# Patient Record
Sex: Female | Born: 1960 | Race: White | Hispanic: No | Marital: Married | Smoking: Never smoker
Health system: Southern US, Community
[De-identification: ages and names within clinical notes are randomized; demographics above are authoritative.]

## PROBLEM LIST (undated history)

## (undated) DIAGNOSIS — A048 Other specified bacterial intestinal infections: Secondary | ICD-10-CM

## (undated) DIAGNOSIS — Z860100 Personal history of colon polyps, unspecified: Secondary | ICD-10-CM

## (undated) DIAGNOSIS — K219 Gastro-esophageal reflux disease without esophagitis: Secondary | ICD-10-CM

## (undated) DIAGNOSIS — I10 Essential (primary) hypertension: Secondary | ICD-10-CM

## (undated) DIAGNOSIS — K2270 Barrett's esophagus without dysplasia: Secondary | ICD-10-CM

## (undated) DIAGNOSIS — T7840XA Allergy, unspecified, initial encounter: Secondary | ICD-10-CM

## (undated) HISTORY — DX: Gastro-esophageal reflux disease without esophagitis: K21.9

## (undated) HISTORY — PX: TUBOPLASTY / TUBOTUBAL ANASTOMOSIS: SUR1392

## (undated) HISTORY — PX: TOTAL ABDOMINAL HYSTERECTOMY W/ BILATERAL SALPINGOOPHORECTOMY: SHX83

## (undated) HISTORY — DX: Personal history of colonic polyps: Z86.010

## (undated) HISTORY — DX: Essential (primary) hypertension: I10

## (undated) HISTORY — PX: DIAGNOSTIC LAPAROSCOPY: SUR761

## (undated) HISTORY — DX: Allergy, unspecified, initial encounter: T78.40XA

## (undated) HISTORY — PX: DILATION AND CURETTAGE OF UTERUS: SHX78

## (undated) HISTORY — PX: OTHER SURGICAL HISTORY: SHX169

## (undated) HISTORY — DX: Personal history of colon polyps, unspecified: Z86.0100

## (undated) HISTORY — DX: Other specified bacterial intestinal infections: A04.8

## (undated) HISTORY — DX: Barrett's esophagus without dysplasia: K22.70

## (undated) HISTORY — PX: ABDOMINAL HYSTERECTOMY: SHX81

---

## 1998-02-13 ENCOUNTER — Inpatient Hospital Stay (HOSPITAL_COMMUNITY): Admission: AD | Admit: 1998-02-13 | Discharge: 1998-02-13 | Payer: Self-pay | Admitting: Obstetrics and Gynecology

## 1998-03-04 ENCOUNTER — Ambulatory Visit (HOSPITAL_COMMUNITY): Admission: RE | Admit: 1998-03-04 | Discharge: 1998-03-04 | Payer: Self-pay | Admitting: Obstetrics and Gynecology

## 1998-03-09 ENCOUNTER — Ambulatory Visit (HOSPITAL_COMMUNITY): Admission: RE | Admit: 1998-03-09 | Discharge: 1998-03-09 | Payer: Self-pay | Admitting: Obstetrics and Gynecology

## 1998-03-12 ENCOUNTER — Inpatient Hospital Stay (HOSPITAL_COMMUNITY): Admission: AD | Admit: 1998-03-12 | Discharge: 1998-03-12 | Payer: Self-pay | Admitting: Obstetrics and Gynecology

## 1998-03-13 ENCOUNTER — Ambulatory Visit (HOSPITAL_COMMUNITY): Admission: RE | Admit: 1998-03-13 | Discharge: 1998-03-13 | Payer: Self-pay | Admitting: Obstetrics and Gynecology

## 1998-03-29 ENCOUNTER — Other Ambulatory Visit: Admission: RE | Admit: 1998-03-29 | Discharge: 1998-03-29 | Payer: Self-pay | Admitting: Obstetrics and Gynecology

## 1998-04-30 ENCOUNTER — Ambulatory Visit (HOSPITAL_COMMUNITY): Admission: AD | Admit: 1998-04-30 | Discharge: 1998-04-30 | Payer: Self-pay | Admitting: Obstetrics and Gynecology

## 1998-05-03 ENCOUNTER — Inpatient Hospital Stay (HOSPITAL_COMMUNITY): Admission: AD | Admit: 1998-05-03 | Discharge: 1998-05-03 | Payer: Self-pay | Admitting: Obstetrics and Gynecology

## 1998-05-03 ENCOUNTER — Ambulatory Visit (HOSPITAL_COMMUNITY): Admission: RE | Admit: 1998-05-03 | Discharge: 1998-05-03 | Payer: Self-pay | Admitting: Obstetrics and Gynecology

## 1998-05-10 ENCOUNTER — Inpatient Hospital Stay (HOSPITAL_COMMUNITY): Admission: AD | Admit: 1998-05-10 | Discharge: 1998-05-10 | Payer: Self-pay | Admitting: Obstetrics and Gynecology

## 1998-06-28 ENCOUNTER — Ambulatory Visit (HOSPITAL_COMMUNITY): Admission: RE | Admit: 1998-06-28 | Discharge: 1998-06-28 | Payer: Self-pay | Admitting: Obstetrics and Gynecology

## 1998-06-28 ENCOUNTER — Inpatient Hospital Stay (HOSPITAL_COMMUNITY): Admission: AD | Admit: 1998-06-28 | Discharge: 1998-06-28 | Payer: Self-pay | Admitting: Obstetrics and Gynecology

## 1998-07-05 ENCOUNTER — Inpatient Hospital Stay (HOSPITAL_COMMUNITY): Admission: AD | Admit: 1998-07-05 | Discharge: 1998-07-05 | Payer: Self-pay | Admitting: Obstetrics and Gynecology

## 1998-08-16 ENCOUNTER — Encounter: Admission: RE | Admit: 1998-08-16 | Discharge: 1998-11-14 | Payer: Self-pay | Admitting: Gynecology

## 1998-08-22 ENCOUNTER — Ambulatory Visit (HOSPITAL_COMMUNITY): Admission: RE | Admit: 1998-08-22 | Discharge: 1998-08-22 | Payer: Self-pay | Admitting: Gynecology

## 1998-10-30 ENCOUNTER — Ambulatory Visit (HOSPITAL_COMMUNITY): Admission: RE | Admit: 1998-10-30 | Discharge: 1998-10-30 | Payer: Self-pay | Admitting: Obstetrics and Gynecology

## 1998-11-02 ENCOUNTER — Ambulatory Visit (HOSPITAL_COMMUNITY): Admission: RE | Admit: 1998-11-02 | Discharge: 1998-11-02 | Payer: Self-pay | Admitting: Obstetrics and Gynecology

## 1998-11-03 ENCOUNTER — Inpatient Hospital Stay (HOSPITAL_COMMUNITY): Admission: RE | Admit: 1998-11-03 | Discharge: 1998-11-03 | Payer: Self-pay | Admitting: Obstetrics and Gynecology

## 1998-11-10 ENCOUNTER — Inpatient Hospital Stay (HOSPITAL_COMMUNITY): Admission: AD | Admit: 1998-11-10 | Discharge: 1998-11-10 | Payer: Self-pay | Admitting: Obstetrics and Gynecology

## 1998-11-23 ENCOUNTER — Other Ambulatory Visit: Admission: RE | Admit: 1998-11-23 | Discharge: 1998-11-23 | Payer: Self-pay | Admitting: Obstetrics and Gynecology

## 1998-11-29 ENCOUNTER — Ambulatory Visit (HOSPITAL_COMMUNITY): Admission: RE | Admit: 1998-11-29 | Discharge: 1998-11-29 | Payer: Self-pay | Admitting: Obstetrics and Gynecology

## 1998-12-04 ENCOUNTER — Ambulatory Visit (HOSPITAL_COMMUNITY): Admission: RE | Admit: 1998-12-04 | Discharge: 1998-12-04 | Payer: Self-pay | Admitting: Obstetrics and Gynecology

## 1998-12-04 ENCOUNTER — Inpatient Hospital Stay (HOSPITAL_COMMUNITY): Admission: AD | Admit: 1998-12-04 | Discharge: 1998-12-04 | Payer: Self-pay | Admitting: Obstetrics and Gynecology

## 1998-12-11 ENCOUNTER — Inpatient Hospital Stay (HOSPITAL_COMMUNITY): Admission: AD | Admit: 1998-12-11 | Discharge: 1998-12-11 | Payer: Self-pay | Admitting: Obstetrics and Gynecology

## 1998-12-27 ENCOUNTER — Ambulatory Visit (HOSPITAL_COMMUNITY): Admission: RE | Admit: 1998-12-27 | Discharge: 1998-12-27 | Payer: Self-pay | Admitting: Obstetrics and Gynecology

## 1998-12-30 ENCOUNTER — Ambulatory Visit (HOSPITAL_COMMUNITY): Admission: RE | Admit: 1998-12-30 | Discharge: 1998-12-30 | Payer: Self-pay | Admitting: Obstetrics and Gynecology

## 1998-12-31 ENCOUNTER — Inpatient Hospital Stay (HOSPITAL_COMMUNITY): Admission: AD | Admit: 1998-12-31 | Discharge: 1998-12-31 | Payer: Self-pay | Admitting: Obstetrics and Gynecology

## 1999-02-04 ENCOUNTER — Inpatient Hospital Stay (HOSPITAL_COMMUNITY): Admission: AD | Admit: 1999-02-04 | Discharge: 1999-02-04 | Payer: Self-pay | Admitting: Obstetrics and Gynecology

## 1999-02-07 ENCOUNTER — Other Ambulatory Visit: Admission: RE | Admit: 1999-02-07 | Discharge: 1999-02-07 | Payer: Self-pay | Admitting: Gynecology

## 1999-02-14 ENCOUNTER — Encounter: Admission: RE | Admit: 1999-02-14 | Discharge: 1999-05-15 | Payer: Self-pay | Admitting: Gynecology

## 1999-04-24 ENCOUNTER — Encounter: Payer: Self-pay | Admitting: Obstetrics and Gynecology

## 1999-04-24 ENCOUNTER — Inpatient Hospital Stay (HOSPITAL_COMMUNITY): Admission: AD | Admit: 1999-04-24 | Discharge: 1999-04-27 | Payer: Self-pay | Admitting: Gynecology

## 1999-06-10 ENCOUNTER — Inpatient Hospital Stay (HOSPITAL_COMMUNITY): Admission: AD | Admit: 1999-06-10 | Discharge: 1999-08-22 | Payer: Self-pay | Admitting: Gynecology

## 1999-06-10 ENCOUNTER — Encounter: Payer: Self-pay | Admitting: Gynecology

## 1999-06-18 ENCOUNTER — Encounter: Payer: Self-pay | Admitting: Obstetrics and Gynecology

## 1999-06-25 ENCOUNTER — Encounter: Payer: Self-pay | Admitting: Gynecology

## 1999-07-02 ENCOUNTER — Encounter: Payer: Self-pay | Admitting: Gynecology

## 1999-07-09 ENCOUNTER — Encounter: Payer: Self-pay | Admitting: Obstetrics and Gynecology

## 1999-07-16 ENCOUNTER — Encounter: Payer: Self-pay | Admitting: Gynecology

## 1999-07-24 ENCOUNTER — Encounter: Payer: Self-pay | Admitting: Gynecology

## 1999-07-30 ENCOUNTER — Encounter: Payer: Self-pay | Admitting: Gynecology

## 1999-08-06 ENCOUNTER — Encounter: Payer: Self-pay | Admitting: Gynecology

## 1999-08-13 ENCOUNTER — Encounter: Payer: Self-pay | Admitting: Gynecology

## 1999-08-23 ENCOUNTER — Encounter (HOSPITAL_COMMUNITY): Admission: RE | Admit: 1999-08-23 | Discharge: 1999-11-21 | Payer: Self-pay | Admitting: Gynecology

## 1999-10-04 ENCOUNTER — Other Ambulatory Visit: Admission: RE | Admit: 1999-10-04 | Discharge: 1999-10-04 | Payer: Self-pay | Admitting: Gynecology

## 1999-11-15 ENCOUNTER — Observation Stay (HOSPITAL_COMMUNITY): Admission: RE | Admit: 1999-11-15 | Discharge: 1999-11-16 | Payer: Self-pay | Admitting: Gynecology

## 1999-11-15 ENCOUNTER — Encounter (INDEPENDENT_AMBULATORY_CARE_PROVIDER_SITE_OTHER): Payer: Self-pay

## 2001-02-03 ENCOUNTER — Other Ambulatory Visit: Admission: RE | Admit: 2001-02-03 | Discharge: 2001-02-03 | Payer: Self-pay | Admitting: Gynecology

## 2002-03-26 ENCOUNTER — Other Ambulatory Visit: Admission: RE | Admit: 2002-03-26 | Discharge: 2002-03-26 | Payer: Self-pay | Admitting: Gynecology

## 2003-03-23 ENCOUNTER — Other Ambulatory Visit: Admission: RE | Admit: 2003-03-23 | Discharge: 2003-03-23 | Payer: Self-pay | Admitting: Gynecology

## 2003-11-22 ENCOUNTER — Inpatient Hospital Stay (HOSPITAL_COMMUNITY): Admission: RE | Admit: 2003-11-22 | Discharge: 2003-11-25 | Payer: Self-pay | Admitting: Gynecology

## 2003-11-22 ENCOUNTER — Encounter (INDEPENDENT_AMBULATORY_CARE_PROVIDER_SITE_OTHER): Payer: Self-pay | Admitting: Specialist

## 2004-05-07 ENCOUNTER — Other Ambulatory Visit: Admission: RE | Admit: 2004-05-07 | Discharge: 2004-05-07 | Payer: Self-pay | Admitting: Gynecology

## 2004-05-07 ENCOUNTER — Ambulatory Visit (HOSPITAL_COMMUNITY): Admission: RE | Admit: 2004-05-07 | Discharge: 2004-05-07 | Payer: Self-pay | Admitting: Gynecology

## 2005-05-16 ENCOUNTER — Other Ambulatory Visit: Admission: RE | Admit: 2005-05-16 | Discharge: 2005-05-16 | Payer: Self-pay | Admitting: Gynecology

## 2005-05-28 ENCOUNTER — Ambulatory Visit (HOSPITAL_COMMUNITY): Admission: RE | Admit: 2005-05-28 | Discharge: 2005-05-28 | Payer: Self-pay | Admitting: Gynecology

## 2006-06-03 ENCOUNTER — Ambulatory Visit (HOSPITAL_COMMUNITY): Admission: RE | Admit: 2006-06-03 | Discharge: 2006-06-03 | Payer: Self-pay | Admitting: Gynecology

## 2006-06-03 ENCOUNTER — Other Ambulatory Visit: Admission: RE | Admit: 2006-06-03 | Discharge: 2006-06-03 | Payer: Self-pay | Admitting: Gynecology

## 2007-06-09 ENCOUNTER — Ambulatory Visit (HOSPITAL_COMMUNITY): Admission: RE | Admit: 2007-06-09 | Discharge: 2007-06-09 | Payer: Self-pay | Admitting: Gynecology

## 2007-06-09 ENCOUNTER — Other Ambulatory Visit: Admission: RE | Admit: 2007-06-09 | Discharge: 2007-06-09 | Payer: Self-pay | Admitting: Gynecology

## 2008-06-09 ENCOUNTER — Ambulatory Visit (HOSPITAL_COMMUNITY): Admission: RE | Admit: 2008-06-09 | Discharge: 2008-06-09 | Payer: Self-pay | Admitting: Gynecology

## 2008-06-09 ENCOUNTER — Other Ambulatory Visit: Admission: RE | Admit: 2008-06-09 | Discharge: 2008-06-09 | Payer: Self-pay | Admitting: Gynecology

## 2009-06-13 ENCOUNTER — Other Ambulatory Visit: Admission: RE | Admit: 2009-06-13 | Discharge: 2009-06-13 | Payer: Self-pay | Admitting: Gynecology

## 2009-06-13 ENCOUNTER — Ambulatory Visit: Payer: Self-pay | Admitting: Gynecology

## 2009-06-13 ENCOUNTER — Ambulatory Visit (HOSPITAL_COMMUNITY): Admission: RE | Admit: 2009-06-13 | Discharge: 2009-06-13 | Payer: Self-pay | Admitting: Gynecology

## 2009-06-13 ENCOUNTER — Encounter: Payer: Self-pay | Admitting: Gynecology

## 2009-10-25 ENCOUNTER — Ambulatory Visit: Payer: Self-pay | Admitting: Gynecology

## 2010-07-04 ENCOUNTER — Ambulatory Visit: Payer: Self-pay | Admitting: Gynecology

## 2010-07-04 ENCOUNTER — Other Ambulatory Visit: Admission: RE | Admit: 2010-07-04 | Discharge: 2010-07-04 | Payer: Self-pay | Admitting: Gynecology

## 2010-07-04 ENCOUNTER — Ambulatory Visit (HOSPITAL_COMMUNITY): Admission: RE | Admit: 2010-07-04 | Discharge: 2010-07-04 | Payer: Self-pay | Admitting: Gynecology

## 2010-11-22 ENCOUNTER — Ambulatory Visit
Admission: RE | Admit: 2010-11-22 | Discharge: 2010-11-22 | Payer: Self-pay | Source: Home / Self Care | Attending: Gynecology | Admitting: Gynecology

## 2010-12-09 ENCOUNTER — Encounter: Payer: Self-pay | Admitting: Gynecology

## 2011-04-05 NOTE — Op Note (Signed)
NAME:  Desiree Holmes, Desiree Holmes                      ACCOUNT NO.:  [id number]   MEDICAL RECORD NO.:  [id number]                   PATIENT TYPE:  INP   LOCATION:  9199                                 FACILITY:  [facility]   PHYSICIAN:  Juan H. Lily Peer, M.D.             DATE OF BIRTH:  [birth year]   DATE OF PROCEDURE:  11/22/2003  DATE OF DISCHARGE:                                 OPERATIVE REPORT   PREOPERATIVE DIAGNOSES:  1. Dysmenorrhea.  2. Menorrhagia.  3. Leiomyomata uteri.  4. Adenomyosis.  5. Right ovarian cysts.   POSTOPERATIVE DIAGNOSES:  1. Dysmenorrhea.  2. Menorrhagia.  3. Leiomyomata uteri.  4. Adenomyosis.  5. Right ovarian cysts.   ANESTHESIA:  General endotracheal anesthesia.   SURGEON:  Juan H. Lily Peer, M.D.   FIRST ASSISTANT:  Timothy P. Fontaine, M.D.   PROCEDURE PERFORMED:  Total abdominal hysterectomy with bilateral salpingo-  oophorectomy.   INDICATION FOR OPERATION:  The patient is a 50 year old gravida 3, para 1,  AB 2, with chronic pelvic pain, dysmenorrhea, menorrhagia, and left ovarian  cyst.   FINDINGS:  Pelvic adhesions.  Small right hemorrhagic ovarian cyst.  Irregular-shaped uterus.  Normal left ovary.   DESCRIPTION OF OPERATION:  After the patient was adequately counseled she  was taken to the operating room, where she underwent successful general  endotracheal anesthesia.  The patient received 1 g of Cefotan prophylaxis  prior to commencement of the operation.  She also had PAS stockings for DVT  prophylaxis.  The abdomen was prepped and draped in the usual sterile  fashion.  After successful general endotracheal anesthesia, 0.25% Marcaine  was infiltrated in the subcutaneous tissue along the Pfannenstiel incision  site for 10 mL.  The incision was carried down adjacent to a previous  Pfannenstiel scar, and this incision was carried down through the skin and  subcutaneous tissue down to the rectus fascia, whereby a midline nick was  made.  The  fascia was incised in a transverse fashion.  The midline raphe  was entered cautiously, the peritoneal cavity was entered.  The patient was  placed in the Trendelenburg position. O'Connor-O'Sullivan retractors were  placed.  Bilateral pelvic adhesiolysis from the pelvic sidewalls.  Heaney  clamps were utilized to put traction on both triple pedicles.  Identification of both ureters was successful.  The right round ligament was  suture ligated with 0 Vicryl suture and then transected and the anterior  broad ligament was incised to the level of the internal cervical os.  With  the surgeon's finger, the posterior broad ligament was penetrated and the  infundibulopelvic ligament was clamped, cut, and suture ligated, first with  a free tie, followed by a transfixion stitch.  A similar procedure was  carried down on the contralateral side after skeletonization of the uterus.  The remainder of the broad ligament and cardinal ligament were clamped, cut,  and suture ligated with 0 Vicryl suture to the level  of both vaginal  fornices, whereby a curved Heaney clamp was utilized to incise the vaginal-  cervical region.  With the use of Jorgenson scissors, the cervix was removed  off the vagina and the cervix, uterus, tubes, and ovaries were passed off  the operative field.  Both angles were secured with a transfixion stitch of  0 Vicryl suture and the remaining vaginal cuff was closed with interrupted  stitch of 0 Vicryl suture.  The pelvic cavity was then copiously irrigated  with normal saline solution.  Inspection of the vaginal cuff demonstrated  hemostasis and both infundibulopelvic ligaments appeared to be intact with  good hemostasis.  Sponges were removed.  Sponge count and needle count were  correct.  The patient was reversed from Trendelenburg position.  The  visceral peritoneum was not reapproximated.  The rectus fascia was closed  with a running stitch of 0 Vicryl suture.  The subcutaneous  bleeders were  Bovie cauterized and the skin was reapproximated with skin clips followed by  placement of Xeroform gauze and 4 x 8 dressing.  The patient was then  extubated and transferred to the recovery room with stable vital signs.  Blood loss for the procedure was 300 mL, IV fluids was 2300 mL of lactated  Ringer's, urine output was 225 mL.                                               Juan H. Lily Peer, M.D.    JHF/MEDQ  D:  11/22/2003  T:  11/22/2003  Job:  [id number]

## 2011-04-05 NOTE — Discharge Summary (Signed)
NAME:  Desiree Holmes, Desiree Holmes                      ACCOUNT NO.:  [id number]   MEDICAL RECORD NO.:  [id number]                   PATIENT TYPE:  INP   LOCATION:  9319                                 [facility]ILITY:  [facility]   PHYSICIAN:  Juan H. Lily Peer, M.D.             DATE OF BIRTH:  Feb 16, 1961   DATE OF ADMISSION:  11/22/2003  DATE OF DISCHARGE:  11/25/2003                                 DISCHARGE SUMMARY   PREOPERATIVE DIAGNOSIS:  Dysmenorrhea, menorrhagia, leiomyomata uteri,  endometriosis, right ovarian cyst.   POSTOPERATIVE DIAGNOSES:  1. Dysmenorrhea, menorrhagia, leiomyomata uteri, endometriosis, right     ovarian cyst status post total abdominal hysterectomy with bilateral     salpingo-oophorectomy with Dr. Reynaldo Minium on November 22, 2003.  2. Bronchitis.   HISTORY:  This is a 42-years-of-age female gravida 3 para 1 aborta 2 with  longstanding history of chronic pelvic pain, dysmenorrhea, and menorrhagia.  She was found to have uterine fibroids as well as ovarian cyst and the  patient desired definitive surgery.   HOSPITAL COURSE:  On November 22, 2003 the patient underwent a total abdominal  hysterectomy with bilateral salpingo-oophorectomy without complications by  Dr. Reynaldo Minium and was found to have pelvic adhesions, small right  hemorrhagic ovarian cyst, a regular-shaped uterus, and normal left ovary.  Postoperatively the patient remained afebrile, voiding, in stable condition  until November 25, 2003 on a.m. rounds the physician had found the patient had  inspiratory rhonchi and wheezes on both lung bases.  Upon questioning the  patient she stated she had a URI prior to surgery.  Chest x-ray PA and  lateral had been performed and showed mid lower lung field atelectasis and  the patient was begun on nebulizer breathing treatments as well as albuterol  and Z-Pak.  The patient otherwise was felt to be in satisfactory condition  for discharge home and was discharged to home.   ACCESSORY CLINICAL FINDINGS/LABORATORY DATA:  On November 22, 2003 hemoglobin  was 12.1.  On November 23, 2003 hemoglobin was 10.4; white count 12,500.   DISPOSITION:  1. The patient was discharged to home.  2. She was given Climara 0.1 mg transdermal patch to apply weekly.  3. She was given Z-Pak 250 mg p.o. daily for 4 days.  4. Humibid LA one p.o. q.12h.  5. Lortab 7.5/500 one p.o. q.4-6h.  6. She was to use incentive spirometer.  7. She was to follow up in the office on the following Wednesday; if she had     any problems prior to that time to be seen in the office.     Susa Loffler, P.A.                    Juan H. Lily Peer, M.D.    TSG/MEDQ  D:  12/26/2003  T:  12/26/2003  Job:  [id number]

## 2011-04-05 NOTE — H&P (Signed)
NAME:  Desiree Holmes, Desiree Holmes                      ACCOUNT NO.:  [id number]   MEDICAL RECORD NO.:  [id number]                   PATIENT TYPE:  AMB   LOCATION:  DFTL                                 [facility]   PHYSICIAN:  Juan H. Lily Peer, M.D.             DATE OF BIRTH:  January 12, 1961   DATE OF ADMISSION:  DATE OF DISCHARGE:                                HISTORY & PHYSICAL   ANTICIPATED DATE OF SURGERY:  The patient is scheduled for surgery Tuesday,  January 04th, at 7:30 A.M. at [facility].   CHIEF COMPLAINT:  Dysmenorrhea, menorrhagia, pelvic pain, uterine fibroids,  and ovarian cysts.   HISTORY:  The patient is a 50 year old gravida 3, para 1 AB 2 with a  longstanding history of chronic pelvic pain, dysmenorrhea and menorrhagia.  The patient has been placed on various nonsteroidals as well as  attempted  on oral contraceptive pill, but due to the fact it was causing her blood  pressure to be elevated she opted to discontinue it.  She has had a tubal  sterilization in the past.  The oral contraceptive pills had been given to  her in an effort to regulate her cycles.  She had gone through an  endometrial biopsy and sonohysterogram on November 02nd.  The endometrial  biopsy demonstrated benign early secretory endometrium, no hyperplasia or  malignancy,  the ul had demonstrated that was measuring 8 x 4.8 x 5.1 mm  with an endometrial stripe of 11.8 mm, and a fundal intramural myoma was  noted measuring 13 x 12 mm, and multiple small cystic areas in the  endometrial cavity suggesting adenomyosis.  She did have a thick-walled cyst  n the right ovary measuring 15 x 14 x 15 mm with increased vascular flow in  the periphery of the cyst.  The left ovary was normal.  Th sonohysterogram  demonstrated posterior wall prominent.  The endometrial cavity was noted  with a cystic focus within the cavity, with a thickened endometrial cavity.  Her recent Pap smear in May was reported  to be normal as well.   The patient has been offered treatment option to include endometrial  ablation and she opted to proceed with definitive surgery such as a  hysterectomy, and since she will be 50 years of age this year, she had opted  also for bilateral salpingo-oophorectomy being fully warned that she will  need to be on hormone replacement therapy to offset potential vasomotor  symptoms; and, she is willing to proceed with that.  She did have a recent  mammogram in May of this year, which was normal; and, as part of her workup  for dysfunctional uterine bleeding she did have a normal TSH and prolactin,  and she had been on iron supplementation with normal CBC.   PAST HISTORY:  1. The patient has had tubal ligation in the past.  2. The patient has had one normal spontaneous delivery,  premature.  3. The patient has had diagnostic laparoscopy.  4. The patient has had tuboplasty.  5. The patient has had D&C.  6. The patient has had a mini lap for her sterilization.  7. 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   FAMILY HISTORY:  Maternal grandmother with diabetes  paternal grandparents  with cardiovascular disease and sister with diagnosed lupus.   ALLERGIES:  The patient is allergic to SULFA.   PHYSICAL EXAMINATION:  VITAL SIGNS:  The patient weighs 190 pounds.  Blood  pressure is 114/72.  HEENT:  Unremarkable.  NECK:  Neck supple.  Trachea midline.  No carotid bruits.  No thyromegaly.  LUNGS:  Lungs clear to auscultation without rhonchi or wheezes.  HEART:  Regular rate and rhythm.  No murmurs or gallops.  BREASTS:  Breasts are symmetrical in appearance.  No skin discoloration or  nipple inversion.  No palpable masses or tenderness.  No supraclavicular or  axillary lymphadenopathy.  ABDOMEN:  The abdomen is soft and nontender without rebound or guarding.  A  small Pfannenstiel scar noted.  PELVIC:  Bartholin, urethra and Skene gland within normal  limits.  Vagina  and cervix; no gross lesions on inspection.  Uterus slightly anteverted,  normal size, shape and consistency.  Adnexa without any masses or  tenderness.   ASSESSMENT:  [age]-year-old gravida 3, para 1, AB 2 with a history of  dysmenorrhea, menorrhagia, pelvic pain, previous abdominal surgeries, and  pain probably consistent, based on findings from ultrasound, and making one  suspicious of the possibility of underlying adenomyosis.   Due to the patient's multiple operations it was decided to proceed with a  laparotomy technique.  Originally had been discussed possibly a  laparoscopically assisted vaginal hysterectomy, but for the patient's best  interest and safety it was decided to proceed with an abdominal total  abdominal hysterectomy and bilateral salpingo-oophorectomy.  She also wishes  to have her ovaries removed and we discussed the risks, benefits, pros and  cons of leaving the ovaries versus removing them at this point, and the need  for hormone replacement therapy with its potential risks as well as Women's  Health __________ Trial had been discussed.  The patient would like to have  both ovaries removed.   Also risks of the operation to include trauma to internal organs requiring  reparative surgery at that point, the risks of infection although she will  receive prophylactic antibiotic, the risks of hemorrhage and in the event of  the need for blood and blood products, the patient is fully aware of  potential risk to include anaphylactic reaction, hepatitis and AIDS, and  also in an effort to prevent deep venous thrombosis she will have pneumatic  compression stockings.  Anesthesia to discuss their risks from anesthesia at  the counseling session.  All the above were discussed with the patient an  all questions were answered; and, we will follow accordingly.  PLAN:  The patient is scheduled for total abdominal hysterectomy with  bilateral  salpingo-oophorectomy Tuesday, January 04th at 7:30 A.M. at  [facility].                                               Otsego. Lily Peer, M.D.    JHF/MEDQ  D:  11/21/2003  T:  11/22/2003  Job:  [id number]

## 2011-05-27 ENCOUNTER — Encounter: Payer: Self-pay | Admitting: *Deleted

## 2011-05-29 ENCOUNTER — Other Ambulatory Visit: Payer: Self-pay | Admitting: Gynecology

## 2011-05-29 DIAGNOSIS — Z1231 Encounter for screening mammogram for malignant neoplasm of breast: Secondary | ICD-10-CM

## 2011-07-09 ENCOUNTER — Other Ambulatory Visit (HOSPITAL_COMMUNITY)
Admission: RE | Admit: 2011-07-09 | Discharge: 2011-07-09 | Disposition: A | Discharge status: Home / Self Care | Payer: BC Managed Care – PPO | Source: Ambulatory Visit | Attending: Gynecology | Admitting: Gynecology

## 2011-07-09 ENCOUNTER — Encounter: Payer: Self-pay | Admitting: Gynecology

## 2011-07-09 ENCOUNTER — Ambulatory Visit (HOSPITAL_COMMUNITY)
Admission: RE | Admit: 2011-07-09 | Discharge: 2011-07-09 | Disposition: A | Discharge status: Home / Self Care | Payer: BC Managed Care – PPO | Source: Ambulatory Visit | Attending: Gynecology | Admitting: Gynecology

## 2011-07-09 ENCOUNTER — Ambulatory Visit (HOSPITAL_COMMUNITY): Payer: BC Managed Care – PPO

## 2011-07-09 ENCOUNTER — Ambulatory Visit (INDEPENDENT_AMBULATORY_CARE_PROVIDER_SITE_OTHER): Payer: BC Managed Care – PPO | Admitting: Gynecology

## 2011-07-09 DIAGNOSIS — Z1211 Encounter for screening for malignant neoplasm of colon: Secondary | ICD-10-CM

## 2011-07-09 DIAGNOSIS — Z01419 Encounter for gynecological examination (general) (routine) without abnormal findings: Secondary | ICD-10-CM | POA: Insufficient documentation

## 2011-07-09 DIAGNOSIS — N951 Menopausal and female climacteric states: Secondary | ICD-10-CM

## 2011-07-09 DIAGNOSIS — Z1231 Encounter for screening mammogram for malignant neoplasm of breast: Secondary | ICD-10-CM | POA: Insufficient documentation

## 2011-07-09 MED ORDER — EST ESTROGENS-METHYLTEST 0.625-1.25 MG PO TABS: 1.2500 | ORAL_TABLET | Freq: Every day | ORAL | Status: DC

## 2011-07-09 NOTE — Progress Notes (Signed)
Desiree Holmes 22-Jun-1961 [id number]   History:    50 y.o. gravida 2 para 1 AB 1 (TAH/BSO) presented to the office for her annual gynecological examination. Her main complaint today was she is suffering from vasomotor symptoms worse every 3 weeks. She is currently on Estratest 0.625 mg daily. She is being followed by her health care provider in [place], [place] who did all her labs in June and who have been monitoring her for hypertension for which she is currently on hypertensive agent she does not recall the name. She had her mammogram today and does her monthly self breast examinations. Her last colonoscopy was in 2010 benign colonic polyps were reported. Her last bone density study was this year which was normal.  Past medical history,surgical history, family history and social history were all reviewed and documented in the EPIC chart. ROS:  Was performed and pertinent positives and negatives are included in the history.  Exam: chaperone present Filed Vitals:   07/09/11 1538  BP: 140/92   @WEIGHT @ Body mass index is 40.18 kg/(m^2).  General appearance : Well developed well nourished female. Skin grossly normal HEENT: Neck supple, trachea midline Lungs: Clear to auscultation, no rhonchi or wheezes Heart: Regular rate and rhythm, no murmurs or gallops Breast:Examined in sitting and supine position were symmetrical in appearance, no palpable masses, to skin retraction, no nipple inversion, no nipple discharge and no axillary or supraclavicular lymphadenopathy Abdomen: no palpable masses or tenderness Pelvic  Ext/BUS/vagina  normal   Cervix  normal   Uterus  absent,   Adnexa  Without masses or tenderness  Anus and perineum  normal   Rectovaginal  normal sphincter tone without palpated masses or tenderness             Hemoccult done results pending at time of this dictation     Assessment/Plan:  50 y.o. female for annual exam with a worsening vasomotor symptoms despite been  on estrogen replacement therapy consisting of Estratest 0.625 mg daily. We will increase her dose to Estratest 1.25 mg by mouth daily. The risks benefits and pros and cons of hormone replacement therapy were discussed as well as the women's health initiative studies. Patient will continue her monthly self breast examination she scheduled for mammogram later today. She is encouraged to take her calcium and vitamin D for osteoporosis prevention and take her antihypertensive medication that she had not taken today and for this reason her blood pressure was found to be elevated at 138/92 on repeat today. She was otherwise asymptomatic.    Ok Edwards MD, 4:52 PM 07/09/2011

## 2011-08-05 ENCOUNTER — Telehealth: Payer: Self-pay | Admitting: *Deleted

## 2011-08-05 NOTE — Telephone Encounter (Signed)
Message copied by Aura Camps on Mon Aug 05, 2011  4:11 PM ------      Message from: Ok Edwards      Created: Mon Aug 05, 2011  3:55 PM       Desiree Holmes, I reviewed my last note after patient's last visit and she should be on the Estratest 1.25 mg daily. Please: A prescription for 30 tablets with 11 refills. Thank you

## 2011-08-05 NOTE — Telephone Encounter (Signed)
Pt informed with the below. 

## 2011-08-05 NOTE — Telephone Encounter (Signed)
Pt called stating her dose for estratest hs 0.625-1.25mg  should be a higher dose. Pt says this is the same dose she was on previously before. She said that you spoke about a dose increase. She spoke with the pharmacy and they told her she could take two, but pt states this will be more money. Please advise.

## 2011-08-06 ENCOUNTER — Telehealth: Payer: Self-pay | Admitting: *Deleted

## 2011-08-06 MED ORDER — ESTRATEST 1.25-2.5 MG PO TABS: 1.2500 mg | ORAL_TABLET | Freq: Every day | ORAL | Status: DC

## 2011-08-06 NOTE — Telephone Encounter (Signed)
Patient had left message that new prescription was not sent in for Estratest 1.25 generic.  Escribed and patient informed done.

## 2012-06-09 ENCOUNTER — Other Ambulatory Visit: Payer: Self-pay | Admitting: Gynecology

## 2012-06-09 DIAGNOSIS — Z1231 Encounter for screening mammogram for malignant neoplasm of breast: Secondary | ICD-10-CM

## 2012-07-14 ENCOUNTER — Ambulatory Visit (INDEPENDENT_AMBULATORY_CARE_PROVIDER_SITE_OTHER): Payer: BC Managed Care – PPO | Admitting: Gynecology

## 2012-07-14 ENCOUNTER — Ambulatory Visit (HOSPITAL_COMMUNITY)
Admission: RE | Admit: 2012-07-14 | Discharge: 2012-07-14 | Disposition: A | Discharge status: Home / Self Care | Payer: BC Managed Care – PPO | Source: Ambulatory Visit | Attending: Gynecology | Admitting: Gynecology

## 2012-07-14 ENCOUNTER — Encounter: Payer: Self-pay | Admitting: Gynecology

## 2012-07-14 VITALS — BP 130/86 | Ht 62.5 in | Wt 230.0 lb

## 2012-07-14 DIAGNOSIS — N9089 Other specified noninflammatory disorders of vulva and perineum: Secondary | ICD-10-CM | POA: Insufficient documentation

## 2012-07-14 DIAGNOSIS — Z1231 Encounter for screening mammogram for malignant neoplasm of breast: Secondary | ICD-10-CM | POA: Insufficient documentation

## 2012-07-14 DIAGNOSIS — R635 Abnormal weight gain: Secondary | ICD-10-CM

## 2012-07-14 DIAGNOSIS — L819 Disorder of pigmentation, unspecified: Secondary | ICD-10-CM

## 2012-07-14 DIAGNOSIS — Z01419 Encounter for gynecological examination (general) (routine) without abnormal findings: Secondary | ICD-10-CM

## 2012-07-14 MED ORDER — EST ESTROGENS-METHYLTEST 1.25-2.5 MG PO TABS: 1.0000 | ORAL_TABLET | Freq: Every day | ORAL | Status: DC

## 2012-07-14 NOTE — Progress Notes (Signed)
Desiree Holmes October 13, 1961 [id number]   History:    51 y.o.  for annual gyn exam with a complaint of some lesions that she noticed on her periclitoral region as well as on her inguinal crease area. Review of her record indicated that she had a total abdominal hysterectomy bilateral salpingo-oophorectomy back in 2005 and has been on Estratest 1.25 mg daily for her vasomotor symptoms. She had attempted several years ago did decrease the dose but could not tolerate the symptoms. Past medical history,surgical history, family history and social history were all reviewed and documented in the EPIC chart. Her last mammogram was in August of 2012. She does her monthly self breast examination. Her primary physician in [place], [place]. has been treating her for her H. pylori as well as for her vitamin D deficiency.  Gynecologic History No LMP recorded. Patient has had a hysterectomy. Contraception: Hysterectomy Last Pap: 2012. Results were: normal Last mammogram: 2012. Results were: normal  Obstetric History OB History    Grav Para Term Preterm Abortions TAB SAB Ect Mult Living   3 1  1 2  2   1      # Outc Date GA Lbr Len/2nd Wgt Sex Del Anes PTL Lv   1 PRE     F SVD  Yes Yes   2 SAB            3 SAB                ROS: A ROS was performed and pertinent positives and negatives are included in the history.  GENERAL: No fevers or chills. HEENT: No change in vision, no earache, sore throat or sinus congestion. NECK: No pain or stiffness. CARDIOVASCULAR: No chest pain or pressure. No palpitations. PULMONARY: No shortness of breath, cough or wheeze. GASTROINTESTINAL: No abdominal pain, nausea, vomiting or diarrhea, melena or bright red blood per rectum. GENITOURINARY: No urinary frequency, urgency, hesitancy or dysuria. MUSCULOSKELETAL: No joint or muscle pain, no back pain, no recent trauma. DERMATOLOGIC: No rash, no itching, no lesions. ENDOCRINE: No polyuria, polydipsia, no heat or cold intolerance. No  recent change in weight. HEMATOLOGICAL: No anemia or easy bruising or bleeding. NEUROLOGIC: No headache, seizures, numbness, tingling or weakness. PSYCHIATRIC: No depression, no loss of interest in normal activity or change in sleep pattern.     Exam: chaperone present  BP 130/86  Ht 5' 2.5" (1.588 m)  Wt 230 lb (104.327 kg)  BMI 41.40 kg/m2  Body mass index is 41.40 kg/(m^2).  General appearance : Well developed well nourished female. No acute distress HEENT: Neck supple, trachea midline, no carotid bruits, no thyroidmegaly Lungs: Clear to auscultation, no rhonchi or wheezes, or rib retractions  Heart: Regular rate and rhythm, no murmurs or gallops Breast:Examined in sitting and supine position were symmetrical in appearance, no palpable masses or tenderness,  no skin retraction, no nipple inversion, no nipple discharge, no skin discoloration, no axillary or supraclavicular lymphadenopathy Abdomen: no palpable masses or tenderness, no rebound or guarding, right inguinal crease she had 2 dark hyperpigmented lesions Extremities: no edema or skin discoloration or tenderness  Pelvic:  Bartholin, Urethra, Skene Glands: Within normal limits, 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 Patient will return back to the office later in the week for excision biopsy. She was reminded schedule her mammogram. Her labs will be drawn by her physician back home. No labs will be drawn today. She will continue with her calcium and  vitamin D as well as encouraged to engage in weightbearing exercises as well as to help her lose weight. Literature formation on exercise and weight reduction diet provided. She will schedule her bone density study in the next couple weeks as well. We did discuss the new Pap smear screening guidelines. No Pap smear done today. Her colonoscopy is due in 2 years and she was reminded to submit to the office Hemoccult cards for testing.    Ok Edwards MD, 4:23 PM 07/14/2012

## 2012-07-14 NOTE — Patient Instructions (Addendum)
Health Maintenance, Females A healthy lifestyle and preventative care can promote health and wellness.  Maintain regular health, dental, and eye exams.   Eat a healthy diet. Foods like vegetables, fruits, whole grains, low-fat dairy products, and lean protein foods contain the nutrients you need without too many calories. Decrease your intake of foods high in solid fats, added sugars, and salt. Get information about a proper diet from your caregiver, if necessary.   Regular physical exercise is one of the most important things you can do for your health. Most adults should get at least 150 minutes of moderate-intensity exercise (any activity that increases your heart rate and causes you to sweat) each week. In addition, most adults need muscle-strengthening exercises on 2 or more days a week.    Maintain a healthy weight. The body mass index (BMI) is a screening tool to identify possible weight problems. It provides an estimate of body fat based on height and weight. Your caregiver can help determine your BMI, and can help you achieve or maintain a healthy weight. For adults 20 years and older:   A BMI below 18.5 is considered underweight.   A BMI of 18.5 to 24.9 is normal.   A BMI of 25 to 29.9 is considered overweight.   A BMI of 30 and above is considered obese.   Maintain normal blood lipids and cholesterol by exercising and minimizing your intake of saturated fat. Eat a balanced diet with plenty of fruits and vegetables. Blood tests for lipids and cholesterol should begin at age 20 and be repeated every 5 years. If your lipid or cholesterol levels are high, you are over 50, or you are a high risk for heart disease, you may need your cholesterol levels checked more frequently.Ongoing high lipid and cholesterol levels should be treated with medicines if diet and exercise are not effective.   If you smoke, find out from your caregiver how to quit. If you do not use tobacco, do not start.    If you are pregnant, do not drink alcohol. If you are breastfeeding, be very cautious about drinking alcohol. If you are not pregnant and choose to drink alcohol, do not exceed 1 drink per day. One drink is considered to be 12 ounces (355 mL) of beer, 5 ounces (148 mL) of wine, or 1.5 ounces (44 mL) of liquor.   Avoid use of street drugs. Do not share needles with anyone. Ask for help if you need support or instructions about stopping the use of drugs.   High blood pressure causes heart disease and increases the risk of stroke. Blood pressure should be checked at least every 1 to 2 years. Ongoing high blood pressure should be treated with medicines, if weight loss and exercise are not effective.   If you are 55 to 53 years old, ask your caregiver if you should take aspirin to prevent strokes.   Diabetes screening involves taking a blood sample to check your fasting blood sugar level. This should be done once every 3 years, after age 45, if you are within normal weight and without risk factors for diabetes. Testing should be considered at a younger age or be carried out more frequently if you are overweight and have at least 1 risk factor for diabetes.   Breast cancer screening is essential preventative care for women. You should practice "breast self-awareness." This means understanding the normal appearance and feel of your breasts and may include breast self-examination. Any changes detected, no matter how   small, should be reported to a caregiver. Women in their 20s and 30s should have a clinical breast exam (CBE) by a caregiver as part of a regular health exam every 1 to 3 years. After age 40, women should have a CBE every year. Starting at age 40, women should consider having a mammogram (breast X-ray) every year. Women who have a family history of breast cancer should talk to their caregiver about genetic screening. Women at a high risk of breast cancer should talk to their caregiver about having  an MRI and a mammogram every year.   The Pap test is a screening test for cervical cancer. Women should have a Pap test starting at age 21. Between ages 21 and 29, Pap tests should be repeated every 2 years. Beginning at age 30, you should have a Pap test every 3 years as long as the past 3 Pap tests have been normal. If you had a hysterectomy for a problem that was not cancer or a condition that could lead to cancer, then you no longer need Pap tests. If you are between ages 65 and 70, and you have had normal Pap tests going back 10 years, you no longer need Pap tests. If you have had past treatment for cervical cancer or a condition that could lead to cancer, you need Pap tests and screening for cancer for at least 20 years after your treatment. If Pap tests have been discontinued, risk factors (such as a new sexual partner) need to be reassessed to determine if screening should be resumed. Some women have medical problems that increase the chance of getting cervical cancer. In these cases, your caregiver may recommend more frequent screening and Pap tests.   The human papillomavirus (HPV) test is an additional test that may be used for cervical cancer screening. The HPV test looks for the virus that can cause the cell changes on the cervix. The cells collected during the Pap test can be tested for HPV. The HPV test could be used to screen women aged 30 years and older, and should be used in women of any age who have unclear Pap test results. After the age of 30, women should have HPV testing at the same frequency as a Pap test.   Colorectal cancer can be detected and often prevented. Most routine colorectal cancer screening begins at the age of 50 and continues through age 75. However, your caregiver may recommend screening at an earlier age if you have risk factors for colon cancer. On a yearly basis, your caregiver may provide home test kits to check for hidden blood in the stool. Use of a small camera at  the end of a tube, to directly examine the colon (sigmoidoscopy or colonoscopy), can detect the earliest forms of colorectal cancer. Talk to your caregiver about this at age 50, when routine screening begins. Direct examination of the colon should be repeated every 5 to 10 years through age 75, unless early forms of pre-cancerous polyps or small growths are found.   Hepatitis C blood testing is recommended for all people born from 1945 through 1965 and any individual with known risks for hepatitis C.   Practice safe sex. Use condoms and avoid high-risk sexual practices to reduce the spread of sexually transmitted infections (STIs). Sexually active women aged 25 and younger should be checked for Chlamydia, which is a common sexually transmitted infection. Older women with new or multiple partners should also be tested for Chlamydia. Testing for other   STIs is recommended if you are sexually active and at increased risk.   Osteoporosis is a disease in which the bones lose minerals and strength with aging. This can result in serious bone fractures. The risk of osteoporosis can be identified using a bone density scan. Women ages 65 and over and women at risk for fractures or osteoporosis should discuss screening with their caregivers. Ask your caregiver whether you should be taking a calcium supplement or vitamin D to reduce the rate of osteoporosis.   Menopause can be associated with physical symptoms and risks. Hormone replacement therapy is available to decrease symptoms and risks. You should talk to your caregiver about whether hormone replacement therapy is right for you.   Use sunscreen with a sun protection factor (SPF) of 30 or greater. Apply sunscreen liberally and repeatedly throughout the day. You should seek shade when your shadow is shorter than you. Protect yourself by wearing long sleeves, pants, a wide-brimmed hat, and sunglasses year round, whenever you are outdoors.   Notify your caregiver  of new moles or changes in moles, especially if there is a change in shape or color. Also notify your caregiver if a mole is larger than the size of a pencil eraser.   Stay current with your immunizations.  Document Released: 05/20/2011 Document Revised: 10/24/2011 Document Reviewed: 05/20/2011 [employer] Patient Information 2012 [employer].                                                   Cholesterol Control Diet  Cholesterol levels in your body are determined significantly by your diet. Cholesterol levels may also be related to heart disease. The following material helps to explain this relationship and discusses what you can do to help keep your heart healthy. Not all cholesterol is bad. Low-density lipoprotein (LDL) cholesterol is the "bad" cholesterol. It may cause fatty deposits to build up inside your arteries. High-density lipoprotein (HDL) cholesterol is "good." It helps to remove the "bad" LDL cholesterol from your blood. Cholesterol is a very important risk factor for heart disease. Other risk factors are high blood pressure, smoking, stress, heredity, and weight. The heart muscle gets its supply of blood through the coronary arteries. If your LDL cholesterol is high and your HDL cholesterol is low, you are at risk for having fatty deposits build up in your coronary arteries. This leaves less room through which blood can flow. Without sufficient blood and oxygen, the heart muscle cannot function properly and you may feel chest pains (angina pectoris). When a coronary artery closes up entirely, a part of the heart muscle may die, causing a heart attack (myocardial infarction). CHECKING CHOLESTEROL When your caregiver sends your blood to a lab to be analyzed for cholesterol, a complete lipid (fat) profile may be done. With this test, the total amount of cholesterol and levels of LDL and HDL are determined. Triglycerides are a type of fat that circulates in the blood and can also be used to  determine heart disease risk. The list below describes what the numbers should be: Test: Total Cholesterol.  Less than 200 mg/dl.  Test: LDL "bad cholesterol."  Less than 100 mg/dl.   Less than 70 mg/dl if you are at very high risk of a heart attack or sudden cardiac death.  Test: HDL "good cholesterol."  Greater than 50 mg/dl for   women.   Greater than 40 mg/dl for men.  Test: Triglycerides.  Less than 150 mg/dl.  CONTROLLING CHOLESTEROL WITH DIET Although exercise and lifestyle factors are important, your diet is key. That is because certain foods are known to raise cholesterol and others to lower it. The goal is to balance foods for their effect on cholesterol and more importantly, to replace saturated and trans fat with other types of fat, such as monounsaturated fat, polyunsaturated fat, and omega-3 fatty acids. On average, a person should consume no more than 15 to 17 g of saturated fat daily. Saturated and trans fats are considered "bad" fats, and they will raise LDL cholesterol. Saturated fats are primarily found in animal products such as meats, butter, and cream. However, that does not mean you need to sacrifice all your favorite foods. Today, there are good tasting, low-fat, low-cholesterol substitutes for most of the things you like to eat. Choose low-fat or nonfat alternatives. Choose round or loin cuts of red meat, since these types of cuts are lowest in fat and cholesterol. Chicken (without the skin), fish, veal, and ground turkey breast are excellent choices. Eliminate fatty meats, such as hot dogs and salami. Even shellfish have little or no saturated fat. Have a 3 oz (85 g) portion when you eat lean meat, poultry, or fish. Trans fats are also called "partially hydrogenated oils." They are oils that have been scientifically manipulated so that they are solid at room temperature resulting in a longer shelf life and improved taste and texture of foods in which they are added. Trans  fats are found in stick margarine, some tub margarines, cookies, crackers, and baked goods.  When baking and cooking, oils are an excellent substitute for butter. The monounsaturated oils are especially beneficial since it is believed they lower LDL and raise HDL. The oils you should avoid entirely are saturated tropical oils, such as coconut and palm.  Remember to eat liberally from food groups that are naturally free of saturated and trans fat, including fish, fruit, vegetables, beans, grains (barley, rice, couscous, bulgur wheat), and pasta (without cream sauces).  IDENTIFYING FOODS THAT LOWER CHOLESTEROL  Soluble fiber may lower your cholesterol. This type of fiber is found in fruits such as apples, vegetables such as broccoli, potatoes, and carrots, legumes such as beans, peas, and lentils, and grains such as barley. Foods fortified with plant sterols (phytosterol) may also lower cholesterol. You should eat at least 2 g per day of these foods for a cholesterol lowering effect.  Read package labels to identify low-saturated fats, trans fats free, and low-fat foods at the supermarket. Select cheeses that have only 2 to 3 g saturated fat per ounce. Use a heart-healthy tub margarine that is free of trans fats or partially hydrogenated oil. When buying baked goods (cookies, crackers), avoid partially hydrogenated oils. Breads and muffins should be made from whole grains (whole-wheat or whole oat flour, instead of "flour" or "enriched flour"). Buy non-creamy canned soups with reduced salt and no added fats.  FOOD PREPARATION TECHNIQUES  Never deep-fry. If you must fry, either stir-fry, which uses very little fat, or use non-stick cooking sprays. When possible, broil, bake, or roast meats, and steam vegetables. Instead of dressing vegetables with butter or margarine, use lemon and herbs, applesauce and cinnamon (for squash and sweet potatoes), nonfat yogurt, salsa, and low-fat dressings for salads.    LOW-SATURATED FAT / LOW-FAT FOOD SUBSTITUTES Meats / Saturated Fat (g)  Avoid: Steak, marbled (3 oz/85 g) /   11 g   Choose: Steak, lean (3 oz/85 g) / 4 g   Avoid: Hamburger (3 oz/85 g) / 7 g   Choose: Hamburger, lean (3 oz/85 g) / 5 g   Avoid: Ham (3 oz/85 g) / 6 g   Choose: Ham, lean cut (3 oz/85 g) / 2.4 g   Avoid: Chicken, with skin, dark meat (3 oz/85 g) / 4 g   Choose: Chicken, skin removed, dark meat (3 oz/85 g) / 2 g   Avoid: Chicken, with skin, light meat (3 oz/85 g) / 2.5 g   Choose: Chicken, skin removed, light meat (3 oz/85 g) / 1 g  Dairy / Saturated Fat (g)  Avoid: Whole milk (1 cup) / 5 g   Choose: Low-fat milk, 2% (1 cup) / 3 g   Choose: Low-fat milk, 1% (1 cup) / 1.5 g   Choose: Skim milk (1 cup) / 0.3 g   Avoid: Hard cheese (1 oz/28 g) / 6 g   Choose: Skim milk cheese (1 oz/28 g) / 2 to 3 g   Avoid: Cottage cheese, 4% fat (1 cup) / 6.5 g   Choose: Low-fat cottage cheese, 1% fat (1 cup) / 1.5 g   Avoid: Ice cream (1 cup) / 9 g   Choose: Sherbet (1 cup) / 2.5 g   Choose: Nonfat frozen yogurt (1 cup) / 0.3 g   Choose: Frozen fruit bar / trace   Avoid: Whipped cream (1 tbs) / 3.5 g   Choose: Nondairy whipped topping (1 tbs) / 1 g  Condiments / Saturated Fat (g)  Avoid: Mayonnaise (1 tbs) / 2 g   Choose: Low-fat mayonnaise (1 tbs) / 1 g   Avoid: Butter (1 tbs) / 7 g   Choose: Extra light margarine (1 tbs) / 1 g   Avoid: Coconut oil (1 tbs) / 11.8 g   Choose: Olive oil (1 tbs) / 1.8 g   Choose: Corn oil (1 tbs) / 1.7 g   Choose: Safflower oil (1 tbs) / 1.2 g   Choose: Sunflower oil (1 tbs) / 1.4 g   Choose: Soybean oil (1 tbs) / 2.4 g   Choose: Canola oil (1 tbs) / 1 g  Document Released: 11/04/2005 Document Revised: 07/17/2011 Document Reviewed: 04/25/2011 [employer] Patient Information 2012 [employer].  Exercise to Lose Weight Exercise and a healthy diet may help you lose weight. Your doctor may suggest specific  exercises. EXERCISE IDEAS AND TIPS  Choose low-cost things you enjoy doing, such as walking, bicycling, or exercising to workout videos.   Take stairs instead of the elevator.   Walk during your lunch break.   Park your car further away from work or school.   Go to a gym or an exercise class.   Start with 5 to 10 minutes of exercise each day. Build up to 30 minutes of exercise 4 to 6 days a week.   Wear shoes with good support and comfortable clothes.   Stretch before and after working out.   Work out until you breathe harder and your heart beats faster.   Drink extra water when you exercise.   Do not do so much that you hurt yourself, feel dizzy, or get very short of breath.  Exercises that burn about 150 calories:  Running 1  miles in 15 minutes.   Playing volleyball for 45 to 60 minutes.   Washing and waxing a car for 45 to 60 minutes.   Playing touch football   for 45 minutes.   Walking 1  miles in 35 minutes.   Pushing a stroller 1  miles in 30 minutes.   Playing basketball for 30 minutes.   Raking leaves for 30 minutes.   Bicycling 5 miles in 30 minutes.   Walking 2 miles in 30 minutes.   Dancing for 30 minutes.   Shoveling snow for 15 minutes.   Swimming laps for 20 minutes.   Walking up stairs for 15 minutes.   Bicycling 4 miles in 15 minutes.   Gardening for 30 to 45 minutes.   Jumping rope for 15 minutes.   Washing windows or floors for 45 to 60 minutes.  Document Released: 12/07/2010 Document Revised: 07/17/2011 Document Reviewed: 12/07/2010 [employer] Patient Information 2012 [employer].  

## 2012-07-16 ENCOUNTER — Encounter: Payer: Self-pay | Admitting: Gynecology

## 2012-07-16 ENCOUNTER — Ambulatory Visit (INDEPENDENT_AMBULATORY_CARE_PROVIDER_SITE_OTHER): Payer: BC Managed Care – PPO | Admitting: Gynecology

## 2012-07-16 VITALS — BP 132/88

## 2012-07-16 DIAGNOSIS — N9089 Other specified noninflammatory disorders of vulva and perineum: Secondary | ICD-10-CM

## 2012-07-16 DIAGNOSIS — L989 Disorder of the skin and subcutaneous tissue, unspecified: Secondary | ICD-10-CM | POA: Insufficient documentation

## 2012-07-16 DIAGNOSIS — L819 Disorder of pigmentation, unspecified: Secondary | ICD-10-CM

## 2012-07-16 MED ORDER — OXYCODONE-ACETAMINOPHEN 5-500 MG PO CAPS: 1.0000 | ORAL_CAPSULE | ORAL | Status: AC | PRN

## 2012-07-16 NOTE — Patient Instructions (Signed)
Remove dressing tomorrow afternoon. Shower baths ok. Will see you end of next week to remove sutures.

## 2012-07-16 NOTE — Progress Notes (Signed)
Patient is a 51 year old who was seen in the office a few days ago for her annual gynecological examination. She brought to my attention several lesions that she had noted. One was in her vulvar region and the other one was in her medial right thigh and the other was in the right inguinal crease area. Examination and findings as follows:  Physical Exam  Genitourinary:      Patient was counseled for biopsies and excision of these lesions. The clitoral hood area was infiltrated with 1% lidocaine and with the scalpel the verrucus lesion was excised. Silver nitrate was used for hemostasis. The lesion on the right medial thigh  was also cleansed with Betadine solution and 1% lidocaine was infiltrated subdermally and a keypunch biopsy was obtained from the center of the 2 cm lesion. The right inguinal crease area that had the 3 hyperpigmented raised lesion the base was also  infiltrated  with 1% lidocaine and a wide local excision superficially was undertaken to remove all 3 lesions. The the edges were reapproximated with interrupted sutures of 3-0 Vicryl suture. Neosporin was placed in the area along with a pressure dressing. Patient will return back in one week to remove the suture. This is the pathology report is available will contact the patient inform her of the findings. She was given a prescription for Tylox to take one by mouth every 4-6 hours when necessary.

## 2012-07-23 ENCOUNTER — Other Ambulatory Visit: Payer: Self-pay | Admitting: Anesthesiology

## 2012-07-23 DIAGNOSIS — Z1211 Encounter for screening for malignant neoplasm of colon: Secondary | ICD-10-CM

## 2012-07-24 ENCOUNTER — Encounter: Payer: Self-pay | Admitting: Gynecology

## 2012-07-24 ENCOUNTER — Ambulatory Visit (INDEPENDENT_AMBULATORY_CARE_PROVIDER_SITE_OTHER): Payer: BC Managed Care – PPO | Admitting: Gynecology

## 2012-07-24 VITALS — BP 128/82

## 2012-07-24 DIAGNOSIS — Z9889 Other specified postprocedural states: Secondary | ICD-10-CM

## 2012-07-24 DIAGNOSIS — L439 Lichen planus, unspecified: Secondary | ICD-10-CM

## 2012-07-24 DIAGNOSIS — A630 Anogenital (venereal) warts: Secondary | ICD-10-CM

## 2012-07-24 MED ORDER — CLOBETASOL PROPIONATE 0.05 % EX OINT: TOPICAL_OINTMENT | CUTANEOUS | Status: DC

## 2012-07-24 NOTE — Patient Instructions (Signed)
Lichen Planus Lichen planus is a skin problem that causes redness, itching, swelling, and sores. Some common areas affected are the:  Vulva and vagina.   Gums and inside of the mouth.   Esophagus.   Scalp.   Skin of the arms (especially wrists), legs, chest, back, and abdomen.   Fingernails or toenails.  CAUSES The cause is not known. It could be an autoimmune illness or an allergy. An autoimmune illness is one where your body attacks itself. Lichen planus is not passed from one person to another (not contagious). It can last for a long time. Affected children usually have a history of other family members with lichen planus. SYMPTOMS  Itching, which can be severe.   The vaginal area may be red, sore, raw, or have a burning feeling.   There may be pain or bleeding during sex.   Scarring may cause the vagina to become short, narrow, or even closed up.   The skin may have small reddish or purplish, flat-topped, round or irregularly shaped bumps.   There may be redness or white patches on the gums or tongue.   The nails may become thin, rough, or have ridges in them.   The eyes can rarely also be involved with pain, redness, or scarring.  DIAGNOSIS Your caregiver will look for skin changes, changes inside your mouth, or vaginal discharge. Sometimes, a tissue sample (biopsy) may be sent for testing. TREATMENT  Your caregiver may order a cream (topical steroid) to be put on the sores.   You may be given medicine to take by mouth.   You may be treated by exposure to ultraviolet light.   Sores in the mouth may be treated with lozenges that you suck on like a cough drop.   If the vagina becomes too tight, you may be taught how to use a dilator to keep it open.  HOME CARE INSTRUCTIONS  Only take over-the-counter or prescription medicines as directed by your caregiver.   Keep the vaginal area as clean and dry as possible.  SEEK MEDICAL CARE IF: You develop increasing pain,  swelling, or redness. Document Released: 03/28/2011 Document Revised: 10/24/2011 Document Reviewed: 03/28/2011 [facility] Patient Information 2012 [place], [place].  Genital Warts Genital warts are a sexually transmitted infection. They may appear as small bumps on the tissues of the genital area. CAUSES  Genital warts are caused by a virus called human papillomavirus (HPV). HPV is the most common sexually transmitted disease (STD) and infection of the sex organs. This infection is spread by having unprotected sex with an infected person. It can be spread by vaginal, anal, and oral sex. Many people do not know they are infected. They may be infected for years without problems. However, even if they do not have problems, they can unknowingly pass the infection to their sexual partners. SYMPTOMS   Itching and irritation in the genital area.   Warts that bleed.   Painful sexual intercourse.  DIAGNOSIS  Warts are usually recognized with the naked eye on the vagina, vulva, perineum, anus, and rectum. Certain tests can also diagnose genital warts, such as:  A Pap test.   A tissue sample (biopsy) exam.   Colposcopy. A magnifying tool is used to examine the vagina and cervix. The HPV cells will change color when certain solutions are used.  TREATMENT  Warts can be removed by:  Applying certain chemicals, such as cantharidin or podophyllin.   Liquid nitrogen freezing (cryotherapy).   Immunotherapy with candida or trichophyton  injections.   Laser treatment.   Burning with an electrified probe (electrocautery).   Interferon injections.   Surgery.  PREVENTION  HPV vaccination can help prevent HPV infections that cause genital warts and that cause cancer of the cervix. It is recommended that the vaccination be given to people between the ages 46 to 52 years old. The vaccine might not work as well or might not work at all if you already have HPV. It should not be given to pregnant women. HOME  CARE INSTRUCTIONS   It is important to follow your caregiver's instructions. The warts will not go away without treatment. Repeat treatments are often needed to get rid of warts. Even after it appears that the warts are gone, the normal tissue underneath often remains infected.   Do not try to treat genital warts with medicine used to treat hand warts. This type of medicine is strong and can burn the skin in the genital area, causing more damage.   Tell your past and current sexual partner(s) that you have genital warts. They may be infected also and need treatment.   Avoid sexual contact while being treated.   Do not touch or scratch the warts. The infection may spread to other parts of your body.   Women with genital warts should have a cervical cancer check (Pap test) at least once a year. This type of cancer is slow-growing and can be cured if found early. Chances of developing cervical cancer are increased with HPV.   Inform your obstetrician about your warts in the event of pregnancy. This virus can be passed to the baby's respiratory tract. Discuss this with your caregiver.   Use a condom during sexual intercourse. Following treatment, the use of condoms will help prevent reinfection.   Ask your caregiver about using over-the-counter anti-itch creams.  SEEK MEDICAL CARE IF:   Your treated skin becomes red, swollen, or painful.   You have a fever.   You feel generally ill.   You feel little lumps in and around your genital area.   You are bleeding or have painful sexual intercourse.  MAKE SURE YOU:   Understand these instructions.   Will watch your condition.   Will get help right away if you are not doing well or get worse.  Document Released: 11/01/2000 Document Revised: 10/24/2011 Document Reviewed: 05/13/2011 [facility] Patient Information 2012 [place], [place].

## 2012-07-24 NOTE — Progress Notes (Signed)
Patient is a 51 year old who was seen the office on August 29 as a result of several lesions that patient had noted on her periclitoral region and her medial thigh and her right groin crease. See previous note dated August 29 with picture description of findings. Pathology report as follows:   Diagnosis 1. 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 Skin , medial right thigh, bx - BENIGN EPIDERMAL HYPERPLASIA, SEE DESCRIPTION Microscopic Comment 1. ,2. There is an exophytic proliferation of slightly basaloid keratinocytes. They form anastomosing rete and focally show hints of horn pseudocysts. The surface contains hypergranulosis and compact orthokeratosis, with a suggestion of koilocytic nuclear changes. Although the overall architecture is that of a seborrheic keratosis, the secondary features suggest a condyloma acuminatum. Seborrheic keratoses on or near genitalia are frequently associated with human papillomavirus (HPV) and, in that instance, are essentially condylomata acuminata. There is no evidence of VIN/high grade dysplasia or malignancy. 3. There is benign papillary epidermal hyperplasia with hyperkeratosis. There is a lymphocytic infiltrate. No diagnostic viral changes are observed. Considerations in this setting are a verruca, seborrheic keratosis, or reactive epidermal hyperplasia such as lichen simplex chronicus or evolving prurigo nodule.  The sutures were removed from the right groin region. The periclitoral and right medial thigh keypunch biopsy area has still not completely healed. I contacted Dr. Danella Deis dermatologist and she recommended a high potency corticosteroid cream to be applied to the medial thigh for the lichen simplex chronicus I explained to the patient that this lesion was benign.. She will be placed on clobetasol  0.05% to apply twice a day for the first week then daily for the next 4-6 weeks. 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 She will return back in 4 weeks for followup and once the keypunch biopsy sites have healed we may want to give her a one month treatment with Aldara cream. Literature information was provided on the subject outlined above all questions rancher will follow accordingly.

## 2012-07-29 ENCOUNTER — Other Ambulatory Visit: Payer: Self-pay | Admitting: Gynecology

## 2012-07-29 DIAGNOSIS — Z1211 Encounter for screening for malignant neoplasm of colon: Secondary | ICD-10-CM

## 2012-08-21 ENCOUNTER — Ambulatory Visit (INDEPENDENT_AMBULATORY_CARE_PROVIDER_SITE_OTHER): Payer: BC Managed Care – PPO | Admitting: Gynecology

## 2012-08-21 ENCOUNTER — Encounter: Payer: Self-pay | Admitting: Gynecology

## 2012-08-21 VITALS — BP 132/86

## 2012-08-21 DIAGNOSIS — Z23 Encounter for immunization: Secondary | ICD-10-CM

## 2012-08-21 DIAGNOSIS — N9089 Other specified noninflammatory disorders of vulva and perineum: Secondary | ICD-10-CM

## 2012-08-21 NOTE — Progress Notes (Addendum)
Patient is a 51 year old who was seen the office on August 29 as a result of several lesions that patient had noted on her periclitoral region and her medial thigh and her right groin crease. The areas were biopsied with the final pathology report as follows:  Diagnosis  1. 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 Skin , medial right thigh, bx  - BENIGN EPIDERMAL HYPERPLASIA, SEE DESCRIPTION  Microscopic Comment  1. ,2. There is an exophytic proliferation of slightly basaloid keratinocytes. They form anastomosing rete and  focally show hints of horn pseudocysts. The surface contains hypergranulosis and compact orthokeratosis,  with a suggestion of koilocytic nuclear changes. Although the overall architecture is that of a seborrheic  keratosis, the secondary features suggest a condyloma acuminatum. Seborrheic keratoses on or near  genitalia are frequently associated with human papillomavirus (HPV) and, in that instance, are essentially  condylomata acuminata. There is no evidence of VIN/high grade dysplasia or malignancy.  3. There is benign papillary epidermal hyperplasia with hyperkeratosis. There is a lymphocytic infiltrate. No  diagnostic viral changes are observed. Considerations in this setting are a verruca, seborrheic keratosis, or  reactive epidermal hyperplasia such as lichen simplex chronicus or evolving prurigo nodule.    I contacted Dr. Danella Deis dermatologist and she recommended a high potency corticosteroid cream to be applied to the medial thigh for the lichen simplex chronicus I explained to the patient that this lesion was benign.. She was started on  clobetasol 0.05% to apply twice a day for the first week then daily for the next 4-6 weeks. She was instructed to do sitz baths and for the periclitoral and groin condyloma  acuminatum excision sites she can apply Neosporin for now since it appears that it was completely excised. She returned to the office today for followup. In the areas of completely healed some slight scarring from the keypunch biopsy site on her right medial thigh and right inguinal crease. She will continue to apply the clobetasol daily for 2 more weeks. I do not see any evidence of the condyloma now that would require for her to apply Aldara cream so we'll watch over the next few months. She did receive the dTap Vaccine since his been over 10 years and she ever had it.

## 2012-08-21 NOTE — Patient Instructions (Addendum)
Diphtheria, Tetanus, and Pertussis (DTaP) Vaccine What You Need to Know WHY GET VACCINATED? Diphtheria, tetanus, and pertussis are serious diseases caused by bacteria. Diphtheria and pertussis are spread from person to person. Tetanus enters the body through cuts or wounds. Diphtheria causes a thick covering in the back of the throat.  It can lead to breathing problems, paralysis, heart failure, and even death. Tetanus (Lockjaw) causes painful tightening of the muscles, usually all over the body.  It can lead to "locking" of the jaw so the victim cannot open his or her mouth or swallow. Tetanus leads to death in about 2 out of 10 cases. Pertussis (Whooping Cough) causes coughing spells so bad that it is hard for infants to eat, drink, or breathe. These spells can last for weeks.  It can lead to pneumonia, seizures (jerking and staring spells), brain damage, and death. Diphtheria, tetanus, and pertussis vaccine (DTaP) can help prevent these diseases. Most children who are vaccinated with DTaP will be protected throughout childhood. Many more children would get these diseases if we stopped vaccinating. DTaP is a safer version of an older vaccine called DTP. DTP is no longer used in the [place]. WHO SHOULD GET DTAP VACCINE AND WHEN? Children should get 5 doses of DTaP vaccine, 1 dose at each of the following ages:  2 months.  4 months.  6 months.  15 to 18 months.  4 to 6 years. DTaP may be given at the same time as other vaccines. SOME CHILDREN SHOULD NOT GET DTAP VACCINE OR SHOULD WAIT  Children with minor illnesses, such as a cold, may be vaccinated. But children who are moderately or severely ill should usually wait until they recover before getting DTaP vaccine.  Any child who had a life-threatening allergic reaction after a dose of DTaP should not get another dose.  Any child who suffered a brain or nervous system disease within 7 days after a dose of DTaP should not get  another dose.  Talk with your caregiver if your child:  Had a seizure or collapsed after a dose of DTaP.  Cried non-stop for 3 hours or more after a dose of DTaP.  Had a fever over 105 F (40.6 C) after a dose of DTaP.  Ask your caregiver for more information. Some of these children should not get another dose of pertussis vaccine, but may get a vaccine without pertussis, called DT. OLDER CHILDREN AND ADULTS  DTaP is not licensed for adolescents, adults, or children 7 years of age and older.  But older people still need protection. A vaccine called Tdap is similar to DTaP. A single dose of Tdap is recommended for people 11 through 53 years of age. Another vaccine, called Td, protects against tetanus and diphtheria, but not pertussis. It is recommended every 10 years. WHAT ARE THE RISKS FROM DTAP VACCINE?  Getting diphtheria, tetanus, or pertussis disease is much riskier than getting DTaP vaccine.  However, a vaccine, like any medicine, is capable of causing serious problems, such as severe allergic reactions. The risk of DTaP vaccine causing serious harm, or death, is extremely small. Mild Problems (Common)  Fever (up to about 1 child in 4).  Redness or swelling where the shot was given (up to about 1 child in 4).  Soreness or tenderness where the shot was given (up to about 1 child in 4). These problems occur more often after the 4th and 5th doses of the DTaP series than after earlier doses. Sometimes the 4th   or 5th dose of DTaP vaccine is followed by swelling of the entire arm or leg in which the shot was given, lasting 1 to 7 days (up to about 1 child in 30). Other mild problems include:  Fussiness (up to about 1 child in 3).  Tiredness or poor appetite (up to about 1 child in 10).  Vomiting (up to about 1 child in 50). These problems generally occur 1 to 3 days after the shot. Moderate Problems (Uncommon)  Seizure (jerking or staring) (about 1 child out of  14,000).  Non-stop crying, for 3 hours or more (up to about 1 child out of 1,000).  High fever, over 105 F (40.6 C) (about 1 child out of 16,000). Severe Problems (Very Rare)  Serious allergic reaction (less than 1 out of a million doses).  Several other severe problems have been reported after DTaP vaccine. These include:  Long-term seizures, coma, or lowered consciousness.  Permanent brain damage. These are so rare it is hard to tell if they are caused by the vaccine. Controlling fever is especially important for children who have had seizures, for any reason. It is also important if another family member has had seizures. You can reduce fever and pain by giving your child an aspirin-free pain reliever when the shot is given, and for the next 24 hours, following the package instructions. WHAT IF THERE IS A MODERATE OR SEVERE REACTION? What should I look for? Any unusual conditions, such as a serious allergic reaction, high fever, or unusual behavior. Serious allergic reactions are extremely rare with any vaccine. If one were to occur, it would most likely be within a few minutes to a few hours after the shot. Signs can include difficulty breathing, hoarseness or wheezing, hives, paleness, weakness, a fast heartbeat, or dizziness. If a high fever or seizure were to occur, it would usually be within a week after the shot. What should I do?  Call your caregiver or get the person to a caregiver right away.  Tell the caregiver what happened, the date and time it happened, and when the vaccination was given.  Ask the caregiver, nurse, or health department to file a Vaccine Adverse Event Reporting System (VAERS) form. Or, you can file this report through the VAERS website at www.vaers.hhs.gov or by calling 1-800-822-7967. VAERS does not provide medical advice. THE NATIONAL VACCINE INJURY COMPENSATION PROGRAM  In the rare event that you or your child has a serious reaction to a vaccine, a  federal program has been created to help you pay for the care of those who have been harmed.  For details about the National Vaccine Injury Compensation Program, call 1-800-338-2382 or visit the program's website at www.hrsa.gov/vaccinecompensation HOW CAN I LEARN MORE?  Ask your caregiver. They can give you the vaccine package insert or suggest other sources of information.  Call your local or state health department's immunization program.  Contact the Centers for Disease Control and Prevention (CDC):  Call 1-800-232-4636 (1-800-CDC-INFO).  Visit the National Immunization Program's website at www.cdc.gov/vaccines CDC Diphtheria, Tetanus, and Pertussis (DTaP) Vaccine VIS (04/03/06) Document Released: 09/01/2006 Document Revised: 01/27/2012 Document Reviewed: 09/01/2006 [employer] Patient Information 2013 [employer].  

## 2012-12-23 ENCOUNTER — Telehealth: Payer: Self-pay | Admitting: *Deleted

## 2012-12-23 NOTE — Telephone Encounter (Signed)
Please call the pharmacy and ask them   If  the Estratest is a scored tablet. If it is a scored tablet I would like her to begin taking half a tablet daily for the next year. If not we would need to call in half a dose such as 0.625 for her to begin taking this entire year and next year we will wean her off completely.

## 2012-12-23 NOTE — Telephone Encounter (Signed)
Called pt job and unable to get pt to phone.

## 2012-12-23 NOTE — Telephone Encounter (Signed)
Pt is taking Estratest 1.25 mg daily prescribed in August 2013, pt said that you told her to wean off Rx. Pt asked how would you like her to do this? Please advise

## 2012-12-24 MED ORDER — EST ESTROGENS-METHYLTEST 0.625-1.25 MG PO TABS: 1.0000 | ORAL_TABLET | Freq: Every day | ORAL | Status: DC

## 2012-12-24 NOTE — Telephone Encounter (Signed)
Pt informed with the below note, rx called in 

## 2012-12-24 NOTE — Telephone Encounter (Signed)
Left message for pt to call.

## 2013-03-04 ENCOUNTER — Other Ambulatory Visit: Payer: Self-pay

## 2013-03-04 MED ORDER — EST ESTROGENS-METHYLTEST 0.625-1.25 MG PO TABS: 1.0000 | ORAL_TABLET | Freq: Every day | ORAL | Status: DC

## 2013-05-31 ENCOUNTER — Other Ambulatory Visit: Payer: Self-pay | Admitting: Gynecology

## 2013-05-31 DIAGNOSIS — Z1231 Encounter for screening mammogram for malignant neoplasm of breast: Secondary | ICD-10-CM

## 2013-07-15 ENCOUNTER — Encounter: Payer: BC Managed Care – PPO | Admitting: Gynecology

## 2013-07-16 ENCOUNTER — Ambulatory Visit (HOSPITAL_COMMUNITY)
Admission: RE | Admit: 2013-07-16 | Discharge: 2013-07-16 | Disposition: A | Discharge status: Home / Self Care | Payer: BC Managed Care – PPO | Source: Ambulatory Visit | Attending: Gynecology | Admitting: Gynecology

## 2013-07-16 ENCOUNTER — Encounter: Payer: Self-pay | Admitting: Gynecology

## 2013-07-16 ENCOUNTER — Ambulatory Visit (INDEPENDENT_AMBULATORY_CARE_PROVIDER_SITE_OTHER): Payer: BC Managed Care – PPO | Admitting: Gynecology

## 2013-07-16 VITALS — BP 130/88 | Ht 61.5 in | Wt 200.0 lb

## 2013-07-16 DIAGNOSIS — N951 Menopausal and female climacteric states: Secondary | ICD-10-CM

## 2013-07-16 DIAGNOSIS — L304 Erythema intertrigo: Secondary | ICD-10-CM

## 2013-07-16 DIAGNOSIS — R229 Localized swelling, mass and lump, unspecified: Secondary | ICD-10-CM

## 2013-07-16 DIAGNOSIS — Z79890 Hormone replacement therapy (postmenopausal): Secondary | ICD-10-CM

## 2013-07-16 DIAGNOSIS — Z780 Asymptomatic menopausal state: Secondary | ICD-10-CM

## 2013-07-16 DIAGNOSIS — Z1231 Encounter for screening mammogram for malignant neoplasm of breast: Secondary | ICD-10-CM | POA: Insufficient documentation

## 2013-07-16 DIAGNOSIS — Z8639 Personal history of other endocrine, nutritional and metabolic disease: Secondary | ICD-10-CM

## 2013-07-16 DIAGNOSIS — R1909 Other intra-abdominal and pelvic swelling, mass and lump: Secondary | ICD-10-CM

## 2013-07-16 DIAGNOSIS — Z01419 Encounter for gynecological examination (general) (routine) without abnormal findings: Secondary | ICD-10-CM

## 2013-07-16 DIAGNOSIS — L538 Other specified erythematous conditions: Secondary | ICD-10-CM

## 2013-07-16 DIAGNOSIS — Z1159 Encounter for screening for other viral diseases: Secondary | ICD-10-CM

## 2013-07-16 LAB — HEPATITIS C ANTIBODY: HCV Ab: NEGATIVE

## 2013-07-16 MED ORDER — EST ESTROGENS-METHYLTEST 0.625-1.25 MG PO TABS: 1.0000 | ORAL_TABLET | Freq: Every day | ORAL | Status: DC

## 2013-07-16 MED ORDER — NYSTATIN-TRIAMCINOLONE 100000-0.1 UNIT/GM-% EX CREA: TOPICAL_CREAM | CUTANEOUS | Status: DC

## 2013-07-16 NOTE — Progress Notes (Signed)
KHAMORA KARAN 08-08-1961 [id number]   History:    52 y.o.  for annual gyn exam with concerns of irritation and raised area on her right groin. Review of her record indicated the following:  August 29 as a result of several lesions that patient had noted on her periclitoral region and her medial thigh and her right groin crease. The areas were biopsied with the final pathology report as follows:  Diagnosis  1. 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 Skin , medial right thigh, bx  - BENIGN EPIDERMAL HYPERPLASIA, SEE DESCRIPTION  Microscopic Comment  1. ,2. There is an exophytic proliferation of slightly basaloid keratinocytes. They form anastomosing rete and  focally show hints of horn pseudocysts. The surface contains hypergranulosis and compact orthokeratosis,  with a suggestion of koilocytic nuclear changes. Although the overall architecture is that of a seborrheic  keratosis, the secondary features suggest a condyloma acuminatum. Seborrheic keratoses on or near  genitalia are frequently associated with human papillomavirus (HPV) and, in that instance, are essentially  condylomata acuminata. There is no evidence of VIN/high grade dysplasia or malignancy.  3. There is benign papillary epidermal hyperplasia with hyperkeratosis. There is a lymphocytic infiltrate. No  diagnostic viral changes are observed. Considerations in this setting are a verruca, seborrheic keratosis, or  reactive epidermal hyperplasia such as lichen simplex chronicus or evolving prurigo nodule.   I contacted Dr. Danella Deis dermatologist and she recommended a high potency corticosteroid cream to be applied to the medial thigh for the lichen simplex chronicus I explained to the patient that this lesion was benign.. She was started on clobetasol 0.05% to apply twice a  day for the first week then daily for the next 4-6 weeks.  Review of her record indicated that she had a total abdominal hysterectomy bilateral salpingo-oophorectomy back in 2005  And we have been tapering her estrogen from 1.25 mg of Estratest to 0.625 which she is currently on doing well. She is exercising and eating well she was weighing 230 pounds down to 200 pounds. She had a normal colonoscopy last year. She's had several internal hemorrhoids remove this year. Her mammogram was normal in 2013 and she had a 3 day period patient with no prior history of abnormal Pap smear. Last bone density study was normal in 2012. Her Tdap is up-to-date.   Past medical history,surgical history, family history and social history were all reviewed and documented in the EPIC chart.  Gynecologic History No LMP recorded. Patient has had a hysterectomy. Contraception: post menopausal status Last Pap: 2012. Results were: normal Last mammogram: 2013. Results were: normal but had mammogram this morning results pending  Obstetric History OB History  Gravida Para Term Preterm AB SAB TAB Ectopic Multiple Living  3 1  1 2 2    1     # Outcome Date GA Lbr Len/2nd Weight Sex Delivery Anes PTL Lv  3 SAB           2 SAB           1 PRE     F SVD  Y Y       ROS: A ROS was performed and pertinent positives and negatives are included in the history.  GENERAL: No fevers or chills. HEENT: No change in vision, no earache, sore throat or sinus congestion. NECK: No pain or stiffness.  CARDIOVASCULAR: No chest pain or pressure. No palpitations. PULMONARY: No shortness of breath, cough or wheeze. GASTROINTESTINAL: No abdominal pain, nausea, vomiting or diarrhea, melena or bright red blood per rectum. GENITOURINARY: No urinary frequency, urgency, hesitancy or dysuria. MUSCULOSKELETAL: No joint or muscle pain, no back pain, no recent trauma. DERMATOLOGIC:right groin rash and isolated pigmented raised area ENDOCRINE: No polyuria,  polydipsia, no heat or cold intolerance. No recent change in weight. HEMATOLOGICAL: No anemia or easy bruising or bleeding. NEUROLOGIC: No headache, seizures, numbness, tingling or weakness. PSYCHIATRIC: No depression, no loss of interest in normal activity or change in sleep pattern.     Exam: chaperone present  BP 130/88  Ht 5' 1.5" (1.562 m)  Wt 200 lb (90.719 kg)  BMI 37.18 kg/m2  Body mass index is 37.18 kg/(m^2).  General appearance : Well developed well nourished female. No acute distress HEENT: Neck supple, trachea midline, no carotid bruits, no thyroidmegaly Lungs: Clear to auscultation, no rhonchi or wheezes, or rib retractions  Heart: Regular rate and rhythm, no murmurs or gallops Breast:Examined in sitting and supine position were symmetrical in appearance, no palpable masses or tenderness,  no skin retraction, no nipple inversion, no nipple discharge, no skin discoloration, no axillary or supraclavicular lymphadenopathy Abdomen: no palpable masses or tenderness, no rebound or guarding Extremities: no edema or skin discoloration or tenderness  Pelvic:  Bartholin, Urethra, Skene Glands: Within normal limits             Vagina: No gross lesions or discharge  Cervix:absence              Uterus: Absent   Adnexa  Without masses or tenderness  Anus and perineum  normal   Rectovaginal  normal sphincter tone without palpated masses or tenderness             Hemoccult recent colonoscopy recent internal hemorrhoidectomy   Physical Exam  Genitourinary:          Assessment/Plan:  52 y.o. female for annual exam patient will be treated for intertrigo with Mytrex cream to apply twice a day for 7-10 days. She will return back to the office for a biopsy of this dark pigmented area. Patient's primary physician is doing her lab work. Pap smear not done today screening guidelines discussed. She will schedule her bone density study here in our office.  New CDC guidelines is  recommending patients be tested once in her lifetime for hepatitis C antibody who were born between [birth year] through 1967. This was discussed with the patient today and has agreed to be tested today.    Ok Edwards MD, 11:20 AM 07/16/2013

## 2013-08-03 ENCOUNTER — Ambulatory Visit (INDEPENDENT_AMBULATORY_CARE_PROVIDER_SITE_OTHER): Payer: BC Managed Care – PPO | Admitting: Gynecology

## 2013-08-03 ENCOUNTER — Encounter: Payer: Self-pay | Admitting: Gynecology

## 2013-08-03 VITALS — BP 124/80

## 2013-08-03 DIAGNOSIS — A630 Anogenital (venereal) warts: Secondary | ICD-10-CM

## 2013-08-03 MED ORDER — IMIQUIMOD 5 % EX CREA: TOPICAL_CREAM | CUTANEOUS | Status: DC

## 2013-08-03 NOTE — Progress Notes (Signed)
Desiree Holmes presented to the office today for followup due to the last office visit August 29 she is complaining of irritated and raised areas on her right groin.  July 16, 2012 as a result of several lesions that patient had noted on her periclitoral region and her medial thigh and her right groin crease. The areas were biopsied with the final pathology report as follows:   Diagnosis  1. 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 Skin , medial right thigh, bx  - BENIGN EPIDERMAL HYPERPLASIA, SEE DESCRIPTION  Microscopic Comment  1. ,2. There is an exophytic proliferation of slightly basaloid keratinocytes. They form anastomosing rete and  focally show hints of horn pseudocysts. The surface contains hypergranulosis and compact orthokeratosis,  with a suggestion of koilocytic nuclear changes. Although the overall architecture is that of a seborrheic  keratosis, the secondary features suggest a condyloma acuminatum. Seborrheic keratoses on or near  genitalia are frequently associated with human papillomavirus (HPV) and, in that instance, are essentially  condylomata acuminata. There is no evidence of VIN/high grade dysplasia or malignancy.  3. There is benign papillary epidermal hyperplasia with hyperkeratosis. There is a lymphocytic infiltrate. No  diagnostic viral changes are observed. Considerations in this setting are a verruca, seborrheic keratosis, or  reactive epidermal hyperplasia such as lichen simplex chronicus or evolving prurigo nodule.   I contacted Dr. Danella Deis dermatologist and she had recommended a high potency corticosteroid cream to be applied to the medial thigh for the lichen simplex chronicus. The patient was started on clobetasol 0.05% to apply twice a day for the first week then daily for the next 4-6 weeks.  After that  for groin intertrigo she had been placed on Mytrex cream which she finished a few days ago and intertrigo has cleared up but the raised areas are more concerning to her. She is here for followup biopsy. Physical Exam  Genitourinary:     biopsy was obtained from the raised right groin and hyperpigmented lesion after the area was cleansed with Betadine solution and 1% lidocaine was infiltrated subdermally. A small elliptical excision was done to remove the lesion. Silver nitrate was used for hemostasis.  Assessment/plan: With current findings and previous biopsies consistent with changes of condyloma we'll begin treating her with Aldara cream to apply 3 times a week before bedtime and to return to the office in one month. We discussed the possibility of needing to extend the treatment for up to 2 months. She will need to wash off after 8 or 10 hours after applying the Aldara cream. Will wait the results of the biopsy which should be available by then a week.

## 2013-08-03 NOTE — Patient Instructions (Signed)
Imiquimod skin cream  What is this medicine?  IMIQUIMOD (i mi KWI mod) cream is used to treat external genital or anal warts. It is also used to treat other skin conditions such as actinic keratosis and certain types of skin cancer.  This medicine may be used for other purposes; ask your health care provider or pharmacist if you have questions.  What should I tell my health care provider before I take this medicine?  They need to know if you have any of these conditions:  -decreased immune function  -an unusual or allergic reaction to imiquimod, other medicines, foods, dyes, or preservatives  -pregnant or trying to get pregnant  -breast-feeding  How should I use this medicine?  This medicine is for external use only. Do not take by mouth. Follow the directions on the prescription label. Apply just before bedtime. Wash your hands before and after use. Apply a thin layer of cream and massage gently into the affected areas until no longer visible. Do not use in the mouth, eyes or the vagina. Use this medicine only on the affected area as directed by your health care provider. Do not use for longer than prescribed. It is important not to use more medicine than prescribed. To do so may increase the chance of side effects.  Talk to your pediatrician regarding the use of this medicine in children. While this drug may be prescribed for children as young as 12 years of age for selected conditions, precautions do apply.  Overdosage: If you think you have taken too much of this medicine contact a poison control center or emergency room at once.  NOTE: This medicine is only for you. Do not share this medicine with others.  What if I miss a dose?  If you miss a dose, use it as soon as you can. If it is almost time for your next dose, use only that dose. Do not use double or extra doses.  What may interact with this medicine?  Interactions are not expected. Do not use any other medicines on the treated area without asking your  doctor or health care professional.  This list may not describe all possible interactions. Give your health care provider a list of all the medicines, herbs, non-prescription drugs, or dietary supplements you use. Also tell them if you smoke, drink alcohol, or use illegal drugs. Some items may interact with your medicine.  What should I watch for while using this medicine?  Visit your health care professional for regular checks on your progress.  Do not use this medicine until the skin has healed from any other drug (example: podofilox or podophyllin resin) or surgical skin treatment.  Females should receive regular pelvic exams while being treated for genital warts. Most patients see improvement within 4 weeks. It may take up to 16 weeks to see a full clearing of the warts. This medicine is not a cure. New warts may develop during or after treatment. Avoid sexual (genital, anal, oral) contact while the cream is on the skin. If warts are visible in the genital area, sexual contact should be avoided until the warts are treated. The use of latex condoms during sexual contact may reduce, but not entirely prevent, infecting others. This medicine may weaken condoms, diaphragms, cervical caps or other barrier devices and make them less effective as birth control. Do not cover the treated area with an airtight bandage. Cotton gauze dressings can be used. Cotton underwear can be worn after using this medicine   treatment area and surrounding area may lighten or darken after treatment with this medicine. These skin color changes may be permanent in some patients. If you experience a skin reaction at the treatment site that interferes or prevents you from doing any daily activity, contact your health care provider. You may need a rest period from treatment. Treatment may be restarted once the  reaction has gotten better as recommended by your doctor or health care professional. This medicine can make you more sensitive to the sun. Keep out of the sun. If you cannot avoid being in the sun, wear protective clothing and use sunscreen. Do not use sun lamps or tanning beds/booths. What side effects may I notice from receiving this medicine? Side effects that you should report to your doctor or health care professional as soon as possible: -open sores with or without drainage -skin infection -skin rash -unusual or severe skin reaction Side effects that usually do not require medical attention (report to your doctor or health care professional if they continue or are bothersome): -burning or itching -redness of the skin (very common but is usually not painful or harmful) -scabbing, crusting, or peeling skin -skin that becomes hard or thickened -swelling of the skin This list may not describe all possible side effects. Call your doctor for medical advice about side effects. You may report side effects to FDA at 1-800-FDA-1088. Where should I keep my medicine? Keep out of the reach of children. Store between 4 and 25 degrees C (39 and 77 degrees F). Do not freeze. Throw away any unused medicine after the expiration date. Discard packet after applying to affected area. Partial packets should not be saved or reused. NOTE: This sheet is a summary. It may not cover all possible information. If you have questions about this medicine, talk to your doctor, pharmacist, or health care provider.  2013, [employer]. (10/18/2008 10:33:25 AM) Genital Warts Genital warts are a sexually transmitted infection. They may appear as small bumps on the tissues of the genital area. CAUSES  Genital warts are caused by a virus called human papillomavirus (HPV). HPV is the most common sexually transmitted disease (STD) and infection of the sex organs. This infection is spread by having unprotected sex  with an infected person. It can be spread by vaginal, anal, and oral sex. Many people do not know they are infected. They may be infected for years without problems. However, even if they do not have problems, they can unknowingly pass the infection to their sexual partners. SYMPTOMS   Itching and irritation in the genital area.  Warts that bleed.  Painful sexual intercourse. DIAGNOSIS  Warts are usually recognized with the naked eye on the vagina, vulva, perineum, anus, and rectum. Certain tests can also diagnose genital warts, such as:  A Pap test.  A tissue sample (biopsy) exam.  Colposcopy. A magnifying tool is used to examine the vagina and cervix. The HPV cells will change color when certain solutions are used. TREATMENT  Warts can be removed by:  Applying certain chemicals, such as cantharidin or podophyllin.  Liquid nitrogen freezing (cryotherapy).  Immunotherapy with candida or trichophyton injections.  Laser treatment.  Burning with an electrified probe (electrocautery).  Interferon injections.  Surgery. PREVENTION  HPV vaccination can help prevent HPV infections that cause genital warts and that cause cancer of the cervix. It is recommended that the vaccination be given to people between the ages 64 to 64 years old. The vaccine might not work as well or  might not work at all if you already have HPV. It should not be given to pregnant women. HOME CARE INSTRUCTIONS   It is important to follow your caregiver's instructions. The warts will not go away without treatment. Repeat treatments are often needed to get rid of warts. Even after it appears that the warts are gone, the normal tissue underneath often remains infected.  Do not try to treat genital warts with medicine used to treat hand warts. This type of medicine is strong and can burn the skin in the genital area, causing more damage.  Tell your past and current sexual partner(s) that you have genital warts. They  may be infected also and need treatment.  Avoid sexual contact while being treated.  Do not touch or scratch the warts. The infection may spread to other parts of your body.  Women with genital warts should have a cervical cancer check (Pap test) at least once a year. This type of cancer is slow-growing and can be cured if found early. Chances of developing cervical cancer are increased with HPV.  Inform your obstetrician about your warts in the event of pregnancy. This virus can be passed to the baby's respiratory tract. Discuss this with your caregiver.  Use a condom during sexual intercourse. Following treatment, the use of condoms will help prevent reinfection.  Ask your caregiver about using over-the-counter anti-itch creams. SEEK MEDICAL CARE IF:   Your treated skin becomes red, swollen, or painful.  You have a fever.  You feel generally ill.  You feel little lumps in and around your genital area.  You are bleeding or have painful sexual intercourse. MAKE SURE YOU:   Understand these instructions.  Will watch your condition.  Will get help right away if you are not doing well or get worse. Document Released: 11/01/2000 Document Revised: 01/27/2012 Document Reviewed: 05/13/2011 [facility] Patient Information 2014 [place], [place].

## 2013-08-05 ENCOUNTER — Telehealth: Payer: Self-pay | Admitting: *Deleted

## 2013-08-05 NOTE — Telephone Encounter (Signed)
Pt was seen on 08/03/13 for Vulvar biopsy Rx given for Aldara cream to apply 3 times a week before bedtime and to return to the office in one month. Pt said that she has noticed more bumps in the area, she did read that while receiving treatment this could occur, but asked if this using 3 times a week is aggressive enough? She asked if a pap should be done since this has occurred also? Last pap in 2012. Please advise

## 2013-08-05 NOTE — Telephone Encounter (Signed)
Pt informed with the below note. 

## 2013-08-05 NOTE — Telephone Encounter (Signed)
Apply to area 3 times a week as instructed and follow up in 1 month. Will discuss the issue of PaP and the new guidelines then

## 2013-08-06 ENCOUNTER — Telehealth: Payer: Self-pay

## 2013-08-06 NOTE — Telephone Encounter (Signed)
Message copied by Keenan Bachelor on Fri Aug 06, 2013 12:08 PM ------      Message from: Ok Edwards      Created: Thu Aug 05, 2013  4:47 PM       Please inform patient that her biopsy was a benign polyp ------

## 2013-08-06 NOTE — Telephone Encounter (Signed)
Yes because of her past history and the other small lesions seen.

## 2013-08-06 NOTE — Telephone Encounter (Signed)
Patient asked in light of biopsy result does she need to continue using the Aldara cream?

## 2013-08-09 NOTE — Telephone Encounter (Signed)
Pt informed with the below note. 

## 2013-09-07 ENCOUNTER — Ambulatory Visit: Payer: BC Managed Care – PPO | Admitting: Gynecology

## 2013-09-07 ENCOUNTER — Ambulatory Visit (INDEPENDENT_AMBULATORY_CARE_PROVIDER_SITE_OTHER): Payer: BC Managed Care – PPO | Admitting: Gynecology

## 2013-09-07 ENCOUNTER — Encounter: Payer: Self-pay | Admitting: Gynecology

## 2013-09-07 ENCOUNTER — Ambulatory Visit (INDEPENDENT_AMBULATORY_CARE_PROVIDER_SITE_OTHER): Payer: BC Managed Care – PPO

## 2013-09-07 VITALS — BP 128/84

## 2013-09-07 DIAGNOSIS — A630 Anogenital (venereal) warts: Secondary | ICD-10-CM

## 2013-09-07 DIAGNOSIS — Z79890 Hormone replacement therapy (postmenopausal): Secondary | ICD-10-CM

## 2013-09-07 DIAGNOSIS — Z13820 Encounter for screening for osteoporosis: Secondary | ICD-10-CM

## 2013-09-07 DIAGNOSIS — Z780 Asymptomatic menopausal state: Secondary | ICD-10-CM

## 2013-09-07 NOTE — Progress Notes (Addendum)
Patient presented to the office today for one month followup after treatment with Aldara for external vulvar condyloma. Her history is as follows:  July 16, 2012 as a result of several lesions that patient had noted on her periclitoral region and her medial thigh and her right groin crease. The areas were biopsied with the final pathology report as follows:  Diagnosis  1. 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 Skin , medial right thigh, bx  - BENIGN EPIDERMAL HYPERPLASIA, SEE DESCRIPTION  Microscopic Comment  1. ,2. There is an exophytic proliferation of slightly basaloid keratinocytes. They form anastomosing rete and  focally show hints of horn pseudocysts. The surface contains hypergranulosis and compact orthokeratosis,  with a suggestion of koilocytic nuclear changes. Although the overall architecture is that of a seborrheic  keratosis, the secondary features suggest a condyloma acuminatum. Seborrheic keratoses on or near  genitalia are frequently associated with human papillomavirus (HPV) and, in that instance, are essentially  condylomata acuminata. There is no evidence of VIN/high grade dysplasia or malignancy.  3. There is benign papillary epidermal hyperplasia with hyperkeratosis. There is a lymphocytic infiltrate. No  diagnostic viral changes are observed. Considerations in this setting are a verruca, seborrheic keratosis, or  reactive epidermal hyperplasia such as lichen simplex chronicus or evolving prurigo nodule.   I contacted Dr. Danella Deis dermatologist and she had recommended a high potency corticosteroid cream to be applied to the medial thigh for the lichen simplex chronicus. The patient was started on clobetasol 0.05% to apply twice a day for the first week then daily for the next 4-6 weeks.  After that for groin  intertrigo she had been placed on Mytrex cream which she finished a few days ago and intertrigo has cleared up but the raised areas are more concerning to her.  She states that the area has completely healed so she presented for followup exam:  Pelvic exam: Bartholin urethra Skene was within normal limits Vagina and vulva perineum and perirectal region no lesions were noted. Picture was reviewed from prior to treatment to identify the areas and there are no longer evident. Her groin area still some lesion still present I asked the patient to apply the Aldara cream 3 times a week for one more month and up to 6 weeks.if it is not clear completely she will try the clobetasol steroid cream for 6-8 weeks if it does not resolve in the groin area she will return back to the office.  The patient did have her bone density study today as well which findings were discussed. She had a normal bone density study with statistical improvement on BMD of the AP spine and statistical decrease in BMD of the right total hip. She is currently taking calcium and vitamin D and exercise. We will repeat her bone density study in 2 years.

## 2013-10-06 ENCOUNTER — Other Ambulatory Visit: Payer: Self-pay | Admitting: Gynecology

## 2013-10-06 NOTE — Telephone Encounter (Signed)
Annual was on 07/16/13.

## 2013-10-06 NOTE — Telephone Encounter (Signed)
rx called

## 2014-01-18 ENCOUNTER — Other Ambulatory Visit: Payer: Self-pay | Admitting: Gynecology

## 2014-05-26 ENCOUNTER — Other Ambulatory Visit: Payer: Self-pay | Admitting: Gynecology

## 2014-05-26 DIAGNOSIS — Z1231 Encounter for screening mammogram for malignant neoplasm of breast: Secondary | ICD-10-CM

## 2014-07-19 ENCOUNTER — Ambulatory Visit (HOSPITAL_COMMUNITY): Payer: BC Managed Care – PPO

## 2014-07-20 ENCOUNTER — Ambulatory Visit (HOSPITAL_COMMUNITY)
Admission: RE | Admit: 2014-07-20 | Discharge: 2014-07-20 | Disposition: A | Discharge status: Home / Self Care | Payer: BC Managed Care – PPO | Source: Ambulatory Visit | Attending: Gynecology | Admitting: Gynecology

## 2014-07-20 ENCOUNTER — Other Ambulatory Visit (HOSPITAL_COMMUNITY)
Admission: RE | Admit: 2014-07-20 | Discharge: 2014-07-20 | Disposition: A | Discharge status: Home / Self Care | Payer: BC Managed Care – PPO | Source: Ambulatory Visit | Attending: Gynecology | Admitting: Gynecology

## 2014-07-20 ENCOUNTER — Ambulatory Visit (INDEPENDENT_AMBULATORY_CARE_PROVIDER_SITE_OTHER): Payer: BC Managed Care – PPO | Admitting: Gynecology

## 2014-07-20 ENCOUNTER — Encounter: Payer: Self-pay | Admitting: Gynecology

## 2014-07-20 ENCOUNTER — Other Ambulatory Visit: Payer: Self-pay | Admitting: Gynecology

## 2014-07-20 VITALS — BP 130/80 | Ht 63.0 in | Wt 217.0 lb

## 2014-07-20 DIAGNOSIS — Z1231 Encounter for screening mammogram for malignant neoplasm of breast: Secondary | ICD-10-CM

## 2014-07-20 DIAGNOSIS — N9089 Other specified noninflammatory disorders of vulva and perineum: Secondary | ICD-10-CM

## 2014-07-20 DIAGNOSIS — Z1151 Encounter for screening for human papillomavirus (HPV): Secondary | ICD-10-CM | POA: Diagnosis present

## 2014-07-20 DIAGNOSIS — A630 Anogenital (venereal) warts: Secondary | ICD-10-CM

## 2014-07-20 DIAGNOSIS — Z79890 Hormone replacement therapy (postmenopausal): Secondary | ICD-10-CM

## 2014-07-20 DIAGNOSIS — Z01419 Encounter for gynecological examination (general) (routine) without abnormal findings: Secondary | ICD-10-CM | POA: Diagnosis present

## 2014-07-20 MED ORDER — EST ESTROGENS-METHYLTEST 0.625-1.25 MG PO TABS: 1.0000 | ORAL_TABLET | Freq: Every day | ORAL | Status: DC

## 2014-07-20 NOTE — Addendum Note (Signed)
Addended by: Berna Spare A on: 07/20/2014 04:34 PM   Modules accepted: Orders

## 2014-07-20 NOTE — Progress Notes (Signed)
Desiree Holmes 02/17/1961 [id number]   History:    53 y.o.  for annual gyn exam who is concerned about a new condylomatous growth in her pole area. Patient's past history as follows:  July 16, 2012 as a result of several lesions that patient had noted on her periclitoral region and her medial thigh and her right groin crease. The areas were biopsied with the final pathology report as follows:  Diagnosis  1. 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 Skin , medial right thigh, bx  - BENIGN EPIDERMAL HYPERPLASIA, SEE DESCRIPTION   Dermatologist Dr. 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  Patient is a 53 year old gravida 2 para 1 AB 1 (TAH/BSO) who has been on Estratest 1.25 mg daily and last year was decreased to 0.625 as we began to taper her off.She is being followed by her health care provider in [place], [place] who did all her labs in June and who have been monitoring her for hypertension for which she is currently on hypertensive agent she does not recall the name.  Normal bone density 2014  Past medical history,surgical history, family history and social history were all reviewed and documented in the EPIC chart.  Gynecologic History No LMP recorded. Patient has had a hysterectomy. Contraception: status post hysterectomy Last Pap: 2000. Results were: normal Last mammogram: 2015. Results were: Results pending  Obstetric History OB History  Gravida Para Term Preterm AB SAB TAB Ectopic Multiple Living  # Outcome Date GA Lbr Len/2nd Weight Sex Delivery Anes PTL Lv  3 SAB           2 SAB           1 PRE     F SVD  Y Y       ROS: A ROS was performed and pertinent positives  and negatives are included in the history.  GENERAL: No fevers or chills. HEENT: No change in vision, no earache, sore throat or sinus congestion. NECK: No pain or stiffness. CARDIOVASCULAR: No chest pain or pressure. No palpitations. PULMONARY: No shortness of breath, cough or wheeze. GASTROINTESTINAL: No abdominal pain, nausea, vomiting or diarrhea, melena or bright red blood per rectum. GENITOURINARY: No urinary frequency, urgency, hesitancy or dysuria. MUSCULOSKELETAL: No joint or muscle pain, no back pain, no recent trauma. DERMATOLOGIC: No rash, no itching, no lesions. ENDOCRINE: No polyuria, polydipsia, no heat or cold intolerance. No recent change in weight. HEMATOLOGICAL: No anemia or easy bruising or bleeding. NEUROLOGIC: No headache, seizures, numbness, tingling or weakness. PSYCHIATRIC: No depression, no loss of interest in normal activity or change in sleep pattern.     Exam: chaperone present  BP 130/80  Ht  (1.6 m)  Wt 217 lb (98.431 kg)  BMI 38.45 kg/m2  Body mass index is 38.45 kg/(m^2).  General appearance : Well developed well nourished female. No acute distress HEENT: Neck supple, trachea midline, no carotid bruits, no thyroidmegaly Lungs: Clear to auscultation, no rhonchi or wheezes, or rib retractions  Heart: Regular rate and rhythm, no murmurs or gallops Breast:Examined in sitting and supine position were symmetrical in appearance, no palpable masses or tenderness,  no skin retraction,  no nipple inversion, no nipple discharge, no skin discoloration, no axillary or supraclavicular lymphadenopathy Abdomen: no palpable masses or tenderness, no rebound or guarding Extremities: no edema or skin discoloration or tenderness  Pelvic:  Bartholin, Urethra, Skene Glands: Within normal limits             Vagina: No gross lesions or discharge  Cervix: Absent  Uterus  Absent  Adnexa  Without masses or tenderness  Anus and perineum  normal   Rectovaginal  normal sphincter  tone without palpated masses or tenderness             Hemoccult PCP provides   Physical Exam  Genitourinary:        Assessment/Plan:  53 y.o. female with past history of condyloma acuminatum will be asked to use Aldara cream to apply it 3 times a week for 3 months and followup in the office. Pap smear was done today. PCP will be doing her blood work. She is going to "cut in half her Estratest of 0.625 mg daily as we taper her off her HRT. She was reminded of the importance of monthly self breast examination. We discussed importance of calcium vitamin D for osteoporosis prevention.  Note: This dictation was prepared with  Dragon/digital dictation along withSmart phrase technology. Any transcriptional errors that result from this process are unintentional.   Ok Edwards MD, 4:15 PM 07/20/2014

## 2014-07-22 ENCOUNTER — Telehealth: Payer: Self-pay

## 2014-07-22 LAB — CYTOLOGY - PAP

## 2014-07-22 MED ORDER — IMIQUIMOD 5 % EX CREA: TOPICAL_CREAM | CUTANEOUS | Status: DC

## 2014-07-22 NOTE — Telephone Encounter (Signed)
Patient said Dr. Glenetta Hew recommended she use Aldara cream and she had told Dr. Glenetta Hew she had some but once home she could not find it. Needs Rx. Rx sent.

## 2014-09-09 ENCOUNTER — Other Ambulatory Visit: Payer: Self-pay | Admitting: Gynecology

## 2014-09-12 ENCOUNTER — Other Ambulatory Visit: Payer: Self-pay | Admitting: Gynecology

## 2014-09-12 NOTE — Telephone Encounter (Signed)
rx called in

## 2014-09-19 ENCOUNTER — Encounter: Payer: Self-pay | Admitting: Gynecology

## 2014-10-19 ENCOUNTER — Ambulatory Visit: Payer: BC Managed Care – PPO | Admitting: Gynecology

## 2014-11-08 ENCOUNTER — Encounter: Payer: Self-pay | Admitting: Gynecology

## 2014-11-08 ENCOUNTER — Ambulatory Visit (INDEPENDENT_AMBULATORY_CARE_PROVIDER_SITE_OTHER): Payer: BC Managed Care – PPO | Admitting: Gynecology

## 2014-11-08 VITALS — BP 128/86

## 2014-11-08 DIAGNOSIS — A630 Anogenital (venereal) warts: Secondary | ICD-10-CM

## 2014-11-08 NOTE — Progress Notes (Deleted)
   Patient presented to the office for 3 month follow-up after having been prescribed Aldara topical cream for patient's condyloma acuminatum. Patient was applied 3 times a week for these past 3 months and is here for follow-up. Please see previous note outlining picturing location. Review of her record indicated in August 2013 she had several lesions that patient had noted on her periclitoral region and her medial thigh and her right groin crease. The areas were biopsied with the final pathology report as follows:  Diagnosis  1. 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 Skin , medial right thigh, bx  - BENIGN EPIDERMAL HYPERPLASIA, SEE DESCRIPTION   Dermatologist Dr. 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  Exam today: Physical Exam  Genitourinary:

## 2014-11-08 NOTE — Progress Notes (Signed)
   Patient presented to the office for 3 month follow-up after having been prescribed Aldara topical cream for patient's condyloma acuminatum. Patient was applied 3 times a week for these past 3 months and is here for follow-up. Please see previous note outlining picturing location. Review of her record indicated in August 2013 she had several lesions that patient had noted on her periclitoral region and her medial thigh and her right groin crease. The areas were biopsied with the final pathology report as follows:  Diagnosis  1. 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 Skin , medial right thigh, bx  - BENIGN EPIDERMAL HYPERPLASIA, SEE DESCRIPTION   Dermatologist Dr. 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   Physical Exam  Genitourinary:      New areas on the external genitalia of condylomatous growth were noted as described above. 80% TCA was applied to these areas and patient will return back to the office in 2-3 weeks for possible retreatment and excision of the right inguinal crease exophytic lesion.

## 2014-11-24 ENCOUNTER — Ambulatory Visit (INDEPENDENT_AMBULATORY_CARE_PROVIDER_SITE_OTHER): Payer: BLUE CROSS/BLUE SHIELD | Admitting: Gynecology

## 2014-11-24 ENCOUNTER — Encounter: Payer: Self-pay | Admitting: Gynecology

## 2014-11-24 VITALS — BP 126/84

## 2014-11-24 DIAGNOSIS — A630 Anogenital (venereal) warts: Secondary | ICD-10-CM

## 2014-11-24 NOTE — Progress Notes (Signed)
   Patient presented for follow-up after vulvar condyloma treatment with 80% TCA. History as follows:  Patient 3 months prior to the December 22 visit had been prescribed Aldara topical cream for condyloma acuminatum. She had applied as directed 3 times a week for 3 months as had previously been prescribed. Please see previous note outlining picture and location of condyloma lesion sites.Review of her record indicated in August 2013 she had several lesions that patient had noted on her periclitoral region and her medial thigh and her right groin crease. The areas were biopsied with the final pathology report as follows:   Diagnosis  1. 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 Skin , medial right thigh, bx  - BENIGN EPIDERMAL HYPERPLASIA, SEE DESCRIPTION   Dermatologist Dr. 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  On the office visit of 11/08/2014 she was noted to have perirectal condylomatous lesion as well as one on the right labia majora midportion there were 2 small lesions seen as well as midportion of the left labia majora and a small exophytic growth near the right groin region. These areas were treated with 80% TCA patient presented to the office today and with utilization of magnification lens the entire external genitalia was inspected and the lesions were no longer present. Patient is otherwise scheduled to return back at the end of the year for her annual exam or when necessary.

## 2015-06-19 ENCOUNTER — Other Ambulatory Visit: Payer: Self-pay | Admitting: Gynecology

## 2015-06-19 DIAGNOSIS — Z1231 Encounter for screening mammogram for malignant neoplasm of breast: Secondary | ICD-10-CM

## 2015-07-25 ENCOUNTER — Ambulatory Visit (INDEPENDENT_AMBULATORY_CARE_PROVIDER_SITE_OTHER): Payer: BLUE CROSS/BLUE SHIELD | Admitting: Gynecology

## 2015-07-25 ENCOUNTER — Ambulatory Visit (HOSPITAL_COMMUNITY)
Admission: RE | Admit: 2015-07-25 | Discharge: 2015-07-25 | Disposition: A | Discharge status: Home / Self Care | Payer: BLUE CROSS/BLUE SHIELD | Source: Ambulatory Visit | Attending: Gynecology | Admitting: Gynecology

## 2015-07-25 ENCOUNTER — Encounter: Payer: Self-pay | Admitting: Gynecology

## 2015-07-25 VITALS — BP 132/88 | Ht 63.0 in | Wt 219.0 lb

## 2015-07-25 DIAGNOSIS — Z1231 Encounter for screening mammogram for malignant neoplasm of breast: Secondary | ICD-10-CM | POA: Diagnosis present

## 2015-07-25 DIAGNOSIS — Z01419 Encounter for gynecological examination (general) (routine) without abnormal findings: Secondary | ICD-10-CM

## 2015-07-25 MED ORDER — EST ESTROGENS-METHYLTEST 0.625-1.25 MG PO TABS: 1.0000 | ORAL_TABLET | Freq: Every day | ORAL | Status: DC

## 2015-07-25 NOTE — Progress Notes (Signed)
Desiree Holmes 10/23/1961 [id number]   History:    54 y.o.  for annual gyn exam with no complaints. Patient has done well on Estratest 0.625 mg daily which she takes half a tablet for her vasomotor symptoms. She is a gravida 2 para 1 AB 1 (prior history of total abdominal hysterectomy bilateral salpingo-oophorectomy) patient with past history of condyloma acuminatum of the external genitalia see previous note. No recent recurrence reported.  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 Her PCP is [facility] [place] who has been doing her blood work and monitoring her hypertension. Her last bone density study was in 2014 which was normal. In 2013 she had benign colon polyps removed she is on a 5 year cycle.  Past medical history,surgical history, family history and social history were all reviewed and documented in the EPIC chart.  Gynecologic History No LMP recorded. Patient has had a hysterectomy. Contraception: status post hysterectomy Last Pap: 2015. Results were: normal Last mammogram: Today. Results were: Results pending  Obstetric History OB History  Gravida Para Term Preterm AB SAB TAB Ectopic Multiple Living  # Outcome Date GA Lbr Len/2nd Weight Sex Delivery Anes PTL Lv  3 SAB           2 SAB           1 Preterm     F Vag-Spont  Y Y       ROS: A ROS was performed and pertinent positives and negatives are included in the history.  GENERAL: No fevers or chills. HEENT: No change in vision, no earache, sore throat or sinus congestion. NECK: No pain or stiffness. CARDIOVASCULAR: No chest pain or pressure. No palpitations. PULMONARY: No shortness of breath, cough or wheeze. GASTROINTESTINAL: No abdominal pain, nausea, vomiting or diarrhea, melena or bright red blood per rectum. GENITOURINARY: No urinary frequency, urgency, hesitancy  or dysuria. MUSCULOSKELETAL: No joint or muscle pain, no back pain, no recent trauma. DERMATOLOGIC: No rash, no itching, no lesions. ENDOCRINE: No polyuria, polydipsia, no heat or cold intolerance. No recent change in weight. HEMATOLOGICAL: No anemia or easy bruising or bleeding. NEUROLOGIC: No headache, seizures, numbness, tingling or weakness. PSYCHIATRIC: No depression, no loss of interest in normal activity or change in sleep pattern.     Exam: chaperone present  BP 132/88 mmHg  Ht  (1.6 m)  Wt 219 lb (99.338 kg)  BMI 38.80 kg/m2  Body mass index is 38.8 kg/(m^2).  General appearance : Well developed well nourished female. No acute distress HEENT: Eyes: no retinal hemorrhage or exudates,  Neck supple, trachea midline, no carotid bruits, no thyroidmegaly Lungs: Clear to auscultation, no rhonchi or wheezes, or rib retractions  Heart: Regular rate and rhythm, no murmurs or gallops Breast:Examined in sitting and supine position were symmetrical in appearance, no palpable masses or tenderness,  no skin retraction, no nipple inversion, no nipple discharge, no skin discoloration, no axillary or supraclavicular lymphadenopathy Abdomen: no palpable masses or tenderness, no rebound or guarding Extremities: no edema or skin discoloration or tenderness  Pelvic:  Bartholin, Urethra, Skene Glands: Within normal limits             Vagina: No gross lesions or discharge  Cervix: Absent  Uterus  absent  Adnexa  Without masses or tenderness  Anus  and perineum  normal   Rectovaginal  normal sphincter tone without palpated masses or tenderness             Hemoccult not indicated     Assessment/Plan:  54 y.o. female for annual exam with no recurrence of external genital condyloma. We will follow-up with a Pap smear according to the guidelines in 2018. She was reminded do her monthly breast exam. Prescription refill for Estratest 0.625 mg was provided. Her next bone density study will be due next  year. No Pap smear done today. PCP doing her blood work. I provided with her fecal Hemoccult cards this meant to the office for testing.   Ok Edwards MD, 5:16 PM 07/25/2015

## 2015-08-21 ENCOUNTER — Other Ambulatory Visit: Payer: Self-pay | Admitting: Gynecology

## 2016-03-04 ENCOUNTER — Other Ambulatory Visit: Payer: Self-pay | Admitting: Gynecology

## 2016-03-07 ENCOUNTER — Telehealth: Payer: Self-pay | Admitting: *Deleted

## 2016-03-07 MED ORDER — ESTRADIOL 1 MG PO TABS: 1.0000 mg | ORAL_TABLET | Freq: Every day | ORAL | Status: DC

## 2016-03-07 NOTE — Telephone Encounter (Signed)
Call in prescription for Estrace 1 mg daily #3011 refills

## 2016-03-07 NOTE — Telephone Encounter (Signed)
Pt informed with the below note, Rx sent. 

## 2016-03-07 NOTE — Telephone Encounter (Signed)
Pt called c/o her Estratest 0.625 mg daily price has increased with insurance, pt asked if she could have another Rx similar to estratest. Please advise

## 2016-04-01 DIAGNOSIS — I10 Essential (primary) hypertension: Secondary | ICD-10-CM | POA: Diagnosis not present

## 2016-04-01 DIAGNOSIS — E559 Vitamin D deficiency, unspecified: Secondary | ICD-10-CM | POA: Diagnosis not present

## 2016-04-01 DIAGNOSIS — E785 Hyperlipidemia, unspecified: Secondary | ICD-10-CM | POA: Diagnosis not present

## 2016-04-01 DIAGNOSIS — Z79899 Other long term (current) drug therapy: Secondary | ICD-10-CM | POA: Diagnosis not present

## 2016-04-01 DIAGNOSIS — R7303 Prediabetes: Secondary | ICD-10-CM | POA: Diagnosis not present

## 2016-06-19 ENCOUNTER — Other Ambulatory Visit: Payer: Self-pay | Admitting: Gynecology

## 2016-06-19 DIAGNOSIS — Z1231 Encounter for screening mammogram for malignant neoplasm of breast: Secondary | ICD-10-CM

## 2016-07-09 DIAGNOSIS — K2270 Barrett's esophagus without dysplasia: Secondary | ICD-10-CM | POA: Diagnosis not present

## 2016-07-09 DIAGNOSIS — I10 Essential (primary) hypertension: Secondary | ICD-10-CM | POA: Diagnosis not present

## 2016-07-09 DIAGNOSIS — E785 Hyperlipidemia, unspecified: Secondary | ICD-10-CM | POA: Diagnosis not present

## 2016-07-09 DIAGNOSIS — R7303 Prediabetes: Secondary | ICD-10-CM | POA: Diagnosis not present

## 2016-07-09 DIAGNOSIS — Z79899 Other long term (current) drug therapy: Secondary | ICD-10-CM | POA: Diagnosis not present

## 2016-07-09 DIAGNOSIS — E559 Vitamin D deficiency, unspecified: Secondary | ICD-10-CM | POA: Diagnosis not present

## 2016-07-09 DIAGNOSIS — N951 Menopausal and female climacteric states: Secondary | ICD-10-CM | POA: Diagnosis not present

## 2016-07-25 ENCOUNTER — Encounter: Payer: BLUE CROSS/BLUE SHIELD | Admitting: Gynecology

## 2016-07-31 ENCOUNTER — Encounter: Payer: Self-pay | Admitting: Gynecology

## 2016-07-31 ENCOUNTER — Ambulatory Visit (INDEPENDENT_AMBULATORY_CARE_PROVIDER_SITE_OTHER): Payer: BLUE CROSS/BLUE SHIELD | Admitting: Gynecology

## 2016-07-31 ENCOUNTER — Ambulatory Visit
Admission: RE | Admit: 2016-07-31 | Discharge: 2016-07-31 | Disposition: A | Discharge status: Home / Self Care | Payer: BLUE CROSS/BLUE SHIELD | Source: Ambulatory Visit | Attending: Gynecology | Admitting: Gynecology

## 2016-07-31 VITALS — BP 140/84 | Ht 63.0 in | Wt 205.0 lb

## 2016-07-31 DIAGNOSIS — R454 Irritability and anger: Secondary | ICD-10-CM | POA: Diagnosis not present

## 2016-07-31 DIAGNOSIS — Z780 Asymptomatic menopausal state: Secondary | ICD-10-CM | POA: Diagnosis not present

## 2016-07-31 DIAGNOSIS — Z1231 Encounter for screening mammogram for malignant neoplasm of breast: Secondary | ICD-10-CM | POA: Diagnosis not present

## 2016-07-31 DIAGNOSIS — F419 Anxiety disorder, unspecified: Secondary | ICD-10-CM

## 2016-07-31 DIAGNOSIS — F329 Major depressive disorder, single episode, unspecified: Secondary | ICD-10-CM

## 2016-07-31 DIAGNOSIS — Z79890 Hormone replacement therapy (postmenopausal): Secondary | ICD-10-CM

## 2016-07-31 DIAGNOSIS — Z01419 Encounter for gynecological examination (general) (routine) without abnormal findings: Secondary | ICD-10-CM

## 2016-07-31 DIAGNOSIS — F32A Depression, unspecified: Secondary | ICD-10-CM | POA: Insufficient documentation

## 2016-07-31 MED ORDER — FLUOXETINE HCL 20 MG PO CAPS: 20.0000 mg | ORAL_CAPSULE | Freq: Every day | ORAL | 11 refills | Status: DC

## 2016-07-31 NOTE — Patient Instructions (Signed)
Fluoxetine capsules or tablets (Depression/Mood Disorders) What is this medicine? FLUOXETINE (floo OX e teen) belongs to a class of drugs known as selective serotonin reuptake inhibitors (SSRIs). It helps to treat mood problems such as depression, obsessive compulsive disorder, and panic attacks. It can also treat certain eating disorders. This medicine may be used for other purposes; ask your health care provider or pharmacist if you have questions. What should I tell my health care provider before I take this medicine? They need to know if you have any of these conditions: -bipolar disorder or mania -diabetes -glaucoma -liver disease -psychosis -seizures -suicidal thoughts or history of attempted suicide -an unusual or allergic reaction to fluoxetine, other medicines, foods, dyes, or preservatives -pregnant or trying to get pregnant -breast-feeding How should I use this medicine? Take this medicine by mouth with a glass of water. Follow the directions on the prescription label. You can take this medicine with or without food. Take your medicine at regular intervals. Do not take it more often than directed. Do not stop taking this medicine suddenly except upon the advice of your doctor. Stopping this medicine too quickly may cause serious side effects or your condition may worsen. A special MedGuide will be given to you by the pharmacist with each prescription and refill. Be sure to read this information carefully each time. Talk to your pediatrician regarding the use of this medicine in children. While this drug may be prescribed for children as young as 7 years for selected conditions, precautions do apply. Overdosage: If you think you have taken too much of this medicine contact a poison control center or emergency room at once. NOTE: This medicine is only for you. Do not share this medicine with others. What if I miss a dose? If you miss a dose, skip the missed dose and go back to your  regular dosing schedule. Do not take double or extra doses. What may interact with this medicine? Do not take fluoxetine with any of the following medications: -other medicines containing fluoxetine, like Sarafem or Symbyax -cisapride -linezolid -MAOIs like Carbex, Eldepryl, Marplan, Nardil, and Parnate -methylene blue (injected into a vein) -pimozide -thioridazine This medicine may also interact with the following medications: -alcohol -aspirin and aspirin-like medicines -carbamazepine -certain medicines for depression, anxiety, or psychotic disturbances -certain medicines for migraine headaches like almotriptan, eletriptan, frovatriptan, naratriptan, rizatriptan, sumatriptan, zolmitriptan -digoxin -diuretics -fentanyl -flecainide -furazolidone -isoniazid -lithium -medicines for sleep -medicines that treat or prevent blood clots like warfarin, enoxaparin, and dalteparin -NSAIDs, medicines for pain and inflammation, like ibuprofen or naproxen -phenytoin -procarbazine -propafenone -rasagiline -ritonavir -supplements like St. John's wort, kava kava, valerian -tramadol -tryptophan -vinblastine This list may not describe all possible interactions. Give your health care provider a list of all the medicines, herbs, non-prescription drugs, or dietary supplements you use. Also tell them if you smoke, drink alcohol, or use illegal drugs. Some items may interact with your medicine. What should I watch for while using this medicine? Tell your doctor if your symptoms do not get better or if they get worse. Visit your doctor or health care professional for regular checks on your progress. Because it may take several weeks to see the full effects of this medicine, it is important to continue your treatment as prescribed by your doctor. Patients and their families should watch out for new or worsening thoughts of suicide or depression. Also watch out for sudden changes in feelings such as  feeling anxious, agitated, panicky, irritable, hostile, aggressive, impulsive, severely restless,   overly excited and hyperactive, or not being able to sleep. If this happens, especially at the beginning of treatment or after a change in dose, call your health care professional. Bonita QuinYou may get drowsy or dizzy. Do not drive, use machinery, or do anything that needs mental alertness until you know how this medicine affects you. Do not stand or sit up quickly, especially if you are an older patient. This reduces the risk of dizzy or fainting spells. Alcohol may interfere with the effect of this medicine. Avoid alcoholic drinks. Your mouth may get dry. Chewing sugarless gum or sucking hard candy, and drinking plenty of water may help. Contact your doctor if the problem does not go away or is severe. This medicine may affect blood sugar levels. If you have diabetes, check with your doctor or health care professional before you change your diet or the dose of your diabetic medicine. What side effects may I notice from receiving this medicine? Side effects that you should report to your doctor or health care professional as soon as possible: -allergic reactions like skin rash, itching or hives, swelling of the face, lips, or tongue -breathing problems -confusion -eye pain, changes in vision -fast or irregular heart rate, palpitations -flu-like fever, chills, cough, muscle or joint aches and pains -seizures -suicidal thoughts or other mood changes -swelling or redness in or around the eye -tremors -trouble sleeping -unusual bleeding or bruising -unusually tired or weak -vomiting Side effects that usually do not require medical attention (report to your doctor or health care professional if they continue or are bothersome): -change in sex drive or performance -diarrhea -dry mouth -flushing -headache -increased or decreased appetite -nausea -sweating This list may not describe all possible side  effects. Call your doctor for medical advice about side effects. You may report side effects to FDA at 1-800-FDA-1088. Where should I keep my medicine? Keep out of the reach of children. Store at room temperature between 15 and 30 degrees C (59 and 86 degrees F). Throw away any unused medicine after the expiration date. NOTE: This sheet is a summary. It may not cover all possible information. If you have questions about this medicine, talk to your doctor, pharmacist, or health care provider.    2016, Elsevier/Gold Standard. (2014-10-28 12:40:07) Influenza Virus Vaccine (Flucelvax) What is this medicine? INFLUENZA VIRUS VACCINE (in floo EN zuh VAHY ruhs vak SEEN) helps to reduce the risk of getting influenza also known as the flu. The vaccine only helps protect you against some strains of the flu. This medicine may be used for other purposes; ask your health care provider or pharmacist if you have questions. What should I tell my health care provider before I take this medicine? They need to know if you have any of these conditions: -bleeding disorder like hemophilia -fever or infection -Guillain-Barre syndrome or other neurological problems -immune system problems -infection with the human immunodeficiency virus (HIV) or AIDS -low blood platelet counts -multiple sclerosis -an unusual or allergic reaction to influenza virus vaccine, other medicines, foods, dyes or preservatives -pregnant or trying to get pregnant -breast-feeding How should I use this medicine? This vaccine is for injection into a muscle. It is given by a health care professional. A copy of Vaccine Information Statements will be given before each vaccination. Read this sheet carefully each time. The sheet may change frequently. Talk to your pediatrician regarding the use of this medicine in children. Special care may be needed. Overdosage: If you think you've taken too much of  this medicine contact a poison control center  or emergency room at once. Overdosage: If you think you have taken too much of this medicine contact a poison control center or emergency room at once. NOTE: This medicine is only for you. Do not share this medicine with others. What if I miss a dose? This does not apply. What may interact with this medicine? -chemotherapy or radiation therapy -medicines that lower your immune system like etanercept, anakinra, infliximab, and adalimumab -medicines that treat or prevent blood clots like warfarin -phenytoin -steroid medicines like prednisone or cortisone -theophylline -vaccines This list may not describe all possible interactions. Give your health care provider a list of all the medicines, herbs, non-prescription drugs, or dietary supplements you use. Also tell them if you smoke, drink alcohol, or use illegal drugs. Some items may interact with your medicine. What should I watch for while using this medicine? Report any side effects that do not go away within 3 days to your doctor or health care professional. Call your health care provider if any unusual symptoms occur within 6 weeks of receiving this vaccine. You may still catch the flu, but the illness is not usually as bad. You cannot get the flu from the vaccine. The vaccine will not protect against colds or other illnesses that may cause fever. The vaccine is needed every year. What side effects may I notice from receiving this medicine? Side effects that you should report to your doctor or health care professional as soon as possible: -allergic reactions like skin rash, itching or hives, swelling of the face, lips, or tongue Side effects that usually do not require medical attention (Report these to your doctor or health care professional if they continue or are bothersome.): -fever -headache -muscle aches and pains -pain, tenderness, redness, or swelling at the injection site -tiredness This list may not describe all possible side  effects. Call your doctor for medical advice about side effects. You may report side effects to FDA at 1-800-FDA-1088. Where should I keep my medicine? The vaccine will be given by a health care professional in a clinic, pharmacy, doctor's office, or other health care setting. You will not be given vaccine doses to store at home. NOTE: This sheet is a summary. It may not cover all possible information. If you have questions about this medicine, talk to your doctor, pharmacist, or health care provider.    2016, Elsevier/Gold Standard. (2011-10-16 14:06:47)

## 2016-07-31 NOTE — Progress Notes (Signed)
Desiree Holmes 04-Jul-1961 [id number]   History:    55 y.o.  for annual gyn exam who is complaining of anxiety depression and irritability and mood swings for one week out of the month. She is a single. And her child skin regular college next year and she is very anxious about this. She has a history of a total abdominal hysterectomy bilateral salpingo-oophorectomy and has been on Estrace 1 mg by mouth daily for several years that we should begin weaning later this year. Patient also with past history of condyloma acuminatum of the external genitalia see previous note. No recent recurrence reported.  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 Her PCP is in [facility] who has been doing her blood work and monitoring her hypertension. Her last bone density study was in 2014 which was normal. In 2013 she had benign colon polyps removed and her colonoscopy in 2016 was normal.  Past medical history,surgical history, family history and social history were all reviewed and documented in the EPIC chart.  Gynecologic History No LMP recorded. Patient has had a hysterectomy. Contraception: status post hysterectomy Last Pap: 2012 and 2015. Results were: normal Last mammogram: 2016. Results were: normal  Obstetric History OB History  Gravida Para Term Preterm AB Living  3 1   1 2 1   SAB TAB Ectopic Multiple Live Births  2       1    # Outcome Date GA Lbr Len/2nd Weight Sex Delivery Anes PTL Lv  3 SAB           2 SAB           1 Preterm     F Vag-Spont  Y LIV       ROS: A ROS was performed and pertinent positives and negatives are included in the history.  GENERAL: No fevers or chills. HEENT: No change in vision, no earache, sore throat or sinus congestion. NECK: No pain or stiffness. CARDIOVASCULAR: No chest pain or pressure. No palpitations. PULMONARY: No  shortness of breath, cough or wheeze. GASTROINTESTINAL: No abdominal pain, nausea, vomiting or diarrhea, melena or bright red blood per rectum. GENITOURINARY: No urinary frequency, urgency, hesitancy or dysuria. MUSCULOSKELETAL: No joint or muscle pain, no back pain, no recent trauma. DERMATOLOGIC: No rash, no itching, no lesions. ENDOCRINE: No polyuria, polydipsia, no heat or cold intolerance. No recent change in weight. HEMATOLOGICAL: No anemia or easy bruising or bleeding. NEUROLOGIC: No headache, seizures, numbness, tingling or weakness. PSYCHIATRIC: No depression, no loss of interest in normal activity or change in sleep pattern.     Exam: chaperone present  BP 140/84   Ht 5\' 3"  (1.6 m)   Wt 205 lb (93 kg)   BMI 36.31 kg/m   Body mass index is 36.31 kg/m.  General appearance : Well developed well nourished female. No acute distress HEENT: Eyes: no retinal hemorrhage or exudates,  Neck supple, trachea midline, no carotid bruits, no thyroidmegaly Lungs: Clear to auscultation, no rhonchi or wheezes, or rib retractions  Heart: Regular rate and rhythm, no murmurs or gallops Breast:Examined in sitting and supine position were symmetrical in appearance, no palpable masses or tenderness,  no skin retraction, no nipple inversion, no nipple discharge, no skin discoloration, no axillary or supraclavicular lymphadenopathy Abdomen: no palpable masses or tenderness, no rebound or guarding Extremities: no edema or skin discoloration or  tenderness  Pelvic:  Bartholin, Urethra, Skene Glands: Within normal limits             Vagina: No gross lesions or discharge  Cervix: Absent  Uterus absent  Adnexa  Without masses or tenderness  Anus and perineum  normal   Rectovaginal  normal sphincter tone without palpated masses or tenderness             Hemoccult colonoscopy less than 12 months ago     Assessment/Plan:  55 y.o. 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  I have asked her to begin taking one full tablet every day and on the other days take a half a tablet in next year begin taking half a tablet daily and then the following year to wean off completely. Also because of her irritability and mood swing one week out of the month I'm going to recommend starting her on Prozac 10 mg for 2 weeks on 2 weeks off. If she feels that the symptoms continue through most of the month she will then begin taking one daily. The risks benefits and pros and cons were discussed and literature information was provided. Pap smear no longer indicated according to new guidelines. No recurrence of condyloma noted on external genitalia. We discussed importance of calcium vitamin D and weightbearing exercise for osteoporosis prevention. Patient to receive the flu vaccine today. Her PCP has been doing her blood work.   Ok Edwards MD, 2:55 PM 07/31/2016

## 2016-08-13 DIAGNOSIS — J069 Acute upper respiratory infection, unspecified: Secondary | ICD-10-CM | POA: Diagnosis not present

## 2016-08-13 DIAGNOSIS — B9789 Other viral agents as the cause of diseases classified elsewhere: Secondary | ICD-10-CM | POA: Diagnosis not present

## 2016-08-13 DIAGNOSIS — R030 Elevated blood-pressure reading, without diagnosis of hypertension: Secondary | ICD-10-CM | POA: Diagnosis not present

## 2016-09-28 ENCOUNTER — Other Ambulatory Visit: Payer: Self-pay | Admitting: Gynecology

## 2016-10-17 ENCOUNTER — Ambulatory Visit (INDEPENDENT_AMBULATORY_CARE_PROVIDER_SITE_OTHER): Payer: BLUE CROSS/BLUE SHIELD

## 2016-10-17 ENCOUNTER — Other Ambulatory Visit: Payer: Self-pay | Admitting: Gynecology

## 2016-10-17 DIAGNOSIS — Z13820 Encounter for screening for osteoporosis: Secondary | ICD-10-CM

## 2016-10-17 DIAGNOSIS — Z780 Asymptomatic menopausal state: Secondary | ICD-10-CM

## 2016-10-17 DIAGNOSIS — Z79890 Hormone replacement therapy (postmenopausal): Secondary | ICD-10-CM | POA: Diagnosis not present

## 2016-11-27 DIAGNOSIS — E559 Vitamin D deficiency, unspecified: Secondary | ICD-10-CM | POA: Diagnosis not present

## 2016-11-27 DIAGNOSIS — E785 Hyperlipidemia, unspecified: Secondary | ICD-10-CM | POA: Diagnosis not present

## 2016-11-27 DIAGNOSIS — R7303 Prediabetes: Secondary | ICD-10-CM | POA: Diagnosis not present

## 2016-11-27 DIAGNOSIS — I10 Essential (primary) hypertension: Secondary | ICD-10-CM | POA: Diagnosis not present

## 2017-02-05 ENCOUNTER — Other Ambulatory Visit: Payer: Self-pay | Admitting: *Deleted

## 2017-02-05 MED ORDER — FLUOXETINE HCL 20 MG PO CAPS: 20.0000 mg | ORAL_CAPSULE | Freq: Every day | ORAL | 1 refills | Status: DC

## 2017-03-24 ENCOUNTER — Other Ambulatory Visit: Payer: Self-pay

## 2017-03-24 MED ORDER — FLUOXETINE HCL 20 MG PO CAPS: 20.0000 mg | ORAL_CAPSULE | Freq: Every day | ORAL | 0 refills | Status: DC

## 2017-04-02 ENCOUNTER — Encounter: Payer: Self-pay | Admitting: Gynecology

## 2017-04-10 DIAGNOSIS — Z1211 Encounter for screening for malignant neoplasm of colon: Secondary | ICD-10-CM | POA: Diagnosis not present

## 2017-04-10 DIAGNOSIS — Z1389 Encounter for screening for other disorder: Secondary | ICD-10-CM | POA: Diagnosis not present

## 2017-04-10 DIAGNOSIS — Z79899 Other long term (current) drug therapy: Secondary | ICD-10-CM | POA: Diagnosis not present

## 2017-04-10 DIAGNOSIS — Z1212 Encounter for screening for malignant neoplasm of rectum: Secondary | ICD-10-CM | POA: Diagnosis not present

## 2017-04-10 DIAGNOSIS — E559 Vitamin D deficiency, unspecified: Secondary | ICD-10-CM | POA: Diagnosis not present

## 2017-04-10 DIAGNOSIS — Z0000 Encounter for general adult medical examination without abnormal findings: Secondary | ICD-10-CM | POA: Diagnosis not present

## 2017-04-19 ENCOUNTER — Other Ambulatory Visit: Payer: Self-pay | Admitting: Gynecology

## 2017-05-07 DIAGNOSIS — H524 Presbyopia: Secondary | ICD-10-CM | POA: Diagnosis not present

## 2017-07-07 DIAGNOSIS — R200 Anesthesia of skin: Secondary | ICD-10-CM | POA: Diagnosis not present

## 2017-07-07 DIAGNOSIS — R202 Paresthesia of skin: Secondary | ICD-10-CM | POA: Diagnosis not present

## 2017-07-07 DIAGNOSIS — R7303 Prediabetes: Secondary | ICD-10-CM | POA: Diagnosis not present

## 2017-07-07 DIAGNOSIS — M47816 Spondylosis without myelopathy or radiculopathy, lumbar region: Secondary | ICD-10-CM | POA: Diagnosis not present

## 2017-07-24 ENCOUNTER — Other Ambulatory Visit: Payer: Self-pay | Admitting: Gynecology

## 2017-07-24 DIAGNOSIS — Z1231 Encounter for screening mammogram for malignant neoplasm of breast: Secondary | ICD-10-CM

## 2017-07-29 DIAGNOSIS — Z6841 Body Mass Index (BMI) 40.0 and over, adult: Secondary | ICD-10-CM | POA: Diagnosis not present

## 2017-07-29 DIAGNOSIS — E669 Obesity, unspecified: Secondary | ICD-10-CM | POA: Diagnosis not present

## 2017-08-05 ENCOUNTER — Ambulatory Visit: Payer: BLUE CROSS/BLUE SHIELD

## 2017-08-06 DIAGNOSIS — M5416 Radiculopathy, lumbar region: Secondary | ICD-10-CM | POA: Diagnosis not present

## 2017-08-13 DIAGNOSIS — Z23 Encounter for immunization: Secondary | ICD-10-CM | POA: Diagnosis not present

## 2017-08-18 ENCOUNTER — Ambulatory Visit
Admission: RE | Admit: 2017-08-18 | Discharge: 2017-08-18 | Disposition: A | Discharge status: Home / Self Care | Payer: BLUE CROSS/BLUE SHIELD | Source: Ambulatory Visit | Attending: Gynecology | Admitting: Gynecology

## 2017-08-18 ENCOUNTER — Ambulatory Visit (INDEPENDENT_AMBULATORY_CARE_PROVIDER_SITE_OTHER): Payer: BLUE CROSS/BLUE SHIELD | Admitting: Gynecology

## 2017-08-18 ENCOUNTER — Encounter: Payer: Self-pay | Admitting: Gynecology

## 2017-08-18 VITALS — BP 120/78 | Ht 63.0 in | Wt 224.0 lb

## 2017-08-18 DIAGNOSIS — N952 Postmenopausal atrophic vaginitis: Secondary | ICD-10-CM

## 2017-08-18 DIAGNOSIS — Z1231 Encounter for screening mammogram for malignant neoplasm of breast: Secondary | ICD-10-CM | POA: Diagnosis not present

## 2017-08-18 DIAGNOSIS — Z79890 Hormone replacement therapy (postmenopausal): Secondary | ICD-10-CM | POA: Diagnosis not present

## 2017-08-18 DIAGNOSIS — Z01411 Encounter for gynecological examination (general) (routine) with abnormal findings: Secondary | ICD-10-CM

## 2017-08-18 MED ORDER — ESTRADIOL 1 MG PO TABS: 1.0000 mg | ORAL_TABLET | Freq: Every day | ORAL | 4 refills | Status: DC

## 2017-08-18 NOTE — Progress Notes (Signed)
    Desiree Holmes [birth year] [id number]        56 y.o.  [id number] former patient Dr. Lily Holmes for annual gynecologic exam.    Past medical history,surgical history, problem list, medications, allergies, family history and social history were all reviewed and documented as reviewed in the EPIC chart.  ROS:  Performed with pertinent positives and negatives included in the history, assessment and plan.   Additional significant findings :  None   Exam: Desiree Holmes assistant Vitals:   08/18/17 1504  BP: 120/78  Weight: 224 lb (101.6 kg)  Height:  (1.6 m)   Body mass index is 39.68 kg/m.  General appearance:  Normal affect, orientation and appearance. Skin: Grossly normal HEENT: Without gross lesions.  No cervical or supraclavicular adenopathy. Thyroid normal.  Lungs:  Clear without wheezing, rales or rhonchi Cardiac: RR, without RMG Abdominal:  Soft, nontender, without masses, guarding, rebound, organomegaly or hernia Breasts:  Examined lying and sitting without masses, retractions, discharge or axillary adenopathy. Pelvic:  Ext, BUS, Vagina: With atrophic changes  Adnexa: Without masses or tenderness    Anus and perineum: Normal   Rectovaginal: Normal sphincter tone without palpated masses or tenderness.    Assessment/Plan:  56 y.o. [id number] female for annual gynecologic exam.   1. Postmenopausal/mild atrophic changes. Status post TAH/BSO in the past. On estradiol 1 mg alternating with 0.5 mg daily. Notes she is still having hot flushes and sweats. I reviewed the whole issue of HRT to include the various studies and the risks/benefits. Benefits to include symptom relief impossible cardiovascular and bone health and started early versus risks to include thrombosis such as stroke heart attack DVT and the breast cancer issue was all reviewed. At this point patient would like to continue. Given that she is continuing to have symptoms will increase her to 1 mg daily and see how  she does with this. She'll call me if she continues to have significant symptoms. 2. Mammography today. Will continue with annual mammography next year. Breast exam normal today. 3. DEXA 2017 normal. Plan repeat DEXA at 5 year interval. 4. Pap smear/HPV 2015. No Pap smear done today. No history of significant abnormal Pap smears. 5. Colonoscopy 2016. Did have polyps in the past. Repeat at their recommended interval. 6. Health maintenance. No routine lab work done as this is done through her primary physician's office. Follow up in one year, sooner as needed.   Desiree Lords MD, 3:31 PM 08/18/2017

## 2017-08-18 NOTE — Patient Instructions (Signed)
Followup in one year for annual exam, sooner as needed. 

## 2017-08-27 DIAGNOSIS — M545 Low back pain: Secondary | ICD-10-CM | POA: Diagnosis not present

## 2017-10-13 DIAGNOSIS — I10 Essential (primary) hypertension: Secondary | ICD-10-CM | POA: Diagnosis not present

## 2017-10-13 DIAGNOSIS — R7303 Prediabetes: Secondary | ICD-10-CM | POA: Diagnosis not present

## 2017-10-13 DIAGNOSIS — E785 Hyperlipidemia, unspecified: Secondary | ICD-10-CM | POA: Diagnosis not present

## 2017-10-13 DIAGNOSIS — E559 Vitamin D deficiency, unspecified: Secondary | ICD-10-CM | POA: Diagnosis not present

## 2017-10-13 DIAGNOSIS — R200 Anesthesia of skin: Secondary | ICD-10-CM | POA: Diagnosis not present

## 2017-10-13 DIAGNOSIS — R202 Paresthesia of skin: Secondary | ICD-10-CM | POA: Diagnosis not present

## 2017-12-05 DIAGNOSIS — Z6841 Body Mass Index (BMI) 40.0 and over, adult: Secondary | ICD-10-CM | POA: Diagnosis not present

## 2017-12-05 DIAGNOSIS — E669 Obesity, unspecified: Secondary | ICD-10-CM | POA: Diagnosis not present

## 2018-01-02 DIAGNOSIS — E669 Obesity, unspecified: Secondary | ICD-10-CM | POA: Diagnosis not present

## 2018-01-02 DIAGNOSIS — Z6839 Body mass index (BMI) 39.0-39.9, adult: Secondary | ICD-10-CM | POA: Diagnosis not present

## 2018-01-16 DIAGNOSIS — J029 Acute pharyngitis, unspecified: Secondary | ICD-10-CM | POA: Diagnosis not present

## 2018-01-16 DIAGNOSIS — J101 Influenza due to other identified influenza virus with other respiratory manifestations: Secondary | ICD-10-CM | POA: Diagnosis not present

## 2018-01-30 DIAGNOSIS — R7309 Other abnormal glucose: Secondary | ICD-10-CM | POA: Diagnosis not present

## 2018-01-30 DIAGNOSIS — E669 Obesity, unspecified: Secondary | ICD-10-CM | POA: Diagnosis not present

## 2018-01-30 DIAGNOSIS — Z6838 Body mass index (BMI) 38.0-38.9, adult: Secondary | ICD-10-CM | POA: Diagnosis not present

## 2018-03-10 DIAGNOSIS — J069 Acute upper respiratory infection, unspecified: Secondary | ICD-10-CM | POA: Diagnosis not present

## 2018-03-17 DIAGNOSIS — J060 Acute laryngopharyngitis: Secondary | ICD-10-CM | POA: Diagnosis not present

## 2018-03-17 DIAGNOSIS — Z6838 Body mass index (BMI) 38.0-38.9, adult: Secondary | ICD-10-CM | POA: Diagnosis not present

## 2018-03-17 DIAGNOSIS — I10 Essential (primary) hypertension: Secondary | ICD-10-CM | POA: Diagnosis not present

## 2018-04-29 DIAGNOSIS — J060 Acute laryngopharyngitis: Secondary | ICD-10-CM | POA: Diagnosis not present

## 2018-04-29 DIAGNOSIS — R51 Headache: Secondary | ICD-10-CM | POA: Diagnosis not present

## 2018-05-12 DIAGNOSIS — L918 Other hypertrophic disorders of the skin: Secondary | ICD-10-CM | POA: Diagnosis not present

## 2018-05-12 DIAGNOSIS — L820 Inflamed seborrheic keratosis: Secondary | ICD-10-CM | POA: Diagnosis not present

## 2018-05-17 ENCOUNTER — Other Ambulatory Visit: Payer: Self-pay | Admitting: Gynecology

## 2018-06-17 DIAGNOSIS — K219 Gastro-esophageal reflux disease without esophagitis: Secondary | ICD-10-CM | POA: Diagnosis not present

## 2018-06-17 DIAGNOSIS — I10 Essential (primary) hypertension: Secondary | ICD-10-CM | POA: Diagnosis not present

## 2018-06-17 DIAGNOSIS — E782 Mixed hyperlipidemia: Secondary | ICD-10-CM | POA: Diagnosis not present

## 2018-06-28 DIAGNOSIS — H81399 Other peripheral vertigo, unspecified ear: Secondary | ICD-10-CM | POA: Diagnosis not present

## 2018-07-22 ENCOUNTER — Other Ambulatory Visit: Payer: Self-pay | Admitting: Gynecology

## 2018-07-22 DIAGNOSIS — Z1231 Encounter for screening mammogram for malignant neoplasm of breast: Secondary | ICD-10-CM

## 2018-07-29 DIAGNOSIS — J0100 Acute maxillary sinusitis, unspecified: Secondary | ICD-10-CM | POA: Diagnosis not present

## 2018-08-05 DIAGNOSIS — Z23 Encounter for immunization: Secondary | ICD-10-CM | POA: Diagnosis not present

## 2018-08-17 ENCOUNTER — Other Ambulatory Visit: Payer: Self-pay | Admitting: Gynecology

## 2018-08-28 DIAGNOSIS — J029 Acute pharyngitis, unspecified: Secondary | ICD-10-CM | POA: Diagnosis not present

## 2018-09-21 ENCOUNTER — Ambulatory Visit (INDEPENDENT_AMBULATORY_CARE_PROVIDER_SITE_OTHER): Payer: BLUE CROSS/BLUE SHIELD | Admitting: Gynecology

## 2018-09-21 ENCOUNTER — Ambulatory Visit
Admission: RE | Admit: 2018-09-21 | Discharge: 2018-09-21 | Disposition: A | Discharge status: Home / Self Care | Payer: BLUE CROSS/BLUE SHIELD | Source: Ambulatory Visit | Attending: Gynecology | Admitting: Gynecology

## 2018-09-21 ENCOUNTER — Encounter: Payer: Self-pay | Admitting: Gynecology

## 2018-09-21 VITALS — BP 130/80 | Ht 63.0 in | Wt 222.0 lb

## 2018-09-21 DIAGNOSIS — Z1231 Encounter for screening mammogram for malignant neoplasm of breast: Secondary | ICD-10-CM | POA: Diagnosis not present

## 2018-09-21 DIAGNOSIS — Z79890 Hormone replacement therapy (postmenopausal): Secondary | ICD-10-CM

## 2018-09-21 DIAGNOSIS — N952 Postmenopausal atrophic vaginitis: Secondary | ICD-10-CM

## 2018-09-21 DIAGNOSIS — Z01419 Encounter for gynecological examination (general) (routine) without abnormal findings: Secondary | ICD-10-CM | POA: Diagnosis not present

## 2018-09-21 MED ORDER — ESTRADIOL 1 MG PO TABS: 1.0000 mg | ORAL_TABLET | Freq: Every day | ORAL | 4 refills | Status: DC

## 2018-09-21 NOTE — Progress Notes (Signed)
    [name] 03/06/1961 [id number]        57 y.o.  [id number] for annual gynecologic exam.  Without gynecologic complaints  Past medical history,surgical history, problem list, medications, allergies, family history and social history were all reviewed and documented as reviewed in the EPIC chart.  ROS:  Performed with pertinent positives and negatives included in the history, assessment and plan.   Additional significant findings : None   Exam: [name] assistant Vitals:   09/21/18 1559  BP: 130/80  Weight: 222 lb (100.7 kg)  Height: 5\' 3"  (1.6 m)   Body mass index is 39.33 kg/m.  General appearance:  Normal affect, orientation and appearance. Skin: Grossly normal HEENT: Without gross lesions.  No cervical or supraclavicular adenopathy. Thyroid normal.  Lungs:  Clear without wheezing, rales or rhonchi Cardiac: RR, without RMG Abdominal:  Soft, nontender, without masses, guarding, rebound, organomegaly or hernia Breasts:  Examined lying and sitting without masses, retractions, discharge or axillary adenopathy. Pelvic:  Ext, BUS, Vagina: With atrophic changes.  Pap smear of vaginal cuff done  Adnexa: Without masses or tenderness    Anus and perineum: Normal   Rectovaginal: Normal sphincter tone without palpated masses or tenderness.    Assessment/Plan:  57 y.o. [id number] female for annual gynecologic exam.   1. Postmenopausal/ERT.  Status post TAH/BSO in the past.  On estradiol 1 mg daily.  Try decreasing to 0.5 but had unacceptable hot flushes and sweats.  We again reviewed the risks versus benefits to include increased risk of thrombosis such as stroke heart attack DVT as well as the breast cancer issue.  Benefits as far as symptom relief, cardiovascular and bone health also discussed.  At this point the patient wants to continue and I refilled her x1 year. 2. Pap smear/HPV 07/2014.  Pap smear of vaginal cuff done today.  Options to stop screening per current screening  guidelines based on hysterectomy history and no history of abnormal Pap smears reviewed.  Will readdress on an annual basis. 3. Mammography today.  Breast exam normal today. 4. DEXA 2017 normal.  Plan repeat DEXA at 5-year interval. 5. Colonoscopy 2016.  Repeat at their recommended interval. 6. Health maintenance.  No routine lab work done as patient does this elsewhere.  Follow-up 1 year, sooner as needed.   Dara Lords MD, 4:19 PM 09/21/2018

## 2018-09-21 NOTE — Patient Instructions (Signed)
Follow-up in 1 year for annual exam, sooner if any issues. 

## 2018-09-21 NOTE — Addendum Note (Signed)
Addended by: Dayna Barker on: 09/21/2018 04:33 PM   Modules accepted: Orders

## 2018-09-22 LAB — PAP IG W/ RFLX HPV ASCU

## 2018-12-24 DIAGNOSIS — I10 Essential (primary) hypertension: Secondary | ICD-10-CM | POA: Diagnosis not present

## 2018-12-24 DIAGNOSIS — K219 Gastro-esophageal reflux disease without esophagitis: Secondary | ICD-10-CM | POA: Diagnosis not present

## 2018-12-24 DIAGNOSIS — E782 Mixed hyperlipidemia: Secondary | ICD-10-CM | POA: Diagnosis not present

## 2018-12-24 DIAGNOSIS — J060 Acute laryngopharyngitis: Secondary | ICD-10-CM | POA: Diagnosis not present

## 2019-01-12 DIAGNOSIS — R233 Spontaneous ecchymoses: Secondary | ICD-10-CM | POA: Diagnosis not present

## 2019-01-12 DIAGNOSIS — K2270 Barrett's esophagus without dysplasia: Secondary | ICD-10-CM | POA: Diagnosis not present

## 2019-01-12 DIAGNOSIS — L578 Other skin changes due to chronic exposure to nonionizing radiation: Secondary | ICD-10-CM | POA: Diagnosis not present

## 2019-01-12 DIAGNOSIS — Z79899 Other long term (current) drug therapy: Secondary | ICD-10-CM | POA: Diagnosis not present

## 2019-01-12 DIAGNOSIS — L820 Inflamed seborrheic keratosis: Secondary | ICD-10-CM | POA: Diagnosis not present

## 2019-01-18 DIAGNOSIS — K2270 Barrett's esophagus without dysplasia: Secondary | ICD-10-CM | POA: Diagnosis not present

## 2019-01-18 DIAGNOSIS — Z01818 Encounter for other preprocedural examination: Secondary | ICD-10-CM | POA: Diagnosis not present

## 2019-01-20 DIAGNOSIS — K2270 Barrett's esophagus without dysplasia: Secondary | ICD-10-CM | POA: Diagnosis not present

## 2019-02-05 DIAGNOSIS — K2970 Gastritis, unspecified, without bleeding: Secondary | ICD-10-CM | POA: Diagnosis not present

## 2019-02-05 DIAGNOSIS — A048 Other specified bacterial intestinal infections: Secondary | ICD-10-CM | POA: Diagnosis not present

## 2019-02-05 DIAGNOSIS — K449 Diaphragmatic hernia without obstruction or gangrene: Secondary | ICD-10-CM | POA: Diagnosis not present

## 2019-02-05 DIAGNOSIS — K2270 Barrett's esophagus without dysplasia: Secondary | ICD-10-CM | POA: Diagnosis not present

## 2019-02-08 DIAGNOSIS — Z6841 Body Mass Index (BMI) 40.0 and over, adult: Secondary | ICD-10-CM | POA: Diagnosis not present

## 2019-02-08 DIAGNOSIS — I10 Essential (primary) hypertension: Secondary | ICD-10-CM | POA: Diagnosis not present

## 2019-03-01 DIAGNOSIS — M79672 Pain in left foot: Secondary | ICD-10-CM | POA: Diagnosis not present

## 2019-04-09 DIAGNOSIS — K2270 Barrett's esophagus without dysplasia: Secondary | ICD-10-CM | POA: Diagnosis not present

## 2019-04-09 DIAGNOSIS — K219 Gastro-esophageal reflux disease without esophagitis: Secondary | ICD-10-CM | POA: Diagnosis not present

## 2019-04-09 DIAGNOSIS — A048 Other specified bacterial intestinal infections: Secondary | ICD-10-CM | POA: Diagnosis not present

## 2019-04-09 DIAGNOSIS — K449 Diaphragmatic hernia without obstruction or gangrene: Secondary | ICD-10-CM | POA: Diagnosis not present

## 2019-04-20 ENCOUNTER — Telehealth: Payer: Self-pay | Admitting: *Deleted

## 2019-04-20 NOTE — Telephone Encounter (Signed)
Patient has been off HRT x 3 weeks decided to just stop HRT for now, reports feeling great.

## 2019-05-24 ENCOUNTER — Other Ambulatory Visit: Payer: Self-pay

## 2019-05-24 ENCOUNTER — Ambulatory Visit (INDEPENDENT_AMBULATORY_CARE_PROVIDER_SITE_OTHER): Payer: BC Managed Care – PPO

## 2019-05-24 ENCOUNTER — Encounter: Payer: Self-pay | Admitting: Podiatry

## 2019-05-24 ENCOUNTER — Other Ambulatory Visit: Payer: Self-pay | Admitting: Podiatry

## 2019-05-24 ENCOUNTER — Ambulatory Visit: Payer: BC Managed Care – PPO | Admitting: Podiatry

## 2019-05-24 VITALS — Temp 98.2°F | Resp 16

## 2019-05-24 DIAGNOSIS — M79672 Pain in left foot: Secondary | ICD-10-CM

## 2019-05-24 DIAGNOSIS — R600 Localized edema: Secondary | ICD-10-CM | POA: Diagnosis not present

## 2019-05-24 DIAGNOSIS — M25571 Pain in right ankle and joints of right foot: Secondary | ICD-10-CM

## 2019-05-24 DIAGNOSIS — M722 Plantar fascial fibromatosis: Secondary | ICD-10-CM | POA: Diagnosis not present

## 2019-05-24 DIAGNOSIS — M216X2 Other acquired deformities of left foot: Secondary | ICD-10-CM

## 2019-05-24 NOTE — Patient Instructions (Signed)

## 2019-05-24 NOTE — Progress Notes (Signed)
  Subjective:  Patient ID: Desiree Holmes, female    DOB: 1961/01/09,  MRN: [id number]  Chief Complaint  Patient presents with  . Foot Pain    Lt bottom heel pain x april; 10/10 AM pain -worse in AM -pt states she has been dx with bone spur and was rx meloxicam (d/c b/c it wasn't helping) -pt also c/o BL ankles swelling x 1 mo ago    58 y.o. female presents with the above complaint. Hx as above.   Review of Systems: Negative except as noted in the HPI. Denies N/V/F/Ch.  Past Medical History:  Diagnosis Date  . Barrett's esophagus   . H. pylori infection   . History of colon polyps     Current Outpatient Medications:  .  calcium carbonate (OS-CAL) 600 MG TABS, Take 600 mg by mouth 2 (two) times daily with a meal., Disp: , Rfl:  .  dexlansoprazole (DEXILANT) 60 MG capsule, Take 60 mg by mouth daily., Disp: , Rfl:  .  estradiol (ESTRACE) 1 MG tablet, Take 1 tablet (1 mg total) by mouth daily., Disp: 90 tablet, Rfl: 4 .  losartan (COZAAR) 25 MG tablet, Take 25 mg by mouth daily.  , Disp: , Rfl:   Social History   Tobacco Use  Smoking Status Never Smoker  Smokeless Tobacco Never Used    Allergies  Allergen Reactions  . Sulfa Antibiotics Hives   Objective:   Vitals:   05/24/19 1647  Resp: 16  Temp: 98.2 F (36.8 C)   There is no height or weight on file to calculate BMI. Constitutional Well developed. Well nourished.  Vascular Dorsalis pedis pulses palpable bilaterally. Posterior tibial pulses palpable bilaterally. Capillary refill normal to all digits.  No cyanosis or clubbing noted. Pedal hair growth normal.  Neurologic Normal speech. Oriented to person, place, and time. Epicritic sensation to light touch grossly present bilaterally.  Dermatologic Nails well groomed and normal in appearance. No open wounds. No skin lesions.  Orthopedic: Normal joint ROM without pain or crepitus bilaterally. No visible deformities. Tender to palpation at the calcaneal  tuber left. No pain with calcaneal squeeze left. Ankle ROM diminished range of motion bilaterally. Silfverskiold Test: positive bilaterally.   Radiographs: Taken and reviewed. No acute fractures or dislocations. No evidence of stress fracture.  Plantar heel spur present. Posterior heel spur present.   Assessment:   1. Plantar fasciitis   2. Acquired equinus deformity of left foot   3. Localized edema   4. Pain of left heel    Plan:  Patient was evaluated and treated and all questions answered.  Plantar Fasciitis, left - XR reviewed as above.  - Educated on icing and stretching. Instructions given.  - Injection delivered to the plantar fascia as below. - DME: Night splint dispensed, PF brace dispensed.  Procedure: Injection Tendon/Ligament Location: Left plantar fascia at the glabrous junction; medial approach. Skin Prep: alcohol Injectate: 1 cc 0.5% marcaine plain, 1 cc dexamethasone phosphate, 0.5 cc kenalog 10. Disposition: Patient tolerated procedure well. Injection site dressed with a band-aid.  Pitting edema, Venous Insufficiency -Educated on etiology, also discussed with her talking to PCP about diuretic given pitting edema  Return in about 3 weeks (around 06/14/2019).

## 2019-05-25 ENCOUNTER — Other Ambulatory Visit: Payer: Self-pay | Admitting: Podiatry

## 2019-05-25 DIAGNOSIS — M25571 Pain in right ankle and joints of right foot: Secondary | ICD-10-CM

## 2019-05-25 DIAGNOSIS — R600 Localized edema: Secondary | ICD-10-CM

## 2019-05-25 DIAGNOSIS — I10 Essential (primary) hypertension: Secondary | ICD-10-CM | POA: Diagnosis not present

## 2019-05-25 DIAGNOSIS — M722 Plantar fascial fibromatosis: Secondary | ICD-10-CM

## 2019-05-25 DIAGNOSIS — M79672 Pain in left foot: Secondary | ICD-10-CM

## 2019-05-26 ENCOUNTER — Ambulatory Visit: Payer: BLUE CROSS/BLUE SHIELD | Admitting: Sports Medicine

## 2019-05-31 DIAGNOSIS — R799 Abnormal finding of blood chemistry, unspecified: Secondary | ICD-10-CM | POA: Diagnosis not present

## 2019-06-14 ENCOUNTER — Other Ambulatory Visit: Payer: Self-pay

## 2019-06-14 ENCOUNTER — Ambulatory Visit: Payer: BC Managed Care – PPO | Admitting: Podiatry

## 2019-06-14 ENCOUNTER — Encounter: Payer: Self-pay | Admitting: Podiatry

## 2019-06-14 VITALS — Temp 98.3°F | Resp 16

## 2019-06-14 DIAGNOSIS — M722 Plantar fascial fibromatosis: Secondary | ICD-10-CM | POA: Diagnosis not present

## 2019-06-14 DIAGNOSIS — M216X2 Other acquired deformities of left foot: Secondary | ICD-10-CM | POA: Diagnosis not present

## 2019-06-14 DIAGNOSIS — M2142 Flat foot [pes planus] (acquired), left foot: Secondary | ICD-10-CM | POA: Diagnosis not present

## 2019-06-14 DIAGNOSIS — M2141 Flat foot [pes planus] (acquired), right foot: Secondary | ICD-10-CM

## 2019-06-20 NOTE — Progress Notes (Signed)
  Subjective:  Patient ID: Desiree Holmes, female    DOB: 1961/09/06,  MRN: [id number]  Chief Complaint  Patient presents with  . Plantar Fasciitis    F/U Lt PF Pt. states," 85% better, still with tender spot on it; 5/10 pain." -pt denies redness/swelling Tx: PF brace, NS, stretching and frozen bottle   58 y.o. female presents with the above complaint. Hx as above.   Review of Systems: Negative except as noted in the HPI. Denies N/V/F/Ch.  Past Medical History:  Diagnosis Date  . Barrett's esophagus   . H. pylori infection   . History of colon polyps     Current Outpatient Medications:  .  calcium carbonate (OS-CAL) 600 MG TABS, Take 600 mg by mouth 2 (two) times daily with a meal., Disp: , Rfl:  .  dexlansoprazole (DEXILANT) 60 MG capsule, Take 60 mg by mouth daily., Disp: , Rfl:  .  estradiol (ESTRACE) 1 MG tablet, Take 1 tablet (1 mg total) by mouth daily., Disp: 90 tablet, Rfl: 4 .  hydrochlorothiazide (HYDRODIURIL) 25 MG tablet, TK 1 T PO QD, Disp: , Rfl:  .  losartan (COZAAR) 25 MG tablet, Take 25 mg by mouth daily.  , Disp: , Rfl:  .  losartan (COZAAR) 50 MG tablet, TK 1 T PO  QD, Disp: , Rfl:  .  omeprazole (PRILOSEC) 40 MG capsule, TK ONE C PO  D, Disp: , Rfl:   Social History   Tobacco Use  Smoking Status Never Smoker  Smokeless Tobacco Never Used    Allergies  Allergen Reactions  . Sulfa Antibiotics Hives   Objective:   Vitals:   06/14/19 1707  Resp: 16  Temp: 98.3 F (36.8 C)   There is no height or weight on file to calculate BMI. Constitutional Well developed. Well nourished.  Vascular Dorsalis pedis pulses palpable bilaterally. Posterior tibial pulses palpable bilaterally. Capillary refill normal to all digits.  No cyanosis or clubbing noted. Pedal hair growth normal.  Neurologic Normal speech. Oriented to person, place, and time. Epicritic sensation to light touch grossly present bilaterally.  Dermatologic Nails well groomed and  normal in appearance. No open wounds. No skin lesions.  Orthopedic: Normal joint ROM without pain or crepitus bilaterally. No visible deformities. Tender to palpation at the calcaneal tuber left. No pain with calcaneal squeeze left. Ankle ROM diminished range of motion bilaterally. Silfverskiold Test: positive bilaterally.   Radiographs: None today  Assessment:   1. Plantar fasciitis   2. Acquired equinus deformity of left foot   3. Pes planus of both feet    Plan:  Patient was evaluated and treated and all questions answered.  Plantar Fasciitis, left -Improving discussed continued stretching and icing.  Would benefit from custom orthotics scan today will fabricate for patient in follow-up for pickup advised her to call back for appointment should pain worsen    No follow-ups on file.

## 2019-07-05 DIAGNOSIS — R799 Abnormal finding of blood chemistry, unspecified: Secondary | ICD-10-CM | POA: Diagnosis not present

## 2019-07-29 ENCOUNTER — Other Ambulatory Visit: Payer: Self-pay | Admitting: Gynecology

## 2019-07-29 DIAGNOSIS — Z1231 Encounter for screening mammogram for malignant neoplasm of breast: Secondary | ICD-10-CM

## 2019-08-17 ENCOUNTER — Encounter: Payer: Self-pay | Admitting: Gynecology

## 2019-09-22 ENCOUNTER — Other Ambulatory Visit: Payer: Self-pay

## 2019-09-23 ENCOUNTER — Encounter: Payer: Self-pay | Admitting: Gynecology

## 2019-09-23 ENCOUNTER — Ambulatory Visit
Admission: RE | Admit: 2019-09-23 | Discharge: 2019-09-23 | Disposition: A | Discharge status: Home / Self Care | Payer: BC Managed Care – PPO | Source: Ambulatory Visit | Attending: Gynecology | Admitting: Gynecology

## 2019-09-23 ENCOUNTER — Ambulatory Visit (INDEPENDENT_AMBULATORY_CARE_PROVIDER_SITE_OTHER): Payer: BC Managed Care – PPO | Admitting: Gynecology

## 2019-09-23 VITALS — BP 118/76 | Ht 63.0 in | Wt 227.0 lb

## 2019-09-23 DIAGNOSIS — Z23 Encounter for immunization: Secondary | ICD-10-CM

## 2019-09-23 DIAGNOSIS — Z1231 Encounter for screening mammogram for malignant neoplasm of breast: Secondary | ICD-10-CM

## 2019-09-23 DIAGNOSIS — N952 Postmenopausal atrophic vaginitis: Secondary | ICD-10-CM | POA: Diagnosis not present

## 2019-09-23 DIAGNOSIS — Z79890 Hormone replacement therapy (postmenopausal): Secondary | ICD-10-CM | POA: Diagnosis not present

## 2019-09-23 DIAGNOSIS — Z01419 Encounter for gynecological examination (general) (routine) without abnormal findings: Secondary | ICD-10-CM

## 2019-09-23 MED ORDER — ESTRADIOL 1 MG PO TABS: 1.0000 mg | ORAL_TABLET | Freq: Every day | ORAL | 4 refills | Status: DC

## 2019-09-23 NOTE — Progress Notes (Signed)
    Desiree Holmes [id number] 1960/12/11 [id number]        58 y.o.  [id number] for annual gynecologic exam.  Without gynecologic complaints  Past medical history,surgical history, problem list, medications, allergies, family history and social history were all reviewed and documented as reviewed in the EPIC chart.  ROS:  Performed with pertinent positives and negatives included in the history, assessment and plan.   Additional significant findings : None   Exam: [name] assistant Vitals:   09/23/19 1437  BP: 118/76  Weight: 227 lb (103 kg)  Height: 5\' 3"  (1.6 m)   Body mass index is 40.21 kg/m.  General appearance:  Normal affect, orientation and appearance. Skin: Grossly normal HEENT: Without gross lesions.  No cervical or supraclavicular adenopathy. Thyroid normal.  Lungs:  Clear without wheezing, rales or rhonchi Cardiac: RR, without RMG Abdominal:  Soft, nontender, without masses, guarding, rebound, organomegaly or hernia Breasts:  Examined lying and sitting without masses, retractions, discharge or axillary adenopathy. Pelvic:  Ext, BUS, Vagina: With atrophic changes  Adnexa: Without masses or tenderness    Anus and perineum: Normal   Rectovaginal: Normal sphincter tone without palpated masses or tenderness.    Assessment/Plan:  58 y.o. [id number] female for annual gynecologic exam.  Status post TAH/BSO in the past  1. Postmenopausal/HRT.  Continues on estradiol 1 mg daily.  We discussed the risks versus benefits again to include thrombosis such as stroke heart attack DVT in the breast cancer issue.  At this point the patient would like to continue and I refilled her x1 year. 2. Mammography 09/2019.  Continue with annual mammography next year.  Breast exam normal today. 3. Pap smear 2019.  No Pap smear done today.  No history of significant abnormal Pap smears.  Options to stop screening per current screening guidelines reviewed.  Will readdress on annual basis. 4.  Colonoscopy 2016.  Repeat at their recommended interval. 5. DEXA 2017 normal.  Plan repeat DEXA at 5-year interval. 6. Health maintenance.  No routine lab work done as patient does this elsewhere.  Follow-up 1 year, sooner as needed.   Anastasio Auerbach MD, 2:56 PM 09/23/2019

## 2019-09-23 NOTE — Patient Instructions (Signed)
Follow-up 1 year, sooner as needed

## 2019-09-29 ENCOUNTER — Ambulatory Visit (INDEPENDENT_AMBULATORY_CARE_PROVIDER_SITE_OTHER): Payer: BC Managed Care – PPO | Admitting: Podiatry

## 2019-09-29 ENCOUNTER — Other Ambulatory Visit: Payer: Self-pay

## 2019-09-29 DIAGNOSIS — M722 Plantar fascial fibromatosis: Secondary | ICD-10-CM

## 2019-09-29 NOTE — Patient Instructions (Signed)

## 2019-10-04 NOTE — Progress Notes (Signed)
Patient ID: Desiree Holmes, female   DOB: [birth year], 58 y.o.   MRN: [id number] Patient presents for orthotic pick up.  Verbal and written break in and wear instructions given.  Patient will follow up in 4 weeks with Dr March Rummage if symptoms worsen or fail to improve.

## 2019-10-08 DIAGNOSIS — K449 Diaphragmatic hernia without obstruction or gangrene: Secondary | ICD-10-CM | POA: Diagnosis not present

## 2019-10-08 DIAGNOSIS — K219 Gastro-esophageal reflux disease without esophagitis: Secondary | ICD-10-CM | POA: Diagnosis not present

## 2019-10-08 DIAGNOSIS — K635 Polyp of colon: Secondary | ICD-10-CM | POA: Diagnosis not present

## 2019-10-08 DIAGNOSIS — K2270 Barrett's esophagus without dysplasia: Secondary | ICD-10-CM | POA: Diagnosis not present

## 2019-11-17 DIAGNOSIS — J343 Hypertrophy of nasal turbinates: Secondary | ICD-10-CM | POA: Diagnosis not present

## 2019-11-17 DIAGNOSIS — J342 Deviated nasal septum: Secondary | ICD-10-CM | POA: Diagnosis not present

## 2019-11-17 DIAGNOSIS — J3489 Other specified disorders of nose and nasal sinuses: Secondary | ICD-10-CM | POA: Diagnosis not present

## 2019-11-17 DIAGNOSIS — R0981 Nasal congestion: Secondary | ICD-10-CM | POA: Diagnosis not present

## 2019-11-24 DIAGNOSIS — I10 Essential (primary) hypertension: Secondary | ICD-10-CM | POA: Diagnosis not present

## 2019-11-24 DIAGNOSIS — H10023 Other mucopurulent conjunctivitis, bilateral: Secondary | ICD-10-CM | POA: Diagnosis not present

## 2019-12-08 DIAGNOSIS — R490 Dysphonia: Secondary | ICD-10-CM | POA: Diagnosis not present

## 2019-12-08 DIAGNOSIS — Z20828 Contact with and (suspected) exposure to other viral communicable diseases: Secondary | ICD-10-CM | POA: Diagnosis not present

## 2019-12-08 DIAGNOSIS — J069 Acute upper respiratory infection, unspecified: Secondary | ICD-10-CM | POA: Diagnosis not present

## 2019-12-13 DIAGNOSIS — R438 Other disturbances of smell and taste: Secondary | ICD-10-CM | POA: Diagnosis not present

## 2019-12-13 DIAGNOSIS — Z20828 Contact with and (suspected) exposure to other viral communicable diseases: Secondary | ICD-10-CM | POA: Diagnosis not present

## 2020-01-18 DIAGNOSIS — L578 Other skin changes due to chronic exposure to nonionizing radiation: Secondary | ICD-10-CM | POA: Diagnosis not present

## 2020-01-18 DIAGNOSIS — L821 Other seborrheic keratosis: Secondary | ICD-10-CM | POA: Diagnosis not present

## 2020-01-26 ENCOUNTER — Encounter: Payer: Self-pay | Admitting: Physician Assistant

## 2020-01-26 ENCOUNTER — Other Ambulatory Visit: Payer: Self-pay

## 2020-01-26 ENCOUNTER — Ambulatory Visit (INDEPENDENT_AMBULATORY_CARE_PROVIDER_SITE_OTHER): Payer: BC Managed Care – PPO | Admitting: Physician Assistant

## 2020-01-26 VITALS — BP 134/72 | HR 115 | Temp 98.2°F | Resp 16 | Ht 63.0 in | Wt 217.0 lb

## 2020-01-26 DIAGNOSIS — R635 Abnormal weight gain: Secondary | ICD-10-CM | POA: Diagnosis not present

## 2020-01-26 DIAGNOSIS — I10 Essential (primary) hypertension: Secondary | ICD-10-CM | POA: Diagnosis not present

## 2020-01-26 MED ORDER — PHENTERMINE HCL 37.5 MG PO TABS: 37.5000 mg | ORAL_TABLET | Freq: Every day | ORAL | 1 refills | Status: DC

## 2020-01-26 NOTE — Progress Notes (Signed)
Established Patient Office Visit  Subjective:  Patient ID: Desiree Holmes, female    DOB: Sep 16, 1961  Age: 59 y.o. MRN: [id number]  CC:  Chief Complaint  Patient presents with  . hypertension  . Obesity    HPI Desiree Holmes presents for follow up of hyptertension and obesity  Pt currently taking losartan 73m qd for bp - is tolerating med well and bp has been stable at home  Pt here for follow up of weight gain/obesity - she is taking adipex 37.5 mg qd and is doing well - she has lost 16 pounds since last visit Tolerating medication well  Past Medical History:  Diagnosis Date  . Barrett's esophagus   . H. pylori infection   . History of colon polyps     Past Surgical History:  Procedure Laterality Date  . ABDOMINAL HYSTERECTOMY     with BSO  . CESAREAN SECTION     x2  . DIAGNOSTIC LAPAROSCOPY    . DILATION AND CURETTAGE OF UTERUS    . MINI LAP STERILIZATION    . TOTAL ABDOMINAL HYSTERECTOMY W/ BILATERAL SALPINGOOPHORECTOMY    . TUBOPLASTY / TUBOTUBAL ANASTOMOSIS      Family History  Problem Relation Age of Onset  . COPD Father   . Cancer Father        STOMACH  . Diabetes Mother   . Hypertension Mother   . Thyroid disease Mother   . Lupus Sister   . Diabetes Maternal Grandmother   . Heart disease Maternal Grandfather   . Heart disease Paternal Grandfather   . Cancer Paternal Grandfather        COLON  . Breast cancer Neg Hx     Social History   Socioeconomic History  . Marital status: Married    Spouse name: Not on file  . Number of children: Not on file  . Years of education: Not on file  . Highest education level: Not on file  Occupational History  . Not on file  Tobacco Use  . Smoking status: Never Smoker  . Smokeless tobacco: Never Used  Substance and Sexual Activity  . Alcohol use: No    Alcohol/week: 0.0 standard drinks  . Drug use: No  . Sexual activity: Yes    Birth control/protection: Surgical    Comment: 1st  intercourse 89yo-5 partners  Other Topics Concern  . Not on file  Social History Narrative  . Not on file   Social Determinants of Health   Financial Resource Strain:   . Difficulty of Paying Living Expenses: Not on file  Food Insecurity:   . Worried About RCharity fundraiserin the Last Year: Not on file  . Ran Out of Food in the Last Year: Not on file  Transportation Needs:   . Lack of Transportation (Medical): Not on file  . Lack of Transportation (Non-Medical): Not on file  Physical Activity:   . Days of Exercise per Week: Not on file  . Minutes of Exercise per Session: Not on file  Stress:   . Feeling of Stress : Not on file  Social Connections:   . Frequency of Communication with Friends and Family: Not on file  . Frequency of Social Gatherings with Friends and Family: Not on file  . Attends Religious Services: Not on file  . Active Member of Clubs or Organizations: Not on file  . Attends CArchivistMeetings: Not on file  . Marital Status: Not on file  Intimate Partner Violence:   . Fear of Current or Ex-Partner: Not on file  . Emotionally Abused: Not on file  . Physically Abused: Not on file  . Sexually Abused: Not on file     Current Outpatient Medications:  .  calcium carbonate (OS-CAL) 600 MG TABS, Take 600 mg by mouth 2 (two) times daily with a meal., Disp: , Rfl:  .  estradiol (ESTRACE) 1 MG tablet, Take 1 tablet (1 mg total) by mouth daily., Disp: 90 tablet, Rfl: 4 .  losartan (COZAAR) 50 MG tablet, TK 1 T PO  QD, Disp: , Rfl:  .  omeprazole (PRILOSEC) 40 MG capsule, TK ONE C PO  D, Disp: , Rfl:  .  phentermine (ADIPEX-P) 37.5 MG tablet, Take 1 tablet (37.5 mg total) by mouth daily before breakfast., Disp: 30 tablet, Rfl: 1   Allergies  Allergen Reactions  . Sulfa Antibiotics Hives    ROS CONSTITUTIONAL: Negative for chills, fatigue, feverE/N/T: Negative for ear pain, nasal congestion and sore throat.  CARDIOVASCULAR: Negative for chest pain,  dizziness, palpitations and pedal edema.  RESPIRATORY: Negative for recent cough and dyspnea.    PSYCHIATRIC: Negative for sleep disturbance and to question depression screen.  Negative for depression, negative for anhedonia.        Objective:    PHYSICAL EXAM:   VS: BP 134/72   Pulse (!) 115   Temp 98.2 F (36.8 C)   Resp 16   Ht '5\' 3"'  (1.6 m)   Wt 217 lb (98.4 kg)   SpO2 97%   BMI 38.44 kg/m   GEN: Well nourished, well developed, in no acute distress   Cardiac: RRR; no murmurs, rubs, or gallops,no edema - no significant varicosities Respiratory:  normal respiratory rate and pattern with no distress - normal breath sounds with no rales, rhonchi, wheezes or rubs  Psych: euthymic mood, appropriate affect and demeanor  BP 134/72   Pulse (!) 115   Temp 98.2 F (36.8 C)   Resp 16   Ht '5\' 3"'  (1.6 m)   Wt 217 lb (98.4 kg)   SpO2 97%   BMI 38.44 kg/m  Wt Readings from Last 3 Encounters:  01/26/20 217 lb (98.4 kg)  09/23/19 227 lb (103 kg)  09/21/18 222 lb (100.7 kg)     Health Maintenance Due  Topic Date Due  . HIV Screening  05/28/1974  . COLONOSCOPY  05/28/2009    There are no preventive care reminders to display for this patient.  No results found for: TSH No results found for: WBC, HGB, HCT, MCV, PLT No results found for: NA, K, CHLORIDE, CO2, GLUCOSE, BUN, CREATININE, BILITOT, ALKPHOS, AST, ALT, PROT, ALBUMIN, CALCIUM, ANIONGAP, EGFR, GFR No results found for: CHOL No results found for: HDL No results found for: LDLCALC No results found for: TRIG No results found for: CHOLHDL No results found for: HGBA1C    Assessment & Plan:   Problem List Items Addressed This Visit      Cardiovascular and Mediastinum   Benign hypertension - Primary     Other   Weight gain      Meds ordered this encounter  Medications  . phentermine (ADIPEX-P) 37.5 MG tablet    Sig: Take 1 tablet (37.5 mg total) by mouth daily before breakfast.    Dispense:  30 tablet     Refill:  1    Order Specific Question:   Supervising Provider    AnswerRochel Brome [phone]  Follow-up: Return in about 2 months (around 03/27/2020).    SARA R DAVIS, PA-C

## 2020-01-26 NOTE — Assessment & Plan Note (Signed)
Continue current meds 

## 2020-01-26 NOTE — Assessment & Plan Note (Signed)
Continue efforts at diet and exercise Continue meds Follow up in 2 months

## 2020-02-21 IMAGING — MG DIGITAL SCREENING BILAT W/ TOMO W/ CAD
8 series · 8 of 24 positions shown · non-contrast
Comparison: Previous exam(s).

CLINICAL DATA: Screening.

EXAM:
DIGITAL SCREENING BILATERAL MAMMOGRAM WITH TOMO AND CAD

[R CC synth-2D]
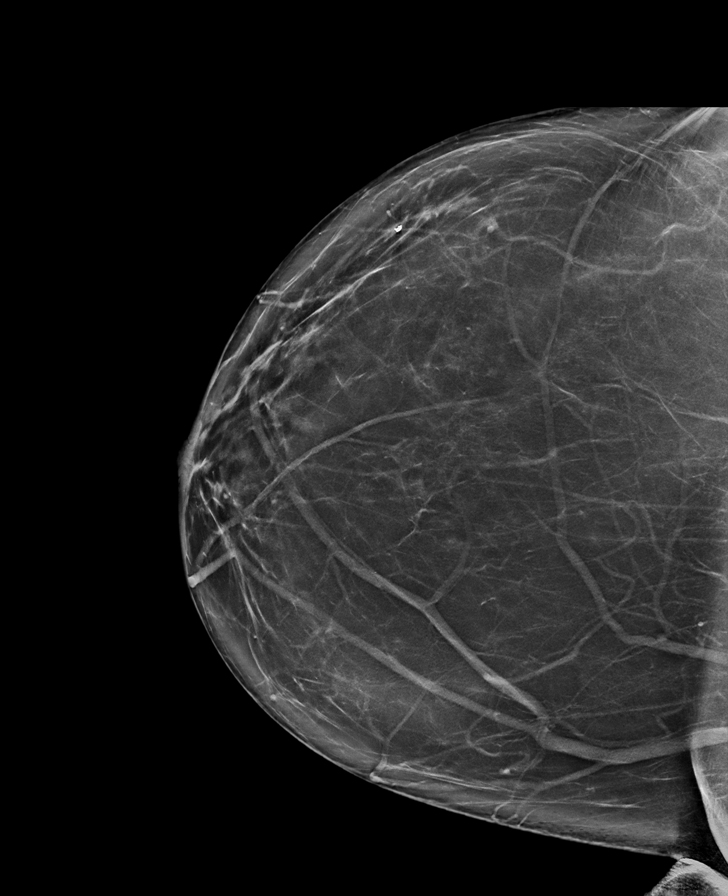

[L CC synth-2D]
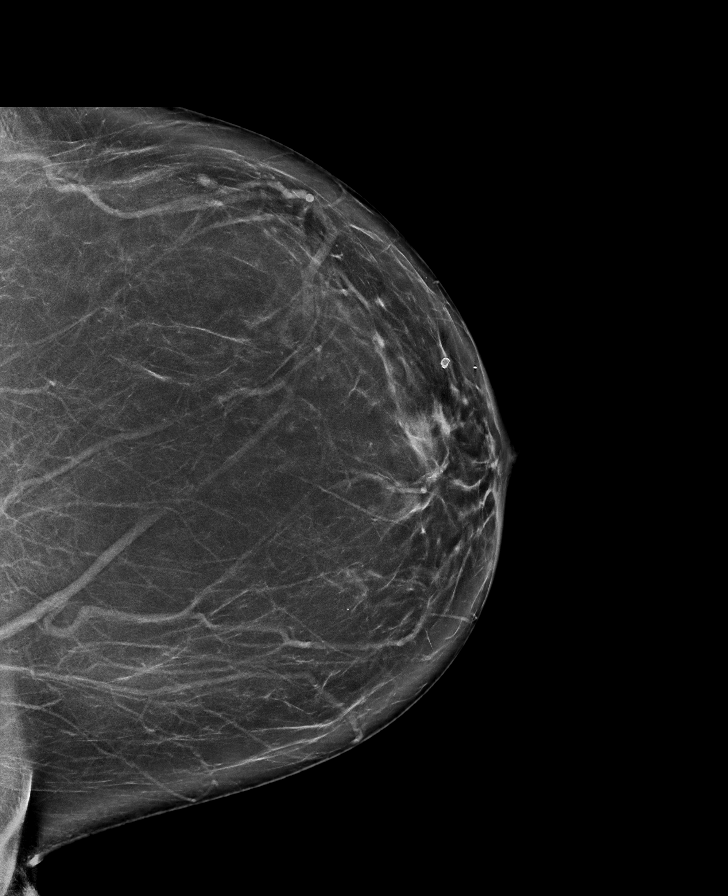

[L MLO synth-2D]
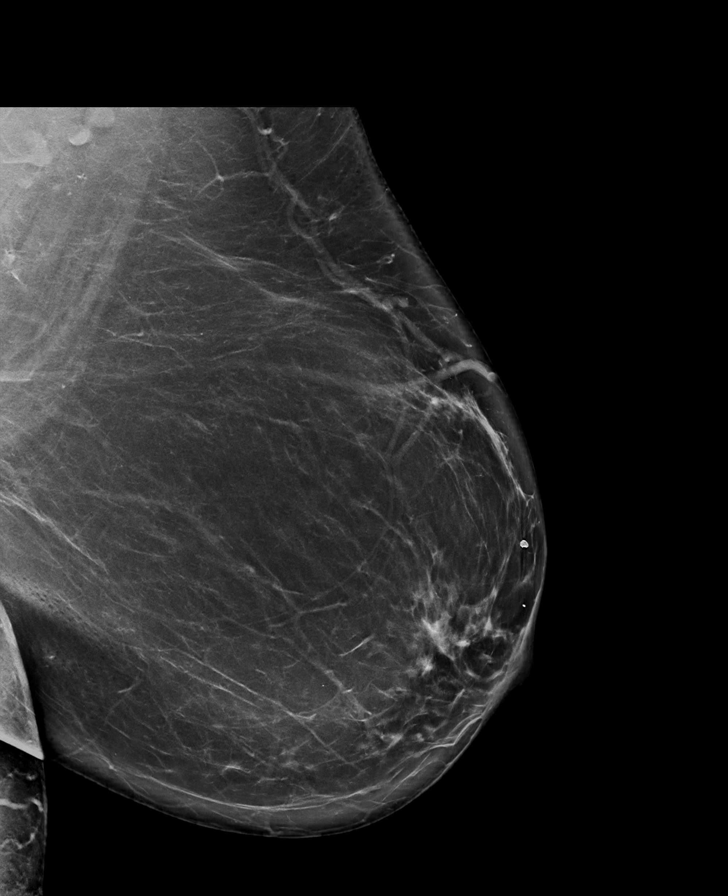

[R MLO synth-2D]
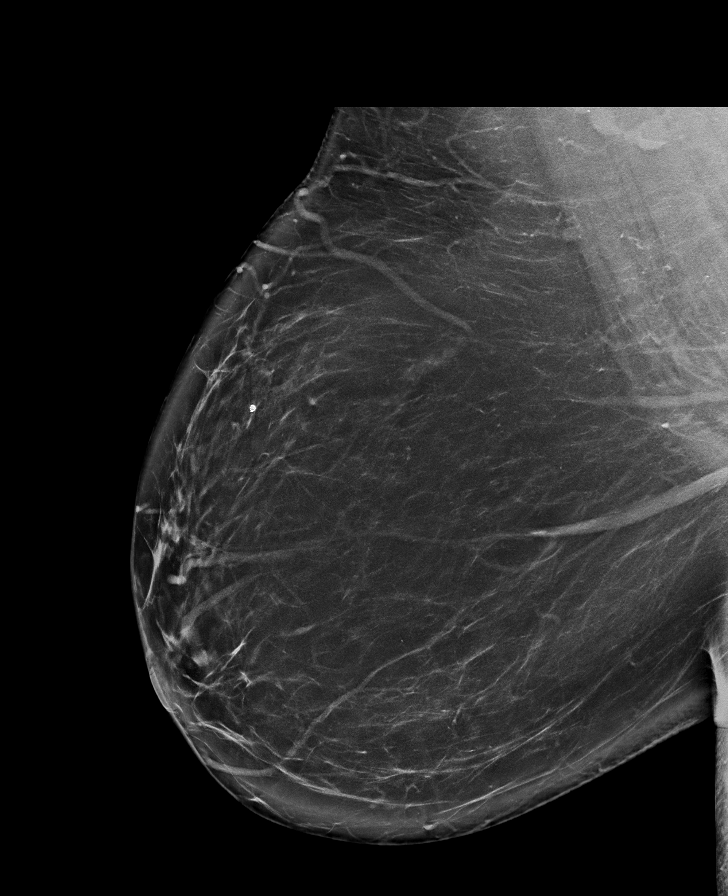

[R MLO tomo · tomo slice 51/100.0]
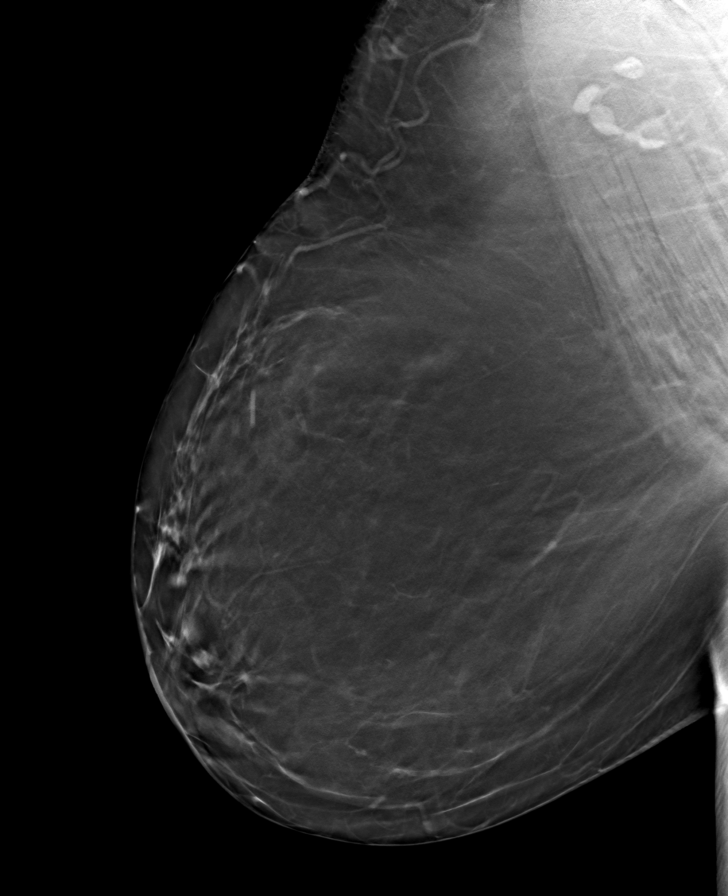

[R CC tomo · tomo slice 45/90.0]
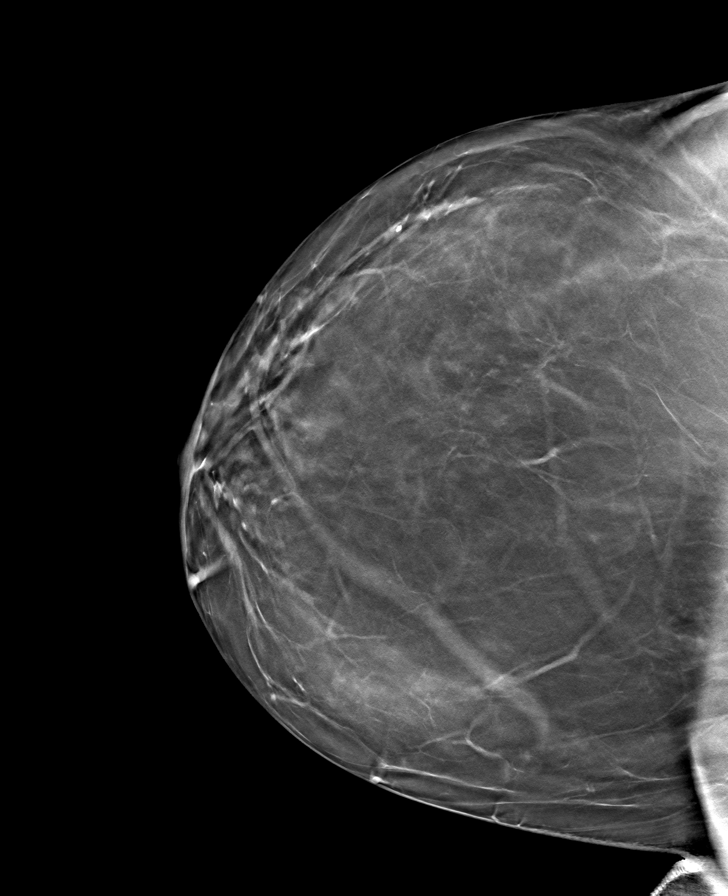

[L MLO tomo · tomo slice 49/97.0]
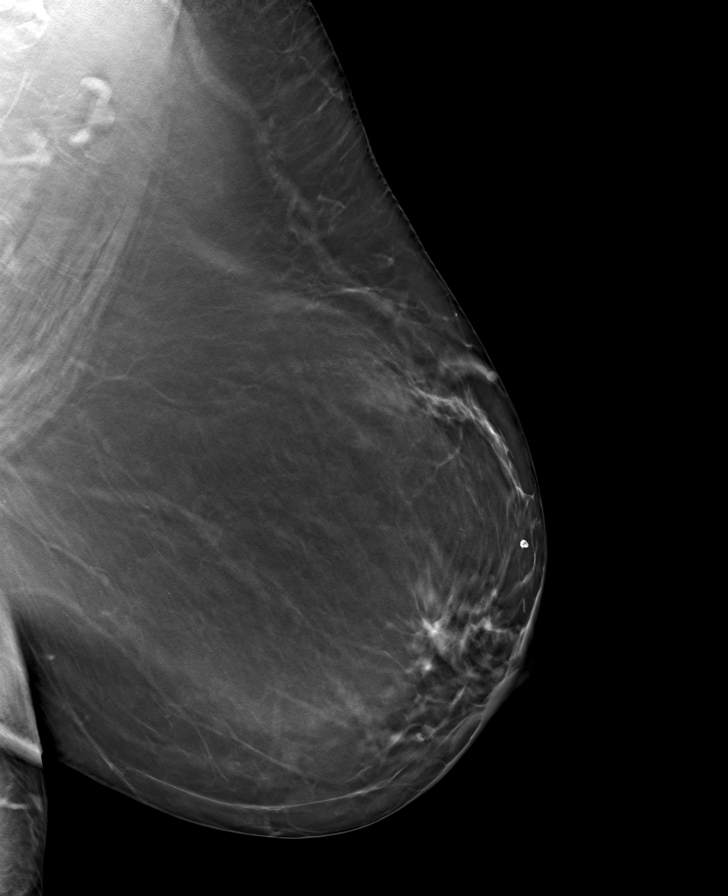

[L CC tomo · tomo slice 45/89.0]
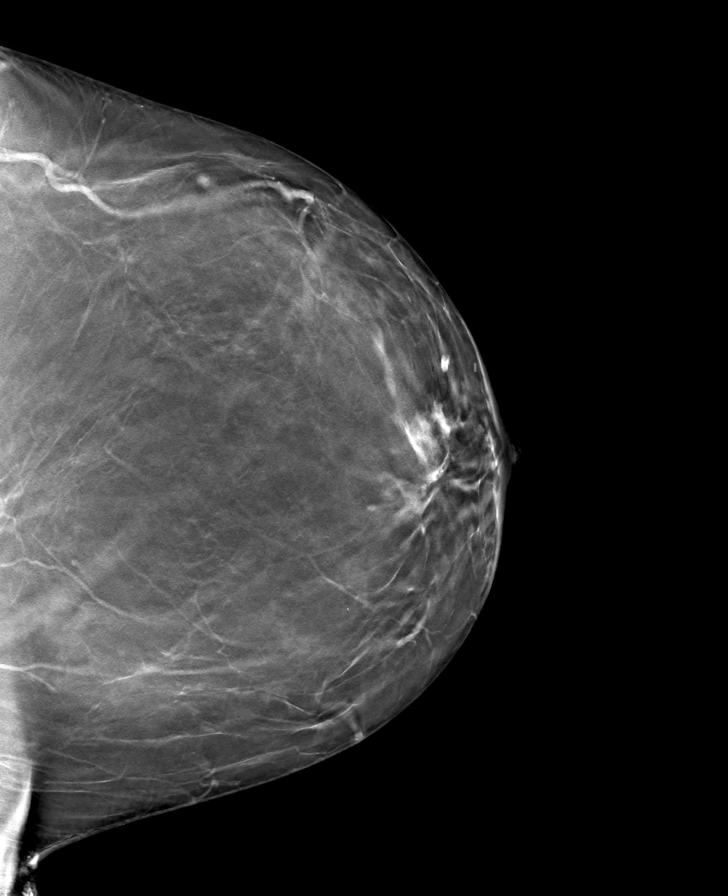

[8 of 24 positions shown; findings below may reference images not displayed]

ACR Breast Density Category b: There are scattered areas of
fibroglandular density.
FINDINGS: There are no findings suspicious for malignancy. Images were
processed with CAD.
IMPRESSION: No mammographic evidence of malignancy. A result letter of this
screening mammogram will be mailed directly to the patient.

RECOMMENDATION:
Screening mammogram in one year. (Code:CN-U-775)

BI-RADS CATEGORY  1: Negative.

## 2020-03-23 ENCOUNTER — Other Ambulatory Visit: Payer: Self-pay

## 2020-03-23 ENCOUNTER — Ambulatory Visit: Payer: BC Managed Care – PPO | Admitting: Physician Assistant

## 2020-03-23 ENCOUNTER — Encounter: Payer: Self-pay | Admitting: Physician Assistant

## 2020-03-23 VITALS — BP 136/78 | HR 107 | Temp 97.2°F | Resp 16 | Wt 206.0 lb

## 2020-03-23 DIAGNOSIS — R635 Abnormal weight gain: Secondary | ICD-10-CM

## 2020-03-23 DIAGNOSIS — I10 Essential (primary) hypertension: Secondary | ICD-10-CM | POA: Diagnosis not present

## 2020-03-23 MED ORDER — PHENTERMINE HCL 37.5 MG PO TABS: 37.5000 mg | ORAL_TABLET | Freq: Every day | ORAL | 1 refills | Status: DC

## 2020-03-23 NOTE — Progress Notes (Signed)
Established Patient Office Visit  Subjective:  Patient ID: Desiree Holmes, female    DOB: 26-May-1961  Age: 59 y.o. MRN: [id number]  CC:  Chief Complaint  Patient presents with  . hypertension  . Obesity    HPI Desiree Holmes presents for follow up of hyptertension and obesity  Pt currently taking losartan 52m qd for bp - is tolerating med well and bp has been stable at home  Pt here for follow up of weight gain/obesity - she is taking adipex 37.5 mg qd and is doing well - she has lost 11 pounds since last visit Tolerating medication well  Past Medical History:  Diagnosis Date  . Barrett's esophagus   . H. pylori infection   . History of colon polyps     Past Surgical History:  Procedure Laterality Date  . ABDOMINAL HYSTERECTOMY     with BSO  . CESAREAN SECTION     x2  . DIAGNOSTIC LAPAROSCOPY    . DILATION AND CURETTAGE OF UTERUS    . MINI LAP STERILIZATION    . TOTAL ABDOMINAL HYSTERECTOMY W/ BILATERAL SALPINGOOPHORECTOMY    . TUBOPLASTY / TUBOTUBAL ANASTOMOSIS      Family History  Problem Relation Age of Onset  . COPD Father   . Cancer Father        STOMACH  . Diabetes Mother   . Hypertension Mother   . Thyroid disease Mother   . Lupus Sister   . Diabetes Maternal Grandmother   . Heart disease Maternal Grandfather   . Heart disease Paternal Grandfather   . Cancer Paternal Grandfather        COLON  . Breast cancer Neg Hx     Social History   Socioeconomic History  . Marital status: Married    Spouse name: Not on file  . Number of children: 1  . Years of education: Not on file  . Highest education level: Not on file  Occupational History  . 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  Tobacco Use  . Smoking status: Never Smoker  . Smokeless tobacco: Never Used  Substance and Sexual Activity  . Alcohol use: No    Alcohol/week: 0.0 standard drinks  . Drug use: No  . Sexual activity: Yes    Birth control/protection: Surgical    Comment: 1st  intercourse 89yo-5 partners  Other Topics Concern  . Not on file  Social History Narrative  . Not on file   Social Determinants of Health   Financial Resource Strain:   . Difficulty of Paying Living Expenses:   Food Insecurity:   . Worried About RCharity fundraiserin the Last Year:   . RArboriculturistin the Last Year:   Transportation Needs:   . LFilm/video editor(Medical):   .Marland KitchenLack of Transportation (Non-Medical):   Physical Activity:   . Days of Exercise per Week:   . Minutes of Exercise per Session:   Stress:   . Feeling of Stress :   Social Connections:   . Frequency of Communication with Friends and Family:   . Frequency of Social Gatherings with Friends and Family:   . Attends Religious Services:   . Active Member of Clubs or Organizations:   . Attends CArchivistMeetings:   .Marland KitchenMarital Status:   Intimate Partner Violence:   . Fear of Current or Ex-Partner:   . Emotionally Abused:   .Marland KitchenPhysically Abused:   . Sexually Abused:  Current Outpatient Medications:  .  calcium carbonate (OS-CAL) 600 MG TABS, Take 600 mg by mouth 2 (two) times daily with a meal., Disp: , Rfl:  .  estradiol (ESTRACE) 1 MG tablet, Take 1 tablet (1 mg total) by mouth daily., Disp: 90 tablet, Rfl: 4 .  losartan (COZAAR) 50 MG tablet, TK 1 T PO  QD, Disp: , Rfl:  .  omeprazole (PRILOSEC) 40 MG capsule, TK ONE C PO  D, Disp: , Rfl:  .  phentermine (ADIPEX-P) 37.5 MG tablet, Take 1 tablet (37.5 mg total) by mouth daily before breakfast., Disp: 30 tablet, Rfl: 1   Allergies  Allergen Reactions  . Sulfa Antibiotics Hives    ROS CONSTITUTIONAL: Negative for chills, fatigue, feverE/N/T: Negative for ear pain, nasal congestion and sore throat.  CARDIOVASCULAR: Negative for chest pain, dizziness, palpitations and pedal edema.  RESPIRATORY: Negative for recent cough and dyspnea.    PSYCHIATRIC: Negative for sleep disturbance and to question depression screen.  Negative for  depression, negative for anhedonia.        Objective:    PHYSICAL EXAM:   VS: BP 136/78   Pulse (!) 107   Temp (!) 97.2 F (36.2 C)   Resp 16   Wt 206 lb (93.4 kg)   SpO2 99%   BMI 36.49 kg/m   GEN: Well nourished, well developed, in no acute distress   Cardiac: RRR; no murmurs, rubs, or gallops,no edema - no significant varicosities Respiratory:  normal respiratory rate and pattern with no distress - normal breath sounds with no rales, rhonchi, wheezes or rubs  Psych: euthymic mood, appropriate affect and demeanor  BP 136/78   Pulse (!) 107   Temp (!) 97.2 F (36.2 C)   Resp 16   Wt 206 lb (93.4 kg)   SpO2 99%   BMI 36.49 kg/m  Wt Readings from Last 3 Encounters:  03/23/20 206 lb (93.4 kg)  01/26/20 217 lb (98.4 kg)  09/23/19 227 lb (103 kg)     Health Maintenance Due  Topic Date Due  . HIV Screening  Never done  . COVID-19 Vaccine (1) Never done  . COLONOSCOPY  Never done    There are no preventive care reminders to display for this patient.  No results found for: TSH No results found for: WBC, HGB, HCT, MCV, PLT No results found for: NA, K, CHLORIDE, CO2, GLUCOSE, BUN, CREATININE, BILITOT, ALKPHOS, AST, ALT, PROT, ALBUMIN, CALCIUM, ANIONGAP, EGFR, GFR No results found for: CHOL No results found for: HDL No results found for: LDLCALC No results found for: TRIG No results found for: CHOLHDL No results found for: HGBA1C    Assessment & Plan:   Problem List Items Addressed This Visit    None      No orders of the defined types were placed in this encounter.   Follow-up: Return in about 2 months (around 05/23/2020). after that visit will take break off medication   SARA R Sonia Bromell, PA-C

## 2020-03-30 DIAGNOSIS — J060 Acute laryngopharyngitis: Secondary | ICD-10-CM | POA: Diagnosis not present

## 2020-04-05 DIAGNOSIS — K2270 Barrett's esophagus without dysplasia: Secondary | ICD-10-CM | POA: Diagnosis not present

## 2020-04-05 DIAGNOSIS — Z20822 Contact with and (suspected) exposure to covid-19: Secondary | ICD-10-CM | POA: Diagnosis not present

## 2020-04-05 DIAGNOSIS — K449 Diaphragmatic hernia without obstruction or gangrene: Secondary | ICD-10-CM | POA: Diagnosis not present

## 2020-04-05 DIAGNOSIS — K219 Gastro-esophageal reflux disease without esophagitis: Secondary | ICD-10-CM | POA: Diagnosis not present

## 2020-04-05 DIAGNOSIS — Z79899 Other long term (current) drug therapy: Secondary | ICD-10-CM | POA: Diagnosis not present

## 2020-05-15 DIAGNOSIS — Z86010 Personal history of colonic polyps: Secondary | ICD-10-CM | POA: Diagnosis not present

## 2020-05-15 DIAGNOSIS — Z1211 Encounter for screening for malignant neoplasm of colon: Secondary | ICD-10-CM | POA: Diagnosis not present

## 2020-05-15 DIAGNOSIS — Z01818 Encounter for other preprocedural examination: Secondary | ICD-10-CM | POA: Diagnosis not present

## 2020-05-15 DIAGNOSIS — K635 Polyp of colon: Secondary | ICD-10-CM | POA: Diagnosis not present

## 2020-05-18 DIAGNOSIS — K635 Polyp of colon: Secondary | ICD-10-CM | POA: Diagnosis not present

## 2020-05-24 ENCOUNTER — Ambulatory Visit: Payer: BC Managed Care – PPO | Admitting: Physician Assistant

## 2020-05-24 ENCOUNTER — Encounter: Payer: Self-pay | Admitting: Physician Assistant

## 2020-05-24 ENCOUNTER — Other Ambulatory Visit: Payer: Self-pay

## 2020-05-24 VITALS — BP 144/86 | HR 74 | Temp 97.5°F | Ht 63.0 in | Wt 196.0 lb

## 2020-05-24 DIAGNOSIS — I10 Essential (primary) hypertension: Secondary | ICD-10-CM | POA: Diagnosis not present

## 2020-05-24 DIAGNOSIS — J060 Acute laryngopharyngitis: Secondary | ICD-10-CM | POA: Diagnosis not present

## 2020-05-24 DIAGNOSIS — R42 Dizziness and giddiness: Secondary | ICD-10-CM | POA: Insufficient documentation

## 2020-05-24 DIAGNOSIS — R635 Abnormal weight gain: Secondary | ICD-10-CM | POA: Diagnosis not present

## 2020-05-24 MED ORDER — CEFDINIR 300 MG PO CAPS: 300.0000 mg | ORAL_CAPSULE | Freq: Two times a day (BID) | ORAL | 0 refills | Status: DC

## 2020-05-24 NOTE — Assessment & Plan Note (Signed)
labwork pending 

## 2020-05-24 NOTE — Assessment & Plan Note (Signed)
labwork pending Continue current meds Recheck bp one week - nurse visit

## 2020-05-24 NOTE — Assessment & Plan Note (Signed)
rx for omnicef and continue otc zyrtec qd

## 2020-05-24 NOTE — Assessment & Plan Note (Signed)
Hold on adipex for now since bp elevated

## 2020-05-24 NOTE — Progress Notes (Addendum)
Established Patient Office Visit  Subjective:  Patient ID: Desiree Holmes, female    DOB: October 03, 1961  Age: 59 y.o. MRN: [id number]  CC:  Chief Complaint  Patient presents with  . hypertension  . Obesity    HPI [employer] presents for follow up of hyptertension and obesity  Pt currently taking losartan 69m qd for bp - is tolerating med well however pt states over the weekend she felt slightly dizzy - is doing better today however bp elevated in office - denies chest pain, sob, edema  Pt here for follow up of weight gain/obesity - she had been taking adipex 37.5 mg qd and is doing well - she has lost 10 pounds since last visit Tolerating medication well  Pt complains of dizziness with ears feeling stopped up and having sinus pressure across face - this has been going on a few days Past Medical History:  Diagnosis Date  . Barrett's esophagus   . H. pylori infection   . History of colon polyps     Past Surgical History:  Procedure Laterality Date  . ABDOMINAL HYSTERECTOMY     with BSO  . CESAREAN SECTION     x2  . DIAGNOSTIC LAPAROSCOPY    . DILATION AND CURETTAGE OF UTERUS    . MINI LAP STERILIZATION    . TOTAL ABDOMINAL HYSTERECTOMY W/ BILATERAL SALPINGOOPHORECTOMY    . TUBOPLASTY / TUBOTUBAL ANASTOMOSIS      Family History  Problem Relation Age of Onset  . COPD Father   . Cancer Father        STOMACH  . Diabetes Mother   . Hypertension Mother   . Thyroid disease Mother   . Lupus Sister   . Diabetes Maternal Grandmother   . Heart disease Maternal Grandfather   . Heart disease Paternal Grandfather   . Cancer Paternal Grandfather        COLON  . Breast cancer Neg Hx     Social History   Socioeconomic History  . Marital status: Married    Spouse name: Not on file  . Number of children: 1  . Years of education: Not on file  . Highest education level: Not on file  Occupational History  . 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  Tobacco Use  .  Smoking status: Never Smoker  . Smokeless tobacco: Never Used  Vaping Use  . Vaping Use: Never used  Substance and Sexual Activity  . Alcohol use: No    Alcohol/week: 0.0 standard drinks  . Drug use: No  . Sexual activity: Yes    Birth control/protection: Surgical    Comment: 1st intercourse 89yo-5 partners  Other Topics Concern  . Not on file  Social History Narrative  . Not on file   Social Determinants of Health   Financial Resource Strain:   . Difficulty of Paying Living Expenses:   Food Insecurity:   . Worried About RCharity fundraiserin the Last Year:   . RArboriculturistin the Last Year:   Transportation Needs:   . LFilm/video editor(Medical):   .Marland KitchenLack of Transportation (Non-Medical):   Physical Activity:   . Days of Exercise per Week:   . Minutes of Exercise per Session:   Stress:   . Feeling of Stress :   Social Connections:   . Frequency of Communication with Friends and Family:   . Frequency of Social Gatherings with Friends and Family:   . Attends Religious Services:   .  Active Member of Clubs or Organizations:   . Attends Archivist Meetings:   Marland Kitchen Marital Status:   Intimate Partner Violence:   . Fear of Current or Ex-Partner:   . Emotionally Abused:   Marland Kitchen Physically Abused:   . Sexually Abused:      Current Outpatient Medications:  .  calcium carbonate (OS-CAL) 600 MG TABS, Take 600 mg by mouth 2 (two) times daily with a meal., Disp: , Rfl:  .  estradiol (ESTRACE) 1 MG tablet, Take 1 tablet (1 mg total) by mouth daily., Disp: 90 tablet, Rfl: 4 .  losartan (COZAAR) 50 MG tablet, TK 1 T PO  QD, Disp: , Rfl:  .  omeprazole (PRILOSEC) 40 MG capsule, TK ONE C PO  D, Disp: , Rfl:  .  phentermine (ADIPEX-P) 37.5 MG tablet, Take 1 tablet (37.5 mg total) by mouth daily before breakfast., Disp: 30 tablet, Rfl: 1 .  cefdinir (OMNICEF) 300 MG capsule, Take 1 capsule (300 mg total) by mouth 2 (two) times daily., Disp: 20 capsule, Rfl: 0   Allergies   Allergen Reactions  . Sulfa Antibiotics Hives    ROS CONSTITUTIONAL: Negative for chills, fatigue, feverE/N/T: Negative for ear pain, nasal congestion and sore throat.  ENT - see HPI CARDIOVASCULAR: Negative for chest pain, dizziness, palpitations and pedal edema.  RESPIRATORY: Negative for recent cough and dyspnea.    PSYCHIATRIC: Negative for sleep disturbance and to question depression screen.  Negative for depression, negative for anhedonia.        Objective:    PHYSICAL EXAM:   VS: BP (!) 144/86 (BP Location: Left Arm, Patient Position: Sitting)   Pulse 74   Temp (!) 97.5 F (36.4 C) (Temporal)   Ht '5\' 3"'  (1.6 m)   Wt 196 lb (88.9 kg)   SpO2 99%   BMI 34.72 kg/m   GEN: Well nourished, well developed, in no acute distress  HEENT - tms retracted with mild air fluid level noted - pharynx with erythema/ond Cardiac: RRR; no murmurs, rubs, or gallops,no edema -  Respiratory:  normal respiratory rate and pattern with no distress - normal breath sounds with no rales, rhonchi, wheezes or rubs  Psych: euthymic mood, appropriate affect and demeanor  BP (!) 144/86 (BP Location: Left Arm, Patient Position: Sitting)   Pulse 74   Temp (!) 97.5 F (36.4 C) (Temporal)   Ht '5\' 3"'  (1.6 m)   Wt 196 lb (88.9 kg)   SpO2 99%   BMI 34.72 kg/m  Wt Readings from Last 3 Encounters:  05/24/20 196 lb (88.9 kg)  03/23/20 206 lb (93.4 kg)  01/26/20 217 lb (98.4 kg)     Health Maintenance Due  Topic Date Due  . COVID-19 Vaccine (1) Never done  . HIV Screening  Never done  . COLONOSCOPY  Never done    There are no preventive care reminders to display for this patient.  No results found for: TSH No results found for: WBC, HGB, HCT, MCV, PLT No results found for: NA, K, CHLORIDE, CO2, GLUCOSE, BUN, CREATININE, BILITOT, ALKPHOS, AST, ALT, PROT, ALBUMIN, CALCIUM, ANIONGAP, EGFR, GFR No results found for: CHOL No results found for: HDL No results found for: LDLCALC No results found  for: TRIG No results found for: CHOLHDL No results found for: HGBA1C    Assessment & Plan:   Problem List Items Addressed This Visit      Cardiovascular and Mediastinum   Benign hypertension - Primary    labwork pending  Continue current meds Recheck bp one week - nurse visit      Relevant Orders   CBC with Differential/Platelet   Comprehensive metabolic panel   TSH   Lipid panel     Respiratory   Acute laryngopharyngitis    rx for omnicef and continue otc zyrtec qd        Other   Weight gain    Hold on adipex for now since bp elevated      Dizziness    labwork pending      Relevant Orders   CBC with Differential/Platelet   Comprehensive metabolic panel   TSH      Meds ordered this encounter  Medications  . cefdinir (OMNICEF) 300 MG capsule    Sig: Take 1 capsule (300 mg total) by mouth 2 (two) times daily.    Dispense:  20 capsule    Refill:  0    Order Specific Question:   Supervising Provider    Answer:   Shelton Silvas    Follow-up: Return in about 1 week (around 05/31/2020) for nurse visit bp check ---. after that visit will take break off medication   SARA R Obryan Radu, PA-C

## 2020-05-25 LAB — COMPREHENSIVE METABOLIC PANEL
ALT: 18 IU/L (ref 0–32)
AST: 19 IU/L (ref 0–40)
Albumin/Globulin Ratio: 1.7 (ref 1.2–2.2)
Albumin: 4 g/dL (ref 3.8–4.9)
Alkaline Phosphatase: 89 IU/L (ref 48–121)
BUN/Creatinine Ratio: 16 (ref 12–28)
BUN: 11 mg/dL (ref 8–27)
Bilirubin Total: 0.6 mg/dL (ref 0.0–1.2)
CO2: 25 mmol/L (ref 20–29)
Calcium: 9.3 mg/dL (ref 8.7–10.3)
Chloride: 104 mmol/L (ref 96–106)
Creatinine, Ser: 0.68 mg/dL (ref 0.57–1.00)
GFR calc Af Amer: 110 mL/min/{1.73_m2} (ref >59)
GFR calc non Af Amer: 95 mL/min/{1.73_m2} (ref >59)
Globulin, Total: 2.3 g/dL (ref 1.5–4.5)
Glucose: 80 mg/dL (ref 65–99)
Potassium: 3.5 mmol/L (ref 3.5–5.2)
Sodium: 140 mmol/L (ref 134–144)
Total Protein: 6.3 g/dL (ref 6.0–8.5)

## 2020-05-25 LAB — LIPID PANEL
Chol/HDL Ratio: 3.6 ratio (ref 0.0–4.4)
Cholesterol, Total: 169 mg/dL (ref 100–199)
HDL: 47 mg/dL (ref >39)
LDL Chol Calc (NIH): 102 mg/dL — ABNORMAL HIGH (ref 0–99)
Triglycerides: 113 mg/dL (ref 0–149)
VLDL Cholesterol Cal: 20 mg/dL (ref 5–40)

## 2020-05-25 LAB — TSH: TSH: 1.73 u[IU]/mL (ref 0.450–4.500)

## 2020-05-25 LAB — CBC WITH DIFFERENTIAL/PLATELET
Basophils Absolute: 0 10*3/uL (ref 0.0–0.2)
Basos: 0 %
EOS (ABSOLUTE): 0.1 10*3/uL (ref 0.0–0.4)
Eos: 1 %
Hematocrit: 43.1 % (ref 34.0–46.6)
Hemoglobin: 14.1 g/dL (ref 11.1–15.9)
Immature Grans (Abs): 0 10*3/uL (ref 0.0–0.1)
Immature Granulocytes: 0 %
Lymphocytes Absolute: 2.9 10*3/uL (ref 0.7–3.1)
Lymphs: 30 %
MCH: 29 pg (ref 26.6–33.0)
MCHC: 32.7 g/dL (ref 31.5–35.7)
MCV: 89 fL (ref 79–97)
Monocytes Absolute: 0.5 10*3/uL (ref 0.1–0.9)
Monocytes: 5 %
Neutrophils Absolute: 6.1 10*3/uL (ref 1.4–7.0)
Neutrophils: 64 %
Platelets: 319 10*3/uL (ref 150–450)
RBC: 4.86 x10E6/uL (ref 3.77–5.28)
RDW: 12.5 % (ref 11.7–15.4)
WBC: 9.6 10*3/uL (ref 3.4–10.8)

## 2020-05-25 LAB — CARDIOVASCULAR RISK ASSESSMENT

## 2020-05-31 ENCOUNTER — Telehealth: Payer: Self-pay

## 2020-05-31 ENCOUNTER — Ambulatory Visit: Payer: BC Managed Care – PPO

## 2020-05-31 ENCOUNTER — Other Ambulatory Visit: Payer: Self-pay

## 2020-05-31 ENCOUNTER — Other Ambulatory Visit: Payer: Self-pay | Admitting: Physician Assistant

## 2020-05-31 VITALS — BP 136/86

## 2020-05-31 DIAGNOSIS — I10 Essential (primary) hypertension: Secondary | ICD-10-CM

## 2020-05-31 MED ORDER — LOSARTAN POTASSIUM 100 MG PO TABS: 100.0000 mg | ORAL_TABLET | Freq: Every day | ORAL | 0 refills | Status: DC

## 2020-05-31 NOTE — Telephone Encounter (Signed)
Recommend to increase losartan to 100mg  qd - rx sent in  (she can take 2 of her 50mg  together) Have her make follow up appt with me in 3 weeks

## 2020-05-31 NOTE — Progress Notes (Signed)
Patient came in to have her b/p checked. Patient is taking Losartan every day at night.

## 2020-05-31 NOTE — Telephone Encounter (Signed)
Patient came in this a.m. for a b/p check. B/P was 136/86 rechecked it and was 140/88. Patient states she is taking her Losartan 50 mg at night.

## 2020-05-31 NOTE — Telephone Encounter (Signed)
error 

## 2020-06-01 NOTE — Telephone Encounter (Signed)
Patient called back confirmed she received our message and has scheduled a 3 week f/u

## 2020-06-01 NOTE — Telephone Encounter (Signed)
Left detailed message informing pt of medication change and to contact our to schedule a 3 week f/u with Kennon Rounds.

## 2020-06-06 DIAGNOSIS — Z86010 Personal history of colonic polyps: Secondary | ICD-10-CM | POA: Diagnosis not present

## 2020-06-06 DIAGNOSIS — K648 Other hemorrhoids: Secondary | ICD-10-CM | POA: Diagnosis not present

## 2020-06-22 ENCOUNTER — Encounter: Payer: Self-pay | Admitting: Physician Assistant

## 2020-06-22 ENCOUNTER — Ambulatory Visit: Payer: BC Managed Care – PPO | Admitting: Physician Assistant

## 2020-06-22 ENCOUNTER — Other Ambulatory Visit: Payer: Self-pay

## 2020-06-22 ENCOUNTER — Other Ambulatory Visit: Payer: Self-pay | Admitting: Physician Assistant

## 2020-06-22 VITALS — BP 112/78 | HR 90 | Temp 97.7°F | Ht 63.0 in | Wt 201.0 lb

## 2020-06-22 DIAGNOSIS — I10 Essential (primary) hypertension: Secondary | ICD-10-CM

## 2020-06-22 MED ORDER — PHENTERMINE HCL 37.5 MG PO TABS: 37.5000 mg | ORAL_TABLET | Freq: Every day | ORAL | 0 refills | Status: DC

## 2020-06-22 NOTE — Progress Notes (Signed)
Established Patient Office Visit  Subjective:  Patient ID: Desiree Holmes, female    DOB: 1961-05-04  Age: 59 y.o. MRN: [id number]  CC:  Chief Complaint  Patient presents with   hypertension       HPI Desiree Holmes presents for follow up of hyptertension   Pt doing well on cozaar 100mg  qd - voices no problems or concerns - denies chest pain/sob States she has not had any edema   Past Medical History:  Diagnosis Date   Barrett's esophagus    H. pylori infection    History of colon polyps     Past Surgical History:  Procedure Laterality Date   ABDOMINAL HYSTERECTOMY     with BSO   CESAREAN SECTION     x2   DIAGNOSTIC LAPAROSCOPY     DILATION AND CURETTAGE OF UTERUS     MINI LAP STERILIZATION     TOTAL ABDOMINAL HYSTERECTOMY W/ BILATERAL SALPINGOOPHORECTOMY     TUBOPLASTY / TUBOTUBAL ANASTOMOSIS      Family History  Problem Relation Age of Onset   COPD Father    Cancer Father        STOMACH   Diabetes Mother    Hypertension Mother    Thyroid disease Mother    Lupus Sister    Diabetes Maternal Grandmother    Heart disease Maternal Grandfather    Heart disease Paternal Grandfather    Cancer Paternal Grandfather        COLON   Breast cancer Neg Hx     Social History   Socioeconomic History   Marital status: Married    Spouse name: Not on file   Number of children: 1   Years of education: Not on file   Highest education level: Not on file  Occupational History   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  Tobacco Use   Smoking status: Never Smoker   Smokeless tobacco: Never Used  Use: Never used  Substance and Sexual Activity   Alcohol use: No    Alcohol/week: 0.0 standard drinks   Drug use: No   Sexual activity: Yes    Birth control/protection: Surgical    Comment: 1st intercourse 22 yo-5 partners  Other Topics Concern   Not on file  Social History Narrative   Not on file   Social  Determinants of Health   Financial Resource Strain:    Difficulty of Paying Living Expenses:   Food Insecurity:    Worried About 15 in the Last Year:    Programme researcher, broadcasting/film/video in the Last Year:   Transportation Needs:    Barista (Medical):    Lack of Transportation (Non-Medical):   Physical Activity:    Days of Exercise per Week:    Minutes of Exercise per Session:   Stress:    Feeling of Stress :   Social Connections:    Frequency of Communication with Friends and Family:    Frequency of Social Gatherings with Friends and Family:    Attends Religious Services:    Active Member of Clubs or Organizations:    Attends Freight forwarder:    Marital Status:   Intimate Partner Violence:    Fear of Current or Ex-Partner:    Emotionally Abused:    Physically Abused:    Sexually Abused:      Current Outpatient Medications:    calcium carbonate (OS-CAL) 600 MG TABS, Take 600 mg by mouth 2 (two)  times daily with a meal., Disp: , Rfl:    estradiol (ESTRACE) 1 MG tablet, Take 1 tablet (1 mg total) by mouth daily., Disp: 90 tablet, Rfl: 4   Loratadine-Pseudoephedrine (EQ ALLERGY RELIEF D 24 HOUR PO), Take by mouth., Disp: , Rfl:    losartan (COZAAR) 100 MG tablet, Take 1 tablet (100 mg total) by mouth daily., Disp: 30 tablet, Rfl: 0   omeprazole (PRILOSEC) 40 MG capsule, TK ONE C PO  D, Disp: , Rfl:    Allergies  Allergen Reactions   Sulfa Antibiotics Hives    ROS CONSTITUTIONAL: Negative for chills, fatigue, feverE/N/T: Negative for ear pain, nasal congestion and sore throat.  CARDIOVASCULAR: Negative for chest pain, dizziness, palpitations and pedal edema.  RESPIRATORY: Negative for recent cough and dyspnea.         Objective:    PHYSICAL EXAM:   VS: BP 112/78 (BP Location: Left Arm, Patient Position: Sitting)    Pulse 90    Temp 97.7 F (36.5 C) (Temporal)    Ht 5\' 3"  (1.6 m)    Wt 201 lb (91.2 kg)    SpO2 99%     BMI 35.61 kg/m   GEN: Well nourished, well developed, in no acute distress  Cardiac: RRR; no murmurs, rubs, or gallops,no edema -  Respiratory:  normal respiratory rate and pattern with no distress - normal breath sounds with no rales, rhonchi, wheezes or rubs  Psych: euthymic mood, appropriate affect and demeanor  BP 112/78 (BP Location: Left Arm, Patient Position: Sitting)    Pulse 90    Temp 97.7 F (36.5 C) (Temporal)    Ht 5\' 3"  (1.6 m)    Wt 201 lb (91.2 kg)    SpO2 99%    BMI 35.61 kg/m  Wt Readings from Last 3 Encounters:  06/22/20 201 lb (91.2 kg)  05/24/20 196 lb (88.9 kg)  03/23/20 206 lb (93.4 kg)     Health Maintenance Due  Topic Date Due   HIV Screening  Never done   COLONOSCOPY  Never done   INFLUENZA VACCINE  06/18/2020    There are no preventive care reminders to display for this patient.  Lab Results  Component Value Date   TSH 1.730 05/24/2020   Lab Results  Component Value Date   WBC 9.6 05/24/2020   HGB 14.1 05/24/2020   HCT 43.1 05/24/2020   MCV 89 05/24/2020   PLT 319 05/24/2020   Lab Results  Component Value Date   NA 140 05/24/2020   K 3.5 05/24/2020   CO2 25 05/24/2020   GLUCOSE 80 05/24/2020   BUN 11 05/24/2020   CREATININE 0.68 05/24/2020   BILITOT 0.6 05/24/2020   ALKPHOS 89 05/24/2020   AST 19 05/24/2020   ALT 18 05/24/2020   PROT 6.3 05/24/2020   ALBUMIN 4.0 05/24/2020   CALCIUM 9.3 05/24/2020   Lab Results  Component Value Date   CHOL 169 05/24/2020   Lab Results  Component Value Date   HDL 47 05/24/2020   Lab Results  Component Value Date   LDLCALC 102 (H) 05/24/2020   Lab Results  Component Value Date   TRIG 113 05/24/2020   Lab Results  Component Value Date   CHOLHDL 3.6 05/24/2020   No results found for: HGBA1C    Assessment & Plan:   Problem List Items Addressed This Visit      Cardiovascular and Mediastinum   Benign hypertension - Primary    Continue current meds and follow  up fasting in 4  months         No orders of the defined types were placed in this encounter.   Follow-up: Return in about 4 months (around 10/22/2020) for chronic fasting follow up. after that visit will take break off medication   SARA R Lonetta Blassingame, PA-C

## 2020-06-22 NOTE — Assessment & Plan Note (Signed)
Continue current meds and follow up fasting in 4 months

## 2020-06-23 ENCOUNTER — Other Ambulatory Visit: Payer: Self-pay | Admitting: Physician Assistant

## 2020-07-02 ENCOUNTER — Other Ambulatory Visit: Payer: Self-pay | Admitting: Physician Assistant

## 2020-07-13 DIAGNOSIS — K5904 Chronic idiopathic constipation: Secondary | ICD-10-CM | POA: Diagnosis not present

## 2020-07-13 DIAGNOSIS — K5730 Diverticulosis of large intestine without perforation or abscess without bleeding: Secondary | ICD-10-CM | POA: Diagnosis not present

## 2020-07-13 DIAGNOSIS — Z1211 Encounter for screening for malignant neoplasm of colon: Secondary | ICD-10-CM | POA: Diagnosis not present

## 2020-07-13 DIAGNOSIS — K219 Gastro-esophageal reflux disease without esophagitis: Secondary | ICD-10-CM | POA: Diagnosis not present

## 2020-08-02 ENCOUNTER — Other Ambulatory Visit: Payer: Self-pay | Admitting: Physician Assistant

## 2020-08-14 ENCOUNTER — Other Ambulatory Visit: Payer: Self-pay | Admitting: Physician Assistant

## 2020-08-14 DIAGNOSIS — Z1231 Encounter for screening mammogram for malignant neoplasm of breast: Secondary | ICD-10-CM

## 2020-08-29 ENCOUNTER — Encounter: Payer: Self-pay | Admitting: Nurse Practitioner

## 2020-08-29 ENCOUNTER — Telehealth (INDEPENDENT_AMBULATORY_CARE_PROVIDER_SITE_OTHER): Payer: BC Managed Care – PPO | Admitting: Nurse Practitioner

## 2020-08-29 VITALS — Temp 97.2°F | Ht 63.0 in | Wt 194.8 lb

## 2020-08-29 DIAGNOSIS — J069 Acute upper respiratory infection, unspecified: Secondary | ICD-10-CM | POA: Diagnosis not present

## 2020-08-29 DIAGNOSIS — R5383 Other fatigue: Secondary | ICD-10-CM

## 2020-08-29 DIAGNOSIS — R5381 Other malaise: Secondary | ICD-10-CM

## 2020-08-29 DIAGNOSIS — Z8701 Personal history of pneumonia (recurrent): Secondary | ICD-10-CM

## 2020-08-29 MED ORDER — AMOXICILLIN 875 MG PO TABS: 875.0000 mg | ORAL_TABLET | Freq: Two times a day (BID) | ORAL | 0 refills | Status: AC

## 2020-08-29 NOTE — Progress Notes (Signed)
Virtual Visit via Telephone Note   This visit type was conducted due to national recommendations for restrictions regarding the COVID-19 Pandemic (e.g. social distancing) in an effort to limit this patient's exposure and mitigate transmission in our community.  Due to her co-morbid illnesses, this patient is at least at moderate risk for complications without adequate follow up.  This format is felt to be most appropriate for this patient at this time.  The patient did not have access to video technology/had technical difficulties with video requiring transitioning to audio format only (telephone).  All issues noted in this document were discussed and addressed.  No physical exam could be performed with this format.  Patient verbally consented to a telehealth visit.   Date:  08/29/2020   ID:  Shaaron Adler, DOB 1960-11-27, MRN [id number]  Patient Location: Home Provider Location: Office/Clinic  PCP:  Marianne Sofia, PA-C   Evaluation Performed:  New Patient Evaluation  Chief Complaint:  Fatigue and Malaise  History of Present Illness:    Raissa Dam is a 59 y.o. female with symptoms of malaise, fatigue, dry cough, and chest "feeling  warm". She states she has a history of COVID-19 in January 2020, bronchitis, and pneumonia. She has not received an influenza or COVID-19 vaccinations. She is concerned with relapse of bronchitis and pneumonia. Bob states she has not knowingly been around sick contacts. She does work at a [employer] where she is in direct contact with multiple people daily.   Rosaisela has a history of seasonal allergies to pollen. She states she takes OTC Zyrtec daily for symptoms.   Takiera has a history of hypertension. She is unable to take several OTC cold remedies due to interaction with BP med.   The patient does have symptoms concerning for COVID-19 infection (fever, chills, cough, or new shortness of breath).    Past Medical History:    Diagnosis Date  . Barrett's esophagus   . H. pylori infection   . History of colon polyps     Past Surgical History:  Procedure Laterality Date  . ABDOMINAL HYSTERECTOMY     with BSO  . CESAREAN SECTION     x2  . DIAGNOSTIC LAPAROSCOPY    . DILATION AND CURETTAGE OF UTERUS    . MINI LAP STERILIZATION    . TOTAL ABDOMINAL HYSTERECTOMY W/ BILATERAL SALPINGOOPHORECTOMY    . TUBOPLASTY / TUBOTUBAL ANASTOMOSIS      Family History  Problem Relation Age of Onset  . COPD Father   . Cancer Father        STOMACH  . Diabetes Mother   . Hypertension Mother   . Thyroid disease Mother   . Lupus Sister   . Diabetes Maternal Grandmother   . Heart disease Maternal Grandfather   . Heart disease Paternal Grandfather   . Cancer Paternal Grandfather        COLON  . Breast cancer Neg Hx     Social History   Socioeconomic History  . Marital status: Married    Spouse name: Not on file  . Number of children: 1  . Years of education: Not on file  . Highest education level: Not on file  Occupational History  . 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  Tobacco Use  . Smoking status: Never Smoker  . Smokeless tobacco: Never Used  Vaping Use  . Vaping Use: Never used  Substance and Sexual Activity  . Alcohol use: No    Alcohol/week: 0.0 standard drinks  .  Drug use: No  . Sexual activity: Yes    Birth control/protection: Surgical    Comment: 1st intercourse 42 yo-5 partners  Other Topics Concern  .   Social History Narrative                                             .   .   .   .   .   .     .   .   .   .     Outpatient Medications Prior to Visit  Medication Sig Dispense Refill  . calcium carbonate (OS-CAL) 600 MG TABS Take 600 mg by mouth 2 (two) times daily with a meal.    . estradiol (ESTRACE) 1 MG tablet Take 1 tablet (1 mg total) by mouth daily. 90 tablet 4  . Loratadine-Pseudoephedrine (EQ ALLERGY RELIEF D 24 HOUR PO) Take by mouth.    . losartan (COZAAR)  100 MG tablet TAKE 1 TABLET(100 MG) BY MOUTH DAILY 30 tablet 1  . omeprazole (PRILOSEC) 40 MG capsule TK ONE C PO  D    . phentermine (ADIPEX-P) 37.5 MG tablet Take 1 tablet (37.5 mg total) by mouth daily before breakfast. 30 tablet 0       Allergies:   Sulfa antibiotics   Social History   Tobacco Use  . Smoking status: Never Smoker  . Smokeless tobacco: Never Used  Vaping Use  . Vaping Use: Never used  Substance Use Topics  . Alcohol use: No    Alcohol/week: 0.0 standard drinks  . Drug use: No     ROS   Labs/Other Tests and Data Reviewed:    Recent Labs: 05/24/2020: ALT 18; BUN 11; Creatinine, Ser 0.68; Hemoglobin 14.1; Platelets 319; Potassium 3.5; Sodium 140; TSH 1.730   Recent Lipid Panel Lab Results  Component Value Date/Time   CHOL 169 05/24/2020 08:39 AM   TRIG 113 05/24/2020 08:39 AM   HDL 47 05/24/2020 08:39 AM   CHOLHDL 3.6 05/24/2020 08:39 AM   LDLCALC 102 (H) 05/24/2020 08:39 AM    Wt Readings from Last 3 Encounters:  08/29/20 194 lb 12.8 oz (88.4 kg)  06/22/20 201 lb (91.2 kg)  05/24/20 196 lb (88.9 kg)     Objective:    Vital Signs:  Temp (!) 97.2 F (36.2 C)   Ht 5\' 3"  (1.6 m)   Wt 194 lb 12.8 oz (88.4 kg)   BMI 34.51 kg/m    Physical Exam No physical exam performed due to tele-med visit  ASSESSMENT & PLAN:       1. Upper respiratory infection, acute - amoxicillin (AMOXIL) 875 MG tablet; Take 1 tablet (875 mg total) by mouth 2 (two) times daily for 5 days.  Dispense: 10 tablet; Refill: 0  2. Malaise and fatigue Rest and push fluids; No work for 3 days  3. History of pneumonia Notify clinic if symptoms worsen or fail to improve  Rest and  Push fluids  Return to work Monday, October 18th, 2021  Call clinic or seek emergency medical care if symptoms worsen  OTC Tylenol and Motrin as needed Take OTC Mucinex   COVID-19 Education: The signs and symptoms of COVID-19 were discussed with the patient and how to seek care for testing  (follow up with PCP or arrange E-visit). The importance of social distancing was discussed today.  Time:  Today, I have spent 20 minutes with the patient with telehealth technology discussing the above problems.    Follow Up:  Virtual Visit  prn  Signed, Janie Morning, NP  08/29/2020 2:56 PM    [facility] [place]

## 2020-08-29 NOTE — Patient Instructions (Signed)
Rest and  Push fluids-Return to work Monday, October 18th, 2021  Call clinic or seek emergency medical care if symptoms worsen  OTC Tylenol and Motrin as needed Take OTC Mucinex   Acute Bronchitis, Adult  Acute bronchitis is when air tubes in the lungs (bronchi) suddenly get swollen. The condition can make it hard for you to breathe. In adults, acute bronchitis usually goes away within 2 weeks. A cough caused by bronchitis may last up to 3 weeks. Smoking, allergies, and asthma can make the condition worse. What are the causes? This condition is caused by:  Cold and flu viruses. The most common cause of this condition is the virus that causes the common cold.  Bacteria.  Substances that irritate the lungs, including: ? Smoke from cigarettes and other types of tobacco. ? Dust and pollen. ? Fumes from chemicals, gases, or burned fuel. ? Other materials that pollute indoor or outdoor air.  Close contact with someone who has acute bronchitis. What increases the risk? The following factors may make you more likely to develop this condition:  A weak body's defense system. This is also called the immune system.  Any condition that affects your lungs and breathing, such as asthma. What are the signs or symptoms? Symptoms of this condition include:  A cough.  Coughing up clear, yellow, or green mucus.  Wheezing.  Chest congestion.  Shortness of breath.  A fever.  Body aches.  Chills.  A sore throat. How is this treated? Acute bronchitis may go away over time without treatment. Your doctor may recommend:  Drinking more fluids.  Taking a medicine for a fever or cough.  Using a device that gets medicine into your lungs (inhaler).  Using a vaporizer or a humidifier. These are machines that add water or moisture in the air to help with coughing and poor breathing. Follow these instructions at home:  Activity  Get a lot of rest.  Avoid places where there are fumes  from chemicals.  Return to your normal activities as told by your doctor. Ask your doctor what activities are safe for you. Lifestyle  Drink enough fluids to keep your pee (urine) pale yellow.  Do not drink alcohol.  Do not use any products that contain nicotine or tobacco, such as cigarettes, e-cigarettes, and chewing tobacco. If you need help quitting, ask your doctor. Be aware that: ? Your bronchitis will get worse if you smoke or breathe in other people's smoke (secondhand smoke). ? Your lungs will heal faster if you quit smoking. General instructions  Take over-the-counter and prescription medicines only as told by your doctor.  Use an inhaler, cool mist vaporizer, or humidifier as told by your doctor.  Rinse your mouth often with salt water. To make salt water, dissolve -1 tsp (3-6 g) of salt in 1 cup (237 mL) of warm water.  Keep all follow-up visits as told by your doctor. This is important. How is this prevented? To lower your risk of getting this condition again:  Wash your hands often with soap and water. If soap and water are not available, use hand sanitizer.  Avoid contact with people who have cold symptoms.  Try not to touch your mouth, nose, or eyes with your hands.  Make sure to get the flu shot every year. Contact a doctor if:  Your symptoms do not get better in 2 weeks.  You vomit more than once or twice.  You have symptoms of loss of fluid from your body (dehydration).  These include: ? Dark urine. ? Dry skin or eyes. ? Increased thirst. ? Headaches. ? Confusion. ? Muscle cramps. Get help right away if:  You cough up blood.  You have chest pain.  You have very bad shortness of breath.  You become dehydrated.  You faint or keep feeling like you are going to faint.  You keep vomiting.  You have a very bad headache.  Your fever or chills get worse. These symptoms may be an emergency. Do not wait to see if the symptoms will go away. Get  medical help right away. Call your local emergency services (911 in the [place] Do not drive yourself to the hospital. Summary  Acute bronchitis is when air tubes in the lungs (bronchi) suddenly get swollen. In adults, acute bronchitis usually goes away within 2 weeks.  Take over-the-counter and prescription medicines only as told by your doctor.  Drink enough fluid to keep your pee (urine) pale yellow.  Contact a doctor if your symptoms do not improve after 2 weeks of treatment.  Get help right away if you cough up blood, faint, or have chest pain or shortness of breath. This information is not intended to replace advice given to you by your health care provider. Make sure you discuss any questions you have with your health care provider. Document Revised: 05/28/2019 Document Reviewed: 05/28/2019 Elsevier Patient Education  2020 Elsevier Inc. Amoxicillin capsules or tablets What is this medicine? AMOXICILLIN (a mox i SIL in) is a penicillin antibiotic. It is used to treat certain kinds of bacterial infections. It will not work for colds, flu, or other viral infections. This medicine may be used for other purposes; ask your health care provider or pharmacist if you have questions. COMMON BRAND NAME(S): Amoxil, Moxilin, Sumox, Trimox What should I tell my health care provider before I take this medicine? They need to know if you have any of these conditions:  kidney disease  an unusual or allergic reaction to amoxicillin, other penicillins, cephalosporin antibiotics, other medicines, foods, dyes, or preservatives  pregnant or trying to get pregnant  breast-feeding How should I use this medicine? Take this medicine by mouth with a glass of water. Follow the directions on your prescription label. You can take it with or without food. If it upsets your stomach, take it with food. Take your medicine at regular intervals. Do not take your medicine more often than directed. Take all of your  medicine as directed even if you think you are better. Do not skip doses or stop your medicine early. Talk to your pediatrician regarding the use of this medicine in children. While this drug may be prescribed for selected conditions, precautions do apply. Overdosage: If you think you have taken too much of this medicine contact a poison control center or emergency room at once. NOTE: This medicine is only for you. Do not share this medicine with others. What if I miss a dose? If you miss a dose, take it as soon as you can. If it is almost time for your next dose, take only that dose. Do not take double or extra doses. What may interact with this medicine?  allopurinol  birth control pills  certain antibiotics like chloramphenicol, erythromycin, sulfamethoxazole, tetracycline  certain medicines that treat or prevent blood clots like warfarin This list may not describe all possible interactions. Give your health care provider a list of all the medicines, herbs, non-prescription drugs, or dietary supplements you use. Also tell them if you smoke,  drink alcohol, or use illegal drugs. Some items may interact with your medicine. What should I watch for while using this medicine? Tell your health care professional if your symptoms do not start to get better or if they get worse. Do not treat diarrhea with over the counter products. Contact your health care professional if you have diarrhea that lasts more than 2 days or if it is severe and watery. If you have diabetes, you may get a false-positive result for sugar in your urine. Check with your health care professional. Birth control may not work properly while you are taking this medicine. Talk to your health care professional about using an extra method of birth control. This medicine may cause serious skin reactions. They can happen weeks to months after starting the medicine. Contact your health care provider right away if you notice fevers or  flu-like symptoms with a rash. The rash may be red or purple and then turn into blisters or peeling of the skin. Or, you might notice a red rash with swelling of the face, lips or lymph nodes in your neck or under your arms. What side effects may I notice from receiving this medicine? Side effects that you should report to your doctor or health care professional as soon as possible:  allergic reactions like skin rash, itching or hives, swelling of the face, lips, or tongue  bloody or watery diarrhea  breathing problems  feeling faint; lightheaded, falls  fever  redness, blistering, peeling or loosening of the skin, including inside the mouth  seizures  signs and symptoms of kidney injury like trouble passing urine or change in the amount of urine  signs and symptoms of liver injury like dark yellow or brown urine; general ill feeling or flu-like symptoms; light-colored stools; loss of appetite; nausea; right upper belly pain; unusually weak or tired; yellowing of the eyes or skin  unusual bleeding or bruising  unusually weak or tired Side effects that usually do not require medical attention (report to your doctor or health care professional if they continue or are bothersome):  anxious  confusion  diarrhea  dizziness  headache  nausea, vomiting  stomach upset  trouble sleeping This list may not describe all possible side effects. Call your doctor for medical advice about side effects. You may report side effects to FDA at 1-800-FDA-1088. Where should I keep my medicine? Keep out of the reach of children. Store at room temperature between 20 and 25 degrees C (68 and 77 degrees F). Throw away any unused medicine after the expiration date. NOTE: This sheet is a summary. It may not cover all possible information. If you have questions about this medicine, talk to your doctor, pharmacist, or health care provider.  2020 Elsevier/Gold Standard (2019-01-15 14:32:29)

## 2020-08-29 NOTE — Progress Notes (Signed)
Acute Office Visit  Subjective:    Patient ID: Desiree Holmes, female    DOB: 05-16-1961, 59 y.o.   MRN: [id number]  Chief Complaint  Patient presents with  . Cold symptoms    Cough/sneeze/fatigued    HPI I connected with  Winfred Burn Monette on 08/29/20 by a video enabled telemedicine application and verified that I am speaking with the correct person using two identifiers.   I discussed the limitations of evaluation and management by telemedicine. The patient expressed understanding and agreed to proceed.    Past Medical History:  Diagnosis Date  . Barrett's esophagus   . H. pylori infection   . History of colon polyps     Past Surgical History:  Procedure Laterality Date  . ABDOMINAL HYSTERECTOMY     with BSO  . CESAREAN SECTION     x2  . DIAGNOSTIC LAPAROSCOPY    . DILATION AND CURETTAGE OF UTERUS    . MINI LAP STERILIZATION    . TOTAL ABDOMINAL HYSTERECTOMY W/ BILATERAL SALPINGOOPHORECTOMY    . TUBOPLASTY / TUBOTUBAL ANASTOMOSIS      Family History  Problem Relation Age of Onset  . COPD Father   . Cancer Father        STOMACH  . Diabetes Mother   . Hypertension Mother   . Thyroid disease Mother   . Lupus Sister   . Diabetes Maternal Grandmother   . Heart disease Maternal Grandfather   . Heart disease Paternal Grandfather   . Cancer Paternal Grandfather        COLON  . Breast cancer Neg Hx     Social History   Socioeconomic History  . Marital status: Married    Spouse name: Not on file  . Number of children: 1  . Years of education: Not on file  . Highest education level: Not on file  Occupational History  . 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  Tobacco Use  . Smoking status: Never Smoker  . Smokeless tobacco: Never Used  Vaping Use  . Vaping Use: Never used  Substance and Sexual Activity  . Alcohol use: No    Alcohol/week: 0.0 standard drinks  . Drug use: No  . Sexual activity: Yes    Birth control/protection: Surgical    Comment:  1st intercourse 12 yo-5 partners  Other Topics Concern  . Not on file  Social History Narrative  . Not on file   Social Determinants of Health   Financial Resource Strain:   . Difficulty of Paying Living Expenses: Not on file  Food Insecurity:   . Worried About Programme researcher, broadcasting/film/video in the Last Year: Not on file  . Ran Out of Food in the Last Year: Not on file  Transportation Needs:   . Lack of Transportation (Medical): Not on file  . Lack of Transportation (Non-Medical): Not on file  Physical Activity:   . Days of Exercise per Week: Not on file  . Minutes of Exercise per Session: Not on file  Stress:   . Feeling of Stress : Not on file  Social Connections:   . Frequency of Communication with Friends and Family: Not on file  . Frequency of Social Gatherings with Friends and Family: Not on file  . Attends Religious Services: Not on file  . Active Member of Clubs or Organizations: Not on file  . Attends Banker Meetings: Not on file  . Marital Status: Not on file  Intimate Partner Violence:   . Fear  of Current or Ex-Partner: Not on file  . Emotionally Abused: Not on file  . Physically Abused: Not on file  . Sexually Abused: Not on file    Outpatient Medications Prior to Visit  Medication Sig Dispense Refill  . calcium carbonate (OS-CAL) 600 MG TABS Take 600 mg by mouth 2 (two) times daily with a meal.    . estradiol (ESTRACE) 1 MG tablet Take 1 tablet (1 mg total) by mouth daily. 90 tablet 4  . Loratadine-Pseudoephedrine (EQ ALLERGY RELIEF D 24 HOUR PO) Take by mouth.    . losartan (COZAAR) 100 MG tablet TAKE 1 TABLET(100 MG) BY MOUTH DAILY 30 tablet 1  . omeprazole (PRILOSEC) 40 MG capsule TK ONE C PO  D    . phentermine (ADIPEX-P) 37.5 MG tablet Take 1 tablet (37.5 mg total) by mouth daily before breakfast. 30 tablet 0   No facility-administered medications prior to visit.    Allergies  Allergen Reactions  . Sulfa Antibiotics Hives    Review of Systems    Constitutional: Positive for fatigue. Negative for fever.  HENT: Positive for postnasal drip and voice change. Negative for congestion, ear pain, sinus pressure, sinus pain and sore throat.   Eyes: Negative for pain.  Respiratory: Positive for cough. Negative for chest tightness, shortness of breath and wheezing.   Cardiovascular: Negative for chest pain and palpitations.  Gastrointestinal: Negative for abdominal pain, constipation, diarrhea, nausea and vomiting.  Genitourinary: Negative for dysuria and hematuria.  Musculoskeletal: Negative for arthralgias, back pain, joint swelling and myalgias.  Skin: Negative for rash.  Neurological: Negative for dizziness, weakness and headaches.  Psychiatric/Behavioral: Negative for dysphoric mood. The patient is not nervous/anxious.        Objective:    Physical Exam  Temp (!) 97.2 F (36.2 C)   Ht 5\' 3"  (1.6 m)   Wt 194 lb 12.8 oz (88.4 kg)   BMI 34.51 kg/m  Wt Readings from Last 3 Encounters:  08/29/20 194 lb 12.8 oz (88.4 kg)  06/22/20 201 lb (91.2 kg)  05/24/20 196 lb (88.9 kg)    Health Maintenance Due  Topic Date Due  . HIV Screening  Never done  . COLONOSCOPY  Never done  . INFLUENZA VACCINE  06/18/2020    There are no preventive care reminders to display for this patient.   Lab Results  Component Value Date   TSH 1.730 05/24/2020   Lab Results  Component Value Date   WBC 9.6 05/24/2020   HGB 14.1 05/24/2020   HCT 43.1 05/24/2020   MCV 89 05/24/2020   PLT 319 05/24/2020   Lab Results  Component Value Date   NA 140 05/24/2020   K 3.5 05/24/2020   CO2 25 05/24/2020   GLUCOSE 80 05/24/2020   BUN 11 05/24/2020   CREATININE 0.68 05/24/2020   BILITOT 0.6 05/24/2020   ALKPHOS 89 05/24/2020   AST 19 05/24/2020   ALT 18 05/24/2020   PROT 6.3 05/24/2020   ALBUMIN 4.0 05/24/2020   CALCIUM 9.3 05/24/2020   Lab Results  Component Value Date   CHOL 169 05/24/2020   Lab Results  Component Value Date   HDL 47  05/24/2020   Lab Results  Component Value Date   LDLCALC 102 (H) 05/24/2020   Lab Results  Component Value Date   TRIG 113 05/24/2020   Lab Results  Component Value Date   CHOLHDL 3.6 05/24/2020   No results found for: HGBA1C       Assessment &  Plan:   There are no diagnoses linked to this encounter.   No orders of the defined types were placed in this encounter.    Precious Reel, CMA

## 2020-09-25 ENCOUNTER — Ambulatory Visit
Admission: RE | Admit: 2020-09-25 | Discharge: 2020-09-25 | Disposition: A | Discharge status: Home / Self Care | Payer: BC Managed Care – PPO | Source: Ambulatory Visit | Attending: Physician Assistant | Admitting: Physician Assistant

## 2020-09-25 ENCOUNTER — Encounter: Payer: Self-pay | Admitting: Obstetrics and Gynecology

## 2020-09-25 ENCOUNTER — Other Ambulatory Visit: Payer: Self-pay

## 2020-09-25 ENCOUNTER — Ambulatory Visit (INDEPENDENT_AMBULATORY_CARE_PROVIDER_SITE_OTHER): Payer: BC Managed Care – PPO | Admitting: Obstetrics and Gynecology

## 2020-09-25 VITALS — BP 124/80 | Ht 62.5 in | Wt 204.0 lb

## 2020-09-25 DIAGNOSIS — Z23 Encounter for immunization: Secondary | ICD-10-CM

## 2020-09-25 DIAGNOSIS — Z1231 Encounter for screening mammogram for malignant neoplasm of breast: Secondary | ICD-10-CM | POA: Diagnosis not present

## 2020-09-25 DIAGNOSIS — Z79890 Hormone replacement therapy (postmenopausal): Secondary | ICD-10-CM

## 2020-09-25 DIAGNOSIS — Z01419 Encounter for gynecological examination (general) (routine) without abnormal findings: Secondary | ICD-10-CM

## 2020-09-25 MED ORDER — ESTRADIOL 1 MG PO TABS: 1.0000 mg | ORAL_TABLET | Freq: Every day | ORAL | 4 refills | Status: DC

## 2020-09-25 NOTE — Progress Notes (Signed)
   Desiree Holmes [id number] 1961/03/12 [id number]  SUBJECTIVE:  59 y.o. [id number] female for annual routine gynecologic exam. She has no gynecologic concerns.  Current Outpatient Medications  Medication Sig Dispense Refill  . calcium carbonate (OS-CAL) 600 MG TABS Take 600 mg by mouth 2 (two) times daily with a meal.    . estradiol (ESTRACE) 1 MG tablet Take 1 tablet (1 mg total) by mouth daily. 90 tablet 4  . Loratadine-Pseudoephedrine (EQ ALLERGY RELIEF D 24 HOUR PO) Take by mouth.    . losartan (COZAAR) 100 MG tablet TAKE 1 TABLET(100 MG) BY MOUTH DAILY 30 tablet 1  . omeprazole (PRILOSEC) 40 MG capsule TK ONE C PO  D    . phentermine (ADIPEX-P) 37.5 MG tablet Take 1 tablet (37.5 mg total) by mouth daily before breakfast. (Patient not taking: Reported on 09/25/2020) 30 tablet 0   No current facility-administered medications for this visit.   Allergies: Sulfa antibiotics  No LMP recorded. Patient has had a hysterectomy.  Past medical history,surgical history, problem list, medications, allergies, family history and social history were all reviewed and documented as reviewed in the EPIC chart.  ROS: Pertinent positives and negatives as reviewed in HPI    OBJECTIVE:  BP 124/80   Ht 5' 2.5" (1.588 m)   Wt 204 lb (92.5 kg)   BMI 36.72 kg/m  The patient appears well, alert, oriented, in no distress. Lungs are clear, good air entry, no wheezes, rhonchi or rales. S1 and S2 normal, no murmurs, regular rate and rhythm.  Abdomen soft without tenderness, guarding, mass or organomegaly.  Neurological is normal, no focal findings.  BREAST EXAM: breasts appear normal, no suspicious masses, no skin or nipple changes or axillary nodes  PELVIC EXAM: VULVA: normal appearing vulva with atrophic change, no masses, tenderness or lesions, VAGINA: normal appearing vagina with atrophic change, normal color and discharge, no lesions, CERVIX: surgically absent, UTERUS: surgically absent, vaginal cuff  normal, ADNEXA: no masses, nontender  Chaperone: [name] present during the examination  ASSESSMENT:  59 y.o. [id number] here for annual gynecologic exam  PLAN:   1. Postmenopausal/HRT. Prior TAH/BSO.   Has been on estradiol 1 mg daily would like to continue.  Helps with her hot flashes.  She understands the risks to include heart attack, stroke, DVT, PE and the breast cancer issue.  Knowing that she does want to continue so I gave her a refill x1 year. 2. Pap smear 2019.  No significant history of abnormal Pap smears.  If she does desire to continue screening, next Pap smear would be due 2022 following the current guidelines recommending the 3 year interval. 3. Mammogram scheduled for after this appointment.  Normal breast exam today.   4. Colonoscopy 2016.  She will follow up at the recommended interval.   5. DEXA 2017 was normal.  Next DEXA recommended 2022 at the 5-year interval. 6. Health maintenance.  No labs today as she normally has these elsewhere.  Return annually or sooner, prn.  Theresia Majors MD 09/25/20

## 2020-10-07 ENCOUNTER — Other Ambulatory Visit: Payer: Self-pay | Admitting: Physician Assistant

## 2020-10-23 ENCOUNTER — Other Ambulatory Visit: Payer: Self-pay | Admitting: Nurse Practitioner

## 2020-10-23 ENCOUNTER — Ambulatory Visit: Payer: BC Managed Care – PPO | Admitting: Physician Assistant

## 2020-10-23 ENCOUNTER — Other Ambulatory Visit: Payer: Self-pay

## 2020-10-23 ENCOUNTER — Encounter: Payer: Self-pay | Admitting: Physician Assistant

## 2020-10-23 VITALS — BP 120/78 | HR 80 | Temp 97.5°F | Ht 62.5 in | Wt 209.4 lb

## 2020-10-23 DIAGNOSIS — B009 Herpesviral infection, unspecified: Secondary | ICD-10-CM

## 2020-10-23 DIAGNOSIS — Z6837 Body mass index (BMI) 37.0-37.9, adult: Secondary | ICD-10-CM

## 2020-10-23 DIAGNOSIS — R635 Abnormal weight gain: Secondary | ICD-10-CM | POA: Diagnosis not present

## 2020-10-23 DIAGNOSIS — K219 Gastro-esophageal reflux disease without esophagitis: Secondary | ICD-10-CM

## 2020-10-23 DIAGNOSIS — K2270 Barrett's esophagus without dysplasia: Secondary | ICD-10-CM | POA: Insufficient documentation

## 2020-10-23 DIAGNOSIS — I10 Essential (primary) hypertension: Secondary | ICD-10-CM | POA: Diagnosis not present

## 2020-10-23 MED ORDER — PHENTERMINE HCL 37.5 MG PO TABS: 37.5000 mg | ORAL_TABLET | Freq: Every day | ORAL | 1 refills | Status: DC

## 2020-10-23 MED ORDER — VALACYCLOVIR HCL 1 G PO TABS: 1000.0000 mg | ORAL_TABLET | Freq: Three times a day (TID) | ORAL | 0 refills | Status: DC

## 2020-10-23 NOTE — Progress Notes (Signed)
Subjective:  Patient ID: Desiree Holmes, female    DOB: [birth year]  Age: 59 y.o. MRN: [id number]  Chief Complaint  Patient presents with  . Hypertension    54M f/u    HPI Pt presents for follow up of hypertension.  The patient is tolerating the medication well without side effects. Compliance with treatment has been good; including taking medication as directed , maintains a healthy diet and regular exercise regimen , and following up as directed.currently on cozaar 100mg  qd  Pt would like to restart adipex for weight gain - has been on in past and tolerated well - did help her lose weight - will work on diet  Pt with history of GERD - currently on prilosec 40mg  qd  Pt with rash on face for one week Current Outpatient Medications on File Prior to Visit  Medication Sig Dispense Refill  . calcium carbonate (OS-CAL) 600 MG TABS Take 600 mg by mouth 2 (two) times daily with a meal.    . estradiol (ESTRACE) 1 MG tablet Take 1 tablet (1 mg total) by mouth daily. 90 tablet 4  . Loratadine-Pseudoephedrine (EQ ALLERGY RELIEF D 24 HOUR PO) Take by mouth.    . losartan (COZAAR) 100 MG tablet TAKE 1 TABLET(100 MG) BY MOUTH DAILY 30 tablet 1  . omeprazole (PRILOSEC) 40 MG capsule TK ONE C PO  D     No current facility-administered medications on file prior to visit.   Past Medical History:  Diagnosis Date  . Barrett's esophagus   . H. pylori infection   . History of colon polyps    Past Surgical History:  Procedure Laterality Date  . ABDOMINAL HYSTERECTOMY     with BSO  . CESAREAN SECTION     x2  . DIAGNOSTIC LAPAROSCOPY    . DILATION AND CURETTAGE OF UTERUS    . MINI LAP STERILIZATION    . TOTAL ABDOMINAL HYSTERECTOMY W/ BILATERAL SALPINGOOPHORECTOMY    . TUBOPLASTY / TUBOTUBAL ANASTOMOSIS      Family History  Problem Relation Age of Onset  . COPD Father   . Cancer Father        STOMACH  . Diabetes Mother   . Hypertension Mother   . Thyroid disease Mother   .  Lupus Sister   . Diabetes Maternal Grandmother   . Heart disease Maternal Grandfather   . Heart disease Paternal Grandfather   . Cancer Paternal Grandfather        COLON  . Breast cancer Neg Hx    Social History   Socioeconomic History  . Marital status: Married    Spouse name: Not on file  . Number of children: 1  . Years of education: Not on file  . Highest education level: Not on file  Occupational History  . 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  Tobacco Use  . Smoking status: Never Smoker  . Smokeless tobacco: Never Used  Vaping Use  . Vaping Use: Never used  Substance and Sexual Activity  . Alcohol use: No    Alcohol/week: 0.0 standard drinks  . Drug use: No  . Sexual activity: Not Currently    Birth control/protection: Surgical    Comment: 1st intercourse 30 yo-5 partners  Other Topics Concern  . Not on file  Social History Narrative  . Not on file   Social Determinants of Health   Financial Resource Strain:   . Difficulty of Paying Living Expenses: Not on file  Food Insecurity:   . Worried  About Running Out of Food in the Last Year: Not on file  . Ran Out of Food in the Last Year: Not on file  Transportation Needs:   . Lack of Transportation (Medical): Not on file  . Lack of Transportation (Non-Medical): Not on file  Physical Activity:   . Days of Exercise per Week: Not on file  . Minutes of Exercise per Session: Not on file  Stress:   . Feeling of Stress : Not on file  Social Connections:   . Frequency of Communication with Friends and Family: Not on file  . Frequency of Social Gatherings with Friends and Family: Not on file  . Attends Religious Services: Not on file  . Active Member of Clubs or Organizations: Not on file  . Attends Banker Meetings: Not on file  . Marital Status: Not on file    Review of Systems  CONSTITUTIONAL: Negative for chills, fatigue, fever, unintentional weight gain and unintentional weight loss.  E/N/T: Negative for ear  pain, nasal congestion and sore throat.  CARDIOVASCULAR: Negative for chest pain, dizziness, palpitations and pedal edema.  RESPIRATORY: Negative for recent cough and dyspnea.  GASTROINTESTINAL: Negative for abdominal pain, acid reflux symptoms, constipation, diarrhea, nausea and vomiting.   PSYCHIATRIC: Negative for sleep disturbance and to question depression screen.  Negative for depression, negative for anhedonia.      Objective:  BP 120/78 (BP Location: Right Arm, Patient Position: Sitting, Cuff Size: Normal)   Pulse 80   Temp (!) 97.5 F (36.4 C) (Temporal)   Ht 5' 2.5" (1.588 m)   Wt 209 lb 6.4 oz (95 kg)   SpO2 99%   BMI 37.69 kg/m   BP/Weight 10/23/2020 09/25/2020 08/29/2020  Systolic BP 120 124 -  Diastolic BP 78 80 -  Wt. (Lbs) 209.4 204 194.8  BMI 37.69 36.72 34.51    Physical Exam PHYSICAL EXAM:   VS: BP 120/78 (BP Location: Right Arm, Patient Position: Sitting, Cuff Size: Normal)   Pulse 80   Temp (!) 97.5 F (36.4 C) (Temporal)   Ht 5' 2.5" (1.588 m)   Wt 209 lb 6.4 oz (95 kg)   SpO2 99%   BMI 37.69 kg/m   GEN: Well nourished, well developed, in no acute distress  HEENT: normal external ears and nose - normal external auditory canals and TMS - hearing grossly normal - normal nasal mucosa and septum - Lips, Teeth and Gums - normal  Oropharynx - normal mucosa, palate, and posterior pharynx  Cardiac: RRR; no murmurs, rubs, or gallops,no edema - no significant varicosities Respiratory:  normal respiratory rate and pattern with no distress - normal breath sounds with no rales, rhonchi, wheezes or rubs  Psych: euthymic mood, appropriate affect and demeanor  Diabetic Foot Exam - Simple   No data filed       Lab Results  Component Value Date   WBC 9.6 05/24/2020   HGB 14.1 05/24/2020   HCT 43.1 05/24/2020   PLT 319 05/24/2020   GLUCOSE 80 05/24/2020   CHOL 169 05/24/2020   TRIG 113 05/24/2020   HDL 47 05/24/2020   LDLCALC 102 (H) 05/24/2020   ALT  18 05/24/2020   AST 19 05/24/2020   NA 140 05/24/2020   K 3.5 05/24/2020   CL 104 05/24/2020   CREATININE 0.68 05/24/2020   BUN 11 05/24/2020   CO2 25 05/24/2020   TSH 1.730 05/24/2020      Assessment & Plan:   1. Benign hypertension -  CBC with Differential/Platelet - Comprehensive metabolic panel - Lipid panel Continue current meds 2. Weight gain Watch diet/ exercise Restart adipex as directed 3. Gastroesophageal reflux disease without esophagitis Continue current meds 4. Herpes simplex infection  rx for valtrex Meds ordered this encounter  Medications  . valACYclovir (VALTREX) 1000 MG tablet    Sig: Take 1 tablet (1,000 mg total) by mouth 3 (three) times daily.    Dispense:  21 tablet    Refill:  0    Order Specific Question:   Supervising Provider    AnswerCorey Harold    Orders Placed This Encounter  Procedures  . CBC with Differential/Platelet  . Comprehensive metabolic panel  . Lipid panel        Follow-up: Return in about 2 months (around 12/24/2020) for follow up.  An After Visit Summary was printed and given to the patient.  Jettie Pagan [facility] [phone]

## 2020-10-24 LAB — CBC WITH DIFFERENTIAL/PLATELET
Basophils Absolute: 0 10*3/uL (ref 0.0–0.2)
Basos: 1 %
EOS (ABSOLUTE): 0.1 10*3/uL (ref 0.0–0.4)
Eos: 1 %
Hematocrit: 41.5 % (ref 34.0–46.6)
Hemoglobin: 14 g/dL (ref 11.1–15.9)
Immature Grans (Abs): 0 10*3/uL (ref 0.0–0.1)
Immature Granulocytes: 0 %
Lymphocytes Absolute: 2.6 10*3/uL (ref 0.7–3.1)
Lymphs: 32 %
MCH: 29.3 pg (ref 26.6–33.0)
MCHC: 33.7 g/dL (ref 31.5–35.7)
MCV: 87 fL (ref 79–97)
Monocytes Absolute: 0.5 10*3/uL (ref 0.1–0.9)
Monocytes: 6 %
Neutrophils Absolute: 4.9 10*3/uL (ref 1.4–7.0)
Neutrophils: 60 %
Platelets: 275 10*3/uL (ref 150–450)
RBC: 4.78 x10E6/uL (ref 3.77–5.28)
RDW: 12.9 % (ref 11.7–15.4)
WBC: 8.1 10*3/uL (ref 3.4–10.8)

## 2020-10-24 LAB — LIPID PANEL
Chol/HDL Ratio: 3.1 ratio (ref 0.0–4.4)
Cholesterol, Total: 165 mg/dL (ref 100–199)
HDL: 54 mg/dL (ref >39)
LDL Chol Calc (NIH): 96 mg/dL (ref 0–99)
Triglycerides: 81 mg/dL (ref 0–149)
VLDL Cholesterol Cal: 15 mg/dL (ref 5–40)

## 2020-10-24 LAB — COMPREHENSIVE METABOLIC PANEL
ALT: 15 IU/L (ref 0–32)
AST: 16 IU/L (ref 0–40)
Albumin/Globulin Ratio: 1.9 (ref 1.2–2.2)
Albumin: 4.1 g/dL (ref 3.8–4.8)
Alkaline Phosphatase: 79 IU/L (ref 44–121)
BUN/Creatinine Ratio: 15 (ref 12–28)
BUN: 10 mg/dL (ref 8–27)
Bilirubin Total: 0.6 mg/dL (ref 0.0–1.2)
CO2: 22 mmol/L (ref 20–29)
Calcium: 8.9 mg/dL (ref 8.7–10.3)
Chloride: 104 mmol/L (ref 96–106)
Creatinine, Ser: 0.68 mg/dL (ref 0.57–1.00)
GFR calc Af Amer: 109 mL/min/{1.73_m2} (ref >59)
GFR calc non Af Amer: 95 mL/min/{1.73_m2} (ref >59)
Globulin, Total: 2.2 g/dL (ref 1.5–4.5)
Glucose: 83 mg/dL (ref 65–99)
Potassium: 4 mmol/L (ref 3.5–5.2)
Sodium: 139 mmol/L (ref 134–144)
Total Protein: 6.3 g/dL (ref 6.0–8.5)

## 2020-10-24 LAB — CARDIOVASCULAR RISK ASSESSMENT

## 2020-11-06 ENCOUNTER — Other Ambulatory Visit: Payer: Self-pay | Admitting: Physician Assistant

## 2020-12-15 DIAGNOSIS — K5730 Diverticulosis of large intestine without perforation or abscess without bleeding: Secondary | ICD-10-CM | POA: Diagnosis not present

## 2020-12-15 DIAGNOSIS — Z1211 Encounter for screening for malignant neoplasm of colon: Secondary | ICD-10-CM | POA: Diagnosis not present

## 2020-12-15 DIAGNOSIS — Z800 Family history of malignant neoplasm of digestive organs: Secondary | ICD-10-CM | POA: Diagnosis not present

## 2020-12-15 DIAGNOSIS — K219 Gastro-esophageal reflux disease without esophagitis: Secondary | ICD-10-CM | POA: Diagnosis not present

## 2020-12-23 ENCOUNTER — Telehealth: Payer: Self-pay | Admitting: Gastroenterology

## 2020-12-23 NOTE — Telephone Encounter (Signed)
Patient reports that she had an EGD and colonoscopy with Dr. Loreta Ave on Friday, 1/28.  Calls today saying that she had been doing well, but at 1:00 this morning she woke up with a pressure sensation in her abdomen, discomfort like she had to have a bowel movement.  She does take stool softeners and admits that maybe she is not moving her bowels quite as regularly as she should be.  Discomfort is not severe.  She says she is fine when she is up walking around, but had a difficult time sleeping the rest of the night last night.  No other associated symptoms.  Advised to monitor and possibly begin some MiraLAX if she is not moving her bowels well.  Can use Gas-X, Pepto-Bismol, other over-the-counter remedies if needed.  Will call back for any worsening symptoms.  Advised that it would be very unusual to be something procedure related that would have developed over a week later.

## 2020-12-28 ENCOUNTER — Ambulatory Visit: Payer: BC Managed Care – PPO | Admitting: Physician Assistant

## 2021-01-02 ENCOUNTER — Ambulatory Visit: Payer: Self-pay | Admitting: Physician Assistant

## 2021-01-03 ENCOUNTER — Other Ambulatory Visit: Payer: Self-pay

## 2021-01-03 ENCOUNTER — Encounter: Payer: Self-pay | Admitting: Physician Assistant

## 2021-01-03 ENCOUNTER — Ambulatory Visit: Payer: BC Managed Care – PPO | Admitting: Physician Assistant

## 2021-01-03 VITALS — BP 130/78 | HR 104 | Temp 97.6°F | Ht 64.0 in | Wt 205.4 lb

## 2021-01-03 DIAGNOSIS — I10 Essential (primary) hypertension: Secondary | ICD-10-CM | POA: Diagnosis not present

## 2021-01-03 DIAGNOSIS — Z6837 Body mass index (BMI) 37.0-37.9, adult: Secondary | ICD-10-CM | POA: Diagnosis not present

## 2021-01-03 MED ORDER — PHENTERMINE HCL 37.5 MG PO TABS: 37.5000 mg | ORAL_TABLET | Freq: Every day | ORAL | 1 refills | Status: DC

## 2021-01-03 NOTE — Progress Notes (Signed)
Subjective:  Patient ID: Desiree Holmes, female    DOB: 1961-10-31  Age: 60 y.o. MRN: [id number]  Chief Complaint  Patient presents with  . Hypertension    89M     HPI Pt presents for follow up of hypertension.  The patient is tolerating the medication well without side effects. Compliance with treatment has been good; including taking medication as directed , maintains a healthy diet and regular exercise regimen , and following up as directed.currently on cozaar 100mg  qd  Pt here for follow up of adipex - only started back on med 2 weeks ago - had gotten up to 211lbs and now at 205 - voices no problems or concerns Current Outpatient Medications on File Prior to Visit  Medication Sig Dispense Refill  . calcium carbonate (OS-CAL) 600 MG TABS Take 600 mg by mouth 2 (two) times daily with a meal.    . estradiol (ESTRACE) 1 MG tablet Take 1 tablet (1 mg total) by mouth daily. 90 tablet 4  . Loratadine-Pseudoephedrine (EQ ALLERGY RELIEF D 24 HOUR PO) Take by mouth.    . losartan (COZAAR) 100 MG tablet TAKE 1 TABLET(100 MG) BY MOUTH DAILY 30 tablet 1  . omeprazole (PRILOSEC) 40 MG capsule TK ONE C PO  D    . phentermine (ADIPEX-P) 37.5 MG tablet Take 1 tablet (37.5 mg total) by mouth daily before breakfast. 30 tablet 1   No current facility-administered medications on file prior to visit.   Past Medical History:  Diagnosis Date  . Barrett's esophagus   . H. pylori infection   . History of colon polyps    Past Surgical History:  Procedure Laterality Date  . ABDOMINAL HYSTERECTOMY     with BSO  . CESAREAN SECTION     x2  . DIAGNOSTIC LAPAROSCOPY    . DILATION AND CURETTAGE OF UTERUS    . MINI LAP STERILIZATION    . TOTAL ABDOMINAL HYSTERECTOMY W/ BILATERAL SALPINGOOPHORECTOMY    . TUBOPLASTY / TUBOTUBAL ANASTOMOSIS      Family History  Problem Relation Age of Onset  . COPD Father   . Cancer Father        STOMACH  . Diabetes Mother   . Hypertension Mother   .  Thyroid disease Mother   . Lupus Sister   . Diabetes Maternal Grandmother   . Heart disease Maternal Grandfather   . Heart disease Paternal Grandfather   . Cancer Paternal Grandfather        COLON  . Breast cancer Neg Hx    Social History   Socioeconomic History  . Marital status: Married    Spouse name: Not on file  . Number of children: 1  . Years of education: Not on file  . Highest education level: Not on file  Occupational History  . 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  Tobacco Use  . Smoking status: Never Smoker  . Smokeless tobacco: Never Used  Vaping Use  . Vaping Use: Never used  Substance and Sexual Activity  . Alcohol use: No    Alcohol/week: 0.0 standard drinks  . Drug use: No  . Sexual activity: Not Currently    Birth control/protection: Surgical    Comment: 1st intercourse 42 yo-5 partners  Other Topics Concern  . Not on file  Social History Narrative  . Not on file   Social Determinants of Health   Financial Resource Strain: Not on file  Food Insecurity: Not on file  Transportation Needs: Not on file  Physical Activity: Not on file  Stress: Not on file  Social Connections: Not on file    Review of Systems CONSTITUTIONAL: Negative for chills, fatigue, fever, unintentional weight gain and unintentional weight loss.  CARDIOVASCULAR: Negative for chest pain, dizziness, palpitations and pedal edema.  RESPIRATORY: Negative for recent cough and dyspnea.  PSYCHIATRIC: Negative for sleep disturbance and to question depression screen.  Negative for depression, negative for anhedonia.          Objective:  BP 130/78 (BP Location: Right Arm, Patient Position: Sitting, Cuff Size: Large)   Pulse (!) 104   Temp 97.6 F (36.4 C) (Temporal)   Ht 5\' 4"  (1.626 m)   Wt 205 lb 6.4 oz (93.2 kg)   SpO2 98%   BMI 35.26 kg/m   BP/Weight 01/03/2021 10/23/2020 09/25/2020  Systolic BP 130 120 124  Diastolic BP 78 78 80  Wt. (Lbs) 205.4 209.4 204  BMI 35.26 37.69 36.72     Physical Exam PHYSICAL EXAM:   VS: BP 130/78 (BP Location: Right Arm, Patient Position: Sitting, Cuff Size: Large)   Pulse (!) 104   Temp 97.6 F (36.4 C) (Temporal)   Ht 5\' 4"  (1.626 m)   Wt 205 lb 6.4 oz (93.2 kg)   SpO2 98%   BMI 35.26 kg/m   PHYSICAL EXAM:   VS: BP 130/78 (BP Location: Right Arm, Patient Position: Sitting, Cuff Size: Large)   Pulse (!) 104   Temp 97.6 F (36.4 C) (Temporal)   Ht 5\' 4"  (1.626 m)   Wt 205 lb 6.4 oz (93.2 kg)   SpO2 98%   BMI 35.26 kg/m   GEN: Well nourished, well developed, in no acute distress  Cardiac: RRR; no murmurs, rubs, or gallops,no edema -  Respiratory:  normal respiratory rate and pattern with no distress - normal breath sounds with no rales, rhonchi, wheezes or rubs Psych: euthymic mood, appropriate affect and demeanor   Diabetic Foot Exam - Simple   No data filed      Lab Results  Component Value Date   WBC 8.1 10/23/2020   HGB 14.0 10/23/2020   HCT 41.5 10/23/2020   PLT 275 10/23/2020   GLUCOSE 83 10/23/2020   CHOL 165 10/23/2020   TRIG 81 10/23/2020   HDL 54 10/23/2020   LDLCALC 96 10/23/2020   ALT 15 10/23/2020   AST 16 10/23/2020   NA 139 10/23/2020   K 4.0 10/23/2020   CL 104 10/23/2020   CREATININE 0.68 10/23/2020   BUN 10 10/23/2020   CO2 22 10/23/2020   TSH 1.730 05/24/2020      Assessment & Plan:  1 - hypertension - continue meds as directed 2  Elevated BMI -- continue to watch diet and continue adipex as directed  No orders of the defined types were placed in this encounter.   No orders of the defined types were placed in this encounter.       Follow-up: Return in about 2 months (around 03/03/2021) for follow up.  An After Visit Summary was printed and given to the patient.  14/04/2020 [facility] [phone]

## 2021-01-08 ENCOUNTER — Other Ambulatory Visit: Payer: Self-pay | Admitting: Physician Assistant

## 2021-01-08 DIAGNOSIS — Z6837 Body mass index (BMI) 37.0-37.9, adult: Secondary | ICD-10-CM

## 2021-01-08 MED ORDER — PHENTERMINE HCL 37.5 MG PO TABS: 37.5000 mg | ORAL_TABLET | Freq: Every day | ORAL | 1 refills | Status: DC

## 2021-01-17 DIAGNOSIS — L570 Actinic keratosis: Secondary | ICD-10-CM | POA: Diagnosis not present

## 2021-01-29 ENCOUNTER — Telehealth (INDEPENDENT_AMBULATORY_CARE_PROVIDER_SITE_OTHER): Payer: BC Managed Care – PPO | Admitting: Physician Assistant

## 2021-01-29 ENCOUNTER — Encounter: Payer: Self-pay | Admitting: Physician Assistant

## 2021-01-29 VITALS — Temp 97.9°F | Ht 64.0 in | Wt 202.4 lb

## 2021-01-29 DIAGNOSIS — K5730 Diverticulosis of large intestine without perforation or abscess without bleeding: Secondary | ICD-10-CM | POA: Insufficient documentation

## 2021-01-29 DIAGNOSIS — J0110 Acute frontal sinusitis, unspecified: Secondary | ICD-10-CM

## 2021-01-29 DIAGNOSIS — Z860100 Personal history of colon polyps, unspecified: Secondary | ICD-10-CM | POA: Insufficient documentation

## 2021-01-29 DIAGNOSIS — K5904 Chronic idiopathic constipation: Secondary | ICD-10-CM | POA: Insufficient documentation

## 2021-01-29 DIAGNOSIS — Z1211 Encounter for screening for malignant neoplasm of colon: Secondary | ICD-10-CM | POA: Insufficient documentation

## 2021-01-29 DIAGNOSIS — Z800 Family history of malignant neoplasm of digestive organs: Secondary | ICD-10-CM | POA: Insufficient documentation

## 2021-01-29 MED ORDER — AMOXICILLIN-POT CLAVULANATE 875-125 MG PO TABS: 1.0000 | ORAL_TABLET | Freq: Two times a day (BID) | ORAL | 0 refills | Status: DC

## 2021-01-29 NOTE — Progress Notes (Signed)
Virtual Visit via Telephone Note   This visit type was conducted due to national recommendations for restrictions regarding the COVID-19 Pandemic (e.g. social distancing) in an effort to limit this patient's exposure and mitigate transmission in our community.  Due to her co-morbid illnesses, this patient is at least at moderate risk for complications without adequate follow up.  This format is felt to be most appropriate for this patient at this time.  The patient did not have access to video technology/had technical difficulties with video requiring transitioning to audio format only (telephone).  All issues noted in this document were discussed and addressed.  No physical exam could be performed with this format.  Patient verbally consented to a telehealth visit.   Date:  01/29/2021   ID:  Desiree Holmes, DOB 07/25/1961, MRN [id number]  Patient Location: Home Provider Location: Office  PCP:  Desiree Sofia, PA-C     Chief Complaint:  sinusitis  History of Present Illness:    Desiree Holmes is a 60 y.o. female with sinus congestion, nasal drainage, pressure above both eyes - denies fever or bodyaches Did take home covid test yesterday which was negative Is taking zyrtec  The patient does not have symptoms concerning for COVID-19 infection (fever, chills, cough, or new shortness of breath).    Past Medical History:  Diagnosis Date  . Barrett's esophagus   . H. pylori infection   . History of colon polyps    Past Surgical History:  Procedure Laterality Date  . ABDOMINAL HYSTERECTOMY     with BSO  . CESAREAN SECTION     x2  . DIAGNOSTIC LAPAROSCOPY    . DILATION AND CURETTAGE OF UTERUS    . MINI LAP STERILIZATION    . TOTAL ABDOMINAL HYSTERECTOMY W/ BILATERAL SALPINGOOPHORECTOMY    . TUBOPLASTY / TUBOTUBAL ANASTOMOSIS       Current Meds  Medication Sig  . amoxicillin-clavulanate (AUGMENTIN) 875-125 MG tablet Take 1 tablet by mouth 2 (two) times daily.   . calcium carbonate (OS-CAL) 600 MG TABS Take 600 mg by mouth 2 (two) times daily with a meal.  . estradiol (ESTRACE) 1 MG tablet Take 1 tablet (1 mg total) by mouth daily.  . Loratadine-Pseudoephedrine (EQ ALLERGY RELIEF D 24 HOUR PO) Take by mouth.  . losartan (COZAAR) 100 MG tablet TAKE 1 TABLET(100 MG) BY MOUTH DAILY  . omeprazole (PRILOSEC) 40 MG capsule TK ONE C PO  D  . phentermine (ADIPEX-P) 37.5 MG tablet Take 1 tablet (37.5 mg total) by mouth daily before breakfast.  . [DISCONTINUED] omeprazole (PRILOSEC) 40 MG capsule 1 cap(s)     Allergies:   Iodinated diagnostic agents and Sulfa antibiotics   Social History   Tobacco Use  . Smoking status: Never Smoker  . Smokeless tobacco: Never Used  Vaping Use  . Vaping Use: Never used  Substance Use Topics  . Alcohol use: No    Alcohol/week: 0.0 standard drinks  . Drug use: No     Family Hx: The patient's family history includes COPD in her father; Cancer in her father and paternal grandfather; Diabetes in her maternal grandmother and mother; Heart disease in her maternal grandfather and paternal grandfather; Hypertension in her mother; Lupus in her sister; Thyroid disease in her mother. There is no history of Breast cancer.  ROS:   Please see the history of present illness.    All other systems reviewed and are negative.  Labs/Other Tests and Data Reviewed:    Recent  Labs: 05/24/2020: TSH 1.730 10/23/2020: ALT 15; BUN 10; Creatinine, Ser 0.68; Hemoglobin 14.0; Platelets 275; Potassium 4.0; Sodium 139   Recent Lipid Panel Lab Results  Component Value Date/Time   CHOL 165 10/23/2020 09:34 AM   TRIG 81 10/23/2020 09:34 AM   HDL 54 10/23/2020 09:34 AM   CHOLHDL 3.1 10/23/2020 09:34 AM   LDLCALC 96 10/23/2020 09:34 AM    Wt Readings from Last 3 Encounters:  01/29/21 202 lb 6.4 oz (91.8 kg)  01/03/21 205 lb 6.4 oz (93.2 kg)  10/23/20 209 lb 6.4 oz (95 kg)     Objective:    Vital Signs:  Temp 97.9 F (36.6 C)   Ht 5'  4" (1.626 m)   Wt 202 lb 6.4 oz (91.8 kg)   BMI 34.74 kg/m    VITAL SIGNS:  reviewed  ASSESSMENT & PLAN:    1. sinusitis  COVID-19 Education: The signs and symptoms of COVID-19 were discussed with the patient and how to seek care for testing (follow up with PCP or arrange E-visit). The importance of social distancing was discussed today.  Time:   Today, I have spent 10 minutes with the patient with telehealth technology discussing the above problems.     Medication Adjustments/Labs and Tests Ordered: Current medicines are reviewed at length with the patient today.  Concerns regarding medicines are outlined above.   Tests Ordered: No orders of the defined types were placed in this encounter.   Medication Changes: Meds ordered this encounter  Medications  . amoxicillin-clavulanate (AUGMENTIN) 875-125 MG tablet    Sig: Take 1 tablet by mouth 2 (two) times daily.    Dispense:  20 tablet    Refill:  0    Order Specific Question:   Supervising Provider    AnswerCorey Harold    Follow Up:  In Person prn  Signed, Jettie Pagan  01/29/2021 8:38 AM    [facility]

## 2021-03-06 ENCOUNTER — Ambulatory Visit: Payer: BC Managed Care – PPO | Admitting: Physician Assistant

## 2021-03-07 ENCOUNTER — Telehealth (INDEPENDENT_AMBULATORY_CARE_PROVIDER_SITE_OTHER): Payer: BC Managed Care – PPO | Admitting: Physician Assistant

## 2021-03-07 ENCOUNTER — Encounter: Payer: Self-pay | Admitting: Physician Assistant

## 2021-03-07 ENCOUNTER — Telehealth: Payer: Self-pay

## 2021-03-07 VITALS — Temp 96.9°F | Ht 64.0 in | Wt 204.0 lb

## 2021-03-07 DIAGNOSIS — K529 Noninfective gastroenteritis and colitis, unspecified: Secondary | ICD-10-CM | POA: Diagnosis not present

## 2021-03-07 NOTE — Telephone Encounter (Signed)
Pt had televisit today

## 2021-03-07 NOTE — Progress Notes (Signed)
Virtual Visit via Telephone Note   This visit type was conducted due to national recommendations for restrictions regarding the COVID-19 Pandemic (e.g. social distancing) in an effort to limit this patient's exposure and mitigate transmission in our community.  Due to her co-morbid illnesses, this patient is at least at moderate risk for complications without adequate follow up.  This format is felt to be most appropriate for this patient at this time.  The patient did not have access to video technology/had technical difficulties with video requiring transitioning to audio format only (telephone).  All issues noted in this document were discussed and addressed.  No physical exam could be performed with this format.  Patient verbally consented to a telehealth visit.   Date:  03/07/2021   ID:  Desiree Holmes, DOB [birth year], MRN [id number]  Patient Location: Home Provider Location: Office  PCP:  Marianne Sofia, PA-C    Chief Complaint:  diarrhea  History of Present Illness:    Desiree Holmes is a 60 y.o. female with diarrhea - she states her father in law has same symptoms - she states on Monday she had diarrhea and then one episode of vomiting Yesterday she just had diarrhea Today she is feeling better just slightly weak - denies fever, abdominal pain Appetite is decreased  The patient does not have symptoms concerning for COVID-19 infection (fever, chills, cough, or new shortness of breath).    Past Medical History:  Diagnosis Date  . Barrett's esophagus   . H. pylori infection   . History of colon polyps    Past Surgical History:  Procedure Laterality Date  . ABDOMINAL HYSTERECTOMY     with BSO  . CESAREAN SECTION     x2  . DIAGNOSTIC LAPAROSCOPY    . DILATION AND CURETTAGE OF UTERUS    . MINI LAP STERILIZATION    . TOTAL ABDOMINAL HYSTERECTOMY W/ BILATERAL SALPINGOOPHORECTOMY    . TUBOPLASTY / TUBOTUBAL ANASTOMOSIS       No outpatient medications  have been marked as taking for the 03/07/21 encounter (Video Visit) with Marianne Sofia, PA-C.     Allergies:   Iodinated diagnostic agents and Sulfa antibiotics   Social History   Tobacco Use  . Smoking status: Never Smoker  . Smokeless tobacco: Never Used  Vaping Use  . Vaping Use: Never used  Substance Use Topics  . Alcohol use: No    Alcohol/week: 0.0 standard drinks  . Drug use: No     Family Hx: The patient's family history includes COPD in her father; Cancer in her father and paternal grandfather; Diabetes in her maternal grandmother and mother; Heart disease in her maternal grandfather and paternal grandfather; Hypertension in her mother; Lupus in her sister; Thyroid disease in her mother. There is no history of Breast cancer.  ROS:   Please see the history of present illness.    All other systems reviewed and are negative.  Labs/Other Tests and Data Reviewed:    Recent Labs: 05/24/2020: TSH 1.730 10/23/2020: ALT 15; BUN 10; Creatinine, Ser 0.68; Hemoglobin 14.0; Platelets 275; Potassium 4.0; Sodium 139   Recent Lipid Panel Lab Results  Component Value Date/Time   CHOL 165 10/23/2020 09:34 AM   TRIG 81 10/23/2020 09:34 AM   HDL 54 10/23/2020 09:34 AM   CHOLHDL 3.1 10/23/2020 09:34 AM   LDLCALC 96 10/23/2020 09:34 AM    Wt Readings from Last 3 Encounters:  03/07/21 204 lb (92.5 kg)  01/29/21 202 lb 6.4 oz (  91.8 kg)  01/03/21 205 lb 6.4 oz (93.2 kg)     Objective:    Vital Signs:  Temp (!) 96.9 F (36.1 C)   Ht 5\' 4"  (1.626 m)   Wt 204 lb (92.5 kg)   BMI 35.02 kg/m    VITAL SIGNS:  reviewed  ASSESSMENT & PLAN:    1. Gastroenteritis - recommend clear liquids and bland diet for 24-48 hours -  Follow up if any symptoms change or worsen  COVID-19 Education: The signs and symptoms of COVID-19 were discussed with the patient and how to seek care for testing (follow up with PCP or arrange E-visit). The importance of social distancing was discussed  today.  Time:   Today, I have spent 10 minutes with the patient with telehealth technology discussing the above problems.     Medication Adjustments/Labs and Tests Ordered: Current medicines are reviewed at length with the patient today.  Concerns regarding medicines are outlined above.   Tests Ordered: No orders of the defined types were placed in this encounter.   Medication Changes: No orders of the defined types were placed in this encounter.   Follow Up:  In Person prn  Signed,  03/07/2021 8:47 AM    [facility]

## 2021-03-07 NOTE — Telephone Encounter (Signed)
Pt called stating on Monday she was vomiting w/ diarrhea. Yesterday it was some better and could eat light foods. Today she is tired and when she eats she then has diarrhea. She denies n/v, body aches, congestion, cough.   Terrill Mohr 03/07/21 8:19 AM

## 2021-03-12 ENCOUNTER — Other Ambulatory Visit: Payer: Self-pay

## 2021-03-12 ENCOUNTER — Ambulatory Visit (INDEPENDENT_AMBULATORY_CARE_PROVIDER_SITE_OTHER): Payer: BC Managed Care – PPO | Admitting: Physician Assistant

## 2021-03-12 ENCOUNTER — Encounter: Payer: Self-pay | Admitting: Physician Assistant

## 2021-03-12 VITALS — BP 132/82 | HR 96 | Temp 96.2°F | Ht 64.0 in | Wt 202.2 lb

## 2021-03-12 DIAGNOSIS — I10 Essential (primary) hypertension: Secondary | ICD-10-CM | POA: Diagnosis not present

## 2021-03-12 DIAGNOSIS — Z6837 Body mass index (BMI) 37.0-37.9, adult: Secondary | ICD-10-CM | POA: Diagnosis not present

## 2021-03-12 MED ORDER — PHENTERMINE HCL 37.5 MG PO TABS: 37.5000 mg | ORAL_TABLET | Freq: Every day | ORAL | 1 refills | Status: DC

## 2021-03-12 NOTE — Progress Notes (Signed)
Subjective:  Patient ID: Desiree Holmes, female    DOB: [birth year]  Age: 60 y.o. MRN: [id number]  Chief Complaint  Patient presents with  . Hypertension    55M Follow up    Hypertension   Pt presents for follow up of hypertension.  The patient is tolerating the medication well without side effects. Compliance with treatment has been good; including taking medication as directed , maintains a healthy diet and regular exercise regimen , and following up as directed.currently on cozaar 100mg  qd  Pt here for follow up of adipex - is doing well on medication - has not been taking every day but has continued to lose weight - would like to continue medication Current Outpatient Medications on File Prior to Visit  Medication Sig Dispense Refill  . calcium carbonate (OS-CAL) 600 MG TABS Take 600 mg by mouth 2 (two) times daily with a meal.    . estradiol (ESTRACE) 1 MG tablet Take 1 tablet (1 mg total) by mouth daily. 90 tablet 4  . Loratadine-Pseudoephedrine (EQ ALLERGY RELIEF D 24 HOUR PO) Take by mouth.    . losartan (COZAAR) 100 MG tablet TAKE 1 TABLET(100 MG) BY MOUTH DAILY 30 tablet 1  . omeprazole (PRILOSEC) 40 MG capsule TK ONE C PO  D    . vitamin C (ASCORBIC ACID) 500 MG tablet Take 500 mg by mouth daily.     No current facility-administered medications on file prior to visit.   Past Medical History:  Diagnosis Date  . Barrett's esophagus   . H. pylori infection   . History of colon polyps    Past Surgical History:  Procedure Laterality Date  . ABDOMINAL HYSTERECTOMY     with BSO  . CESAREAN SECTION     x2  . DIAGNOSTIC LAPAROSCOPY    . DILATION AND CURETTAGE OF UTERUS    . MINI LAP STERILIZATION    . TOTAL ABDOMINAL HYSTERECTOMY W/ BILATERAL SALPINGOOPHORECTOMY    . TUBOPLASTY / TUBOTUBAL ANASTOMOSIS      Family History  Problem Relation Age of Onset  . COPD Father   . Cancer Father        STOMACH  . Diabetes Mother   . Hypertension Mother   .  Thyroid disease Mother   . Lupus Sister   . Diabetes Maternal Grandmother   . Heart disease Maternal Grandfather   . Heart disease Paternal Grandfather   . Cancer Paternal Grandfather        COLON  . Breast cancer Neg Hx    Social History   Socioeconomic History  . Marital status: Married    Spouse name: Not on file  . Number of children: 1  . Years of education: Not on file  . Highest education level: Not on file  Occupational History  . 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  Tobacco Use  . Smoking status: Never Smoker  . Smokeless tobacco: Never Used  Vaping Use  . Vaping Use: Never used  Substance and Sexual Activity  . Alcohol use: No    Alcohol/week: 0.0 standard drinks  . Drug use: No  . Sexual activity: Not Currently    Birth control/protection: Surgical    Comment: 1st intercourse 25 yo-5 partners  Other Topics Concern  . Not on file  Social History Narrative  . Not on file   Social Determinants of Health   Financial Resource Strain: Not on file  Food Insecurity: Not on file  Transportation Needs: Not on file  Physical Activity: Not on file  Stress: Not on file  Social Connections: Not on file    Review of Systems CONSTITUTIONAL: Negative for chills, fatigue, fever, unintentional weight gain and unintentional weight loss.  CARDIOVASCULAR: Negative for chest pain, dizziness, palpitations and pedal edema.  RESPIRATORY: Negative for recent cough and dyspnea.  PSYCHIATRIC: Negative for sleep disturbance and to question depression screen.  Negative for depression, negative for anhedonia.          Objective:  BP 132/82 (BP Location: Left Arm, Patient Position: Sitting, Cuff Size: Normal)   Pulse 96   Temp (!) 96.2 F (35.7 C) (Temporal)   Ht 5\' 4"  (1.626 m)   Wt 202 lb 3.2 oz (91.7 kg)   SpO2 98%   BMI 34.71 kg/m   BP/Weight 03/12/2021 03/07/2021 01/29/2021  Systolic BP 132 - -  Diastolic BP 82 - -  Wt. (Lbs) 202.2 204 202.4  BMI 34.71 35.02 34.74     Physical Exam PHYSICAL EXAM:  PHYSICAL EXAM:   VS: BP 132/82 (BP Location: Left Arm, Patient Position: Sitting, Cuff Size: Normal)   Pulse 96   Temp (!) 96.2 F (35.7 C) (Temporal)   Ht 5\' 4"  (1.626 m)   Wt 202 lb 3.2 oz (91.7 kg)   SpO2 98%   BMI 34.71 kg/m   GEN: Well nourished, well developed, in no acute distress  Cardiac: RRR; no murmurs, rubs, or gallops,no edema - Respiratory:  normal respiratory rate and pattern with no distress - normal breath sounds with no rales, rhonchi, wheezes or rubs Psych: euthymic mood, appropriate affect and demeanor  VS: BP 132/82 (BP Location: Left Arm, Patient Position: Sitting, Cuff Size: Normal)   Pulse 96   Temp (!) 96.2 F (35.7 C) (Temporal)   Ht 5\' 4"  (1.626 m)   Wt 202 lb 3.2 oz (91.7 kg)   SpO2 98%   BMI 34.71 kg/m   PHYSICAL EXAM:   VS: BP 132/82 (BP Location: Left Arm, Patient Position: Sitting, Cuff Size: Normal)   Pulse 96   Temp (!) 96.2 F (35.7 C) (Temporal)   Ht 5\' 4"  (1.626 m)   Wt 202 lb 3.2 oz (91.7 kg)   SpO2 98%   BMI 34.71 kg/m   GEN: Well nourished, well developed, in no acute distress  Cardiac: RRR; no murmurs, rubs, or gallops,no edema -  Respiratory:  normal respiratory rate and pattern with no distress - normal breath sounds with no rales, rhonchi, wheezes or rubs Psych: euthymic mood, appropriate affect and demeanor   Diabetic Foot Exam - Simple   No data filed      Lab Results  Component Value Date   WBC 8.1 10/23/2020   HGB 14.0 10/23/2020   HCT 41.5 10/23/2020   PLT 275 10/23/2020   GLUCOSE 83 10/23/2020   CHOL 165 10/23/2020   TRIG 81 10/23/2020   HDL 54 10/23/2020   LDLCALC 96 10/23/2020   ALT 15 10/23/2020   AST 16 10/23/2020   NA 139 10/23/2020   K 4.0 10/23/2020   CL 104 10/23/2020   CREATININE 0.68 10/23/2020   BUN 10 10/23/2020   CO2 22 10/23/2020   TSH 1.730 05/24/2020      Assessment & Plan:  1 - hypertension - continue meds as directed 2  Elevated BMI --  continue to watch diet and continue adipex as directed  Meds ordered this encounter  Medications  . phentermine (ADIPEX-P) 37.5 MG tablet    Sig: Take 1 tablet (  37.5 mg total) by mouth daily before breakfast.    Dispense:  30 tablet    Refill:  1    Order Specific Question:   Supervising Provider    Answer:   Corey Harold    No orders of the defined types were placed in this encounter.       Follow-up: Return in about 2 months (around 05/12/2021) for fasting follow up.  An After Visit Summary was printed and given to the patient.  Jettie Pagan [facility] [phone]

## 2021-03-13 ENCOUNTER — Other Ambulatory Visit: Payer: Self-pay | Admitting: Physician Assistant

## 2021-03-19 ENCOUNTER — Ambulatory Visit: Payer: BC Managed Care – PPO | Admitting: Podiatry

## 2021-04-04 ENCOUNTER — Other Ambulatory Visit: Payer: Self-pay | Admitting: Physician Assistant

## 2021-05-08 ENCOUNTER — Encounter: Payer: Self-pay | Admitting: Physician Assistant

## 2021-05-08 ENCOUNTER — Other Ambulatory Visit: Payer: Self-pay

## 2021-05-08 ENCOUNTER — Ambulatory Visit (INDEPENDENT_AMBULATORY_CARE_PROVIDER_SITE_OTHER): Payer: BC Managed Care – PPO | Admitting: Physician Assistant

## 2021-05-08 VITALS — BP 130/80 | HR 79 | Temp 97.4°F | Ht 64.0 in | Wt 203.4 lb

## 2021-05-08 DIAGNOSIS — Z0000 Encounter for general adult medical examination without abnormal findings: Secondary | ICD-10-CM | POA: Insufficient documentation

## 2021-05-08 DIAGNOSIS — Z6834 Body mass index (BMI) 34.0-34.9, adult: Secondary | ICD-10-CM | POA: Diagnosis not present

## 2021-05-08 DIAGNOSIS — Z23 Encounter for immunization: Secondary | ICD-10-CM | POA: Diagnosis not present

## 2021-05-08 MED ORDER — ESTRADIOL 1 MG PO TABS: 1.0000 mg | ORAL_TABLET | Freq: Every day | ORAL | 1 refills | Status: DC

## 2021-05-08 MED ORDER — PHENTERMINE HCL 37.5 MG PO TABS: 37.5000 mg | ORAL_TABLET | Freq: Every day | ORAL | 1 refills | Status: DC

## 2021-05-08 MED ORDER — LOSARTAN POTASSIUM 100 MG PO TABS: ORAL_TABLET | ORAL | 1 refills | Status: DC

## 2021-05-08 MED ORDER — OMEPRAZOLE 40 MG PO CPDR: DELAYED_RELEASE_CAPSULE | ORAL | 1 refills | Status: DC

## 2021-05-08 NOTE — Progress Notes (Signed)
Subjective:  Patient ID: Desiree Holmes, female    DOB: [birth year]  Age: 60 y.o. MRN: [id number]  Chief Complaint  Patient presents with   Annual Exam    HPI Well Adult Physical: Patient here for a comprehensive physical exam.The patient reports no problems Do you take any herbs or supplements that were not prescribed by a doctor? no Are you taking calcium supplements? no Are you taking aspirin daily? no  Encounter for general adult medical examination without abnormal findings  Physical ("At Risk" items are starred): Patient's last physical exam was 1 year ago .  Smoking: Life-long non-smoker ;  Physical Activity: does not exercise Alcohol/Drug Use: Is a non-drinker ; No illicit drug use ;  Patient is not afflicted from Stress Incontinence and Urge Incontinence  Safety: reviewed. Patient wears a seat belt, has smoke detectors, has carbon monoxide detectors, practices appropriate gun safety, and wears sunscreen with extended sun exposure. Dental Care: biannual cleanings, brushes and flosses daily. Ophthalmology/Optometry: Annual visit.  Hearing loss: none Vision impairments: none    Garment/textile technologist Visit from 10/23/2020 in [facility]  PHQ-2 Total Score 0               Social Hx   Social History   Socioeconomic History   Marital status: Married    Spouse name: Not on file   Number of children: 1   Years of education: Not on file   Highest education level: Not on file  Occupational History   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  Tobacco Use   Smoking status: Never   Smokeless tobacco: Never  Vaping Use   Vaping Use: Never used  Substance and Sexual Activity   Alcohol use: No    Alcohol/week: 0.0 standard drinks   Drug use: No   Sexual activity: Not Currently    Birth control/protection: Surgical    Comment: 1st intercourse 36 yo-5 partners  Other Topics Concern   Not on file  Social History Narrative   Not on file   Social Determinants of  Health   Financial Resource Strain: Not on file  Food Insecurity: Not on file  Transportation Needs: Not on file  Physical Activity: Not on file  Stress: Not on file  Social Connections: Not on file   Past Medical History:  Diagnosis Date   Barrett's esophagus    H. pylori infection    History of colon polyps    Past Surgical History:  Procedure Laterality Date   ABDOMINAL HYSTERECTOMY     with BSO   CESAREAN SECTION     x2   DIAGNOSTIC LAPAROSCOPY     DILATION AND CURETTAGE OF UTERUS     MINI LAP STERILIZATION     TOTAL ABDOMINAL HYSTERECTOMY W/ BILATERAL SALPINGOOPHORECTOMY     TUBOPLASTY / TUBOTUBAL ANASTOMOSIS      Family History  Problem Relation Age of Onset   COPD Father    Cancer Father        STOMACH   Diabetes Mother    Hypertension Mother    Thyroid disease Mother    Lupus Sister    Diabetes Maternal Grandmother    Heart disease Maternal Grandfather    Heart disease Paternal Grandfather    Cancer Paternal Grandfather        COLON   Breast cancer Neg Hx     Review of Systems CONSTITUTIONAL: Negative for chills, fatigue, fever, unintentional weight gain and unintentional weight loss.  E/N/T: Negative for ear pain, nasal  congestion and sore throat.  CARDIOVASCULAR: Negative for chest pain, dizziness, palpitations and pedal edema.  RESPIRATORY: Negative for recent cough and dyspnea.  GASTROINTESTINAL: Negative for abdominal pain, acid reflux symptoms, constipation, diarrhea, nausea and vomiting.  MSK: Negative for arthralgias and myalgias.  INTEGUMENTARY: Negative for rash.  NEUROLOGICAL: Negative for dizziness and headaches.  PSYCHIATRIC: Negative for sleep disturbance and to question depression screen.  Negative for depression, negative for anhedonia.       Objective:  BP 130/80 (BP Location: Left Arm, Patient Position: Sitting, Cuff Size: Normal)   Pulse 79   Temp (!) 97.4 F (36.3 C) (Temporal)   Ht 5\' 4"  (1.626 m)   Wt 203 lb 6.4 oz (92.3 kg)    SpO2 99%   BMI 34.91 kg/m   BP/Weight 05/08/2021 03/12/2021 03/07/2021  Systolic BP 130 132 -  Diastolic BP 80 82 -  Wt. (Lbs) 203.4 202.2 204  BMI 34.91 34.71 35.02    Physical Exam PHYSICAL EXAM:   VS: BP 130/80 (BP Location: Left Arm, Patient Position: Sitting, Cuff Size: Normal)   Pulse 79   Temp (!) 97.4 F (36.3 C) (Temporal)   Ht 5\' 4"  (1.626 m)   Wt 203 lb 6.4 oz (92.3 kg)   SpO2 99%   BMI 34.91 kg/m   GEN: Well nourished, well developed, in no acute distress  HEENT: normal external ears and nose - normal external auditory canals and TMS - hearing grossly normal - normal nasal mucosa and septum - Lips, Teeth and Gums - normal  Oropharynx - normal mucosa, palate, and posterior pharynx Neck: no JVD or masses - no thyromegaly Cardiac: RRR; no murmurs, rubs, or gallops,no edema - no significant varicosities Respiratory:  normal respiratory rate and pattern with no distress - normal breath sounds with no rales, rhonchi, wheezes or rubs GI: normal bowel sounds, no masses or tenderness MS: no deformity or atrophy  Skin: warm and dry, no rash  Neuro:  Alert and Oriented x 3, Strength and sensation are intact - CN II-Xii grossly intact Psych: euthymic mood, appropriate affect and demeanor  Lab Results  Component Value Date   WBC 8.1 10/23/2020   HGB 14.0 10/23/2020   HCT 41.5 10/23/2020   PLT 275 10/23/2020   GLUCOSE 83 10/23/2020   CHOL 165 10/23/2020   TRIG 81 10/23/2020   HDL 54 10/23/2020   LDLCALC 96 10/23/2020   ALT 15 10/23/2020   AST 16 10/23/2020   NA 139 10/23/2020   K 4.0 10/23/2020   CL 104 10/23/2020   CREATININE 0.68 10/23/2020   BUN 10 10/23/2020   CO2 22 10/23/2020   TSH 1.730 05/24/2020      Assessment & Plan:  1. Annual physical exam - PPD - Varicella-zoster vaccine IM (Shingrix) - CBC with Differential/Platelet - Comprehensive metabolic panel - TSH - Lipid panel  2. Need for vaccination - PPD - Varicella-zoster vaccine IM  (Shingrix)  3. BMI 34.0-34.9,adult - phentermine (ADIPEX-P) 37.5 MG tablet; Take 1 tablet (37.5 mg total) by mouth daily before breakfast.  Dispense: 30 tablet; Refill: 1    Body mass index is 34.91 kg/m.   These are the goals we discussed:  Goals   None      This is a list of the screening recommended for you and due dates:  Health Maintenance  Topic Date Due   HIV Screening  Never done   COVID-19 Vaccine (1) 07/08/2021*   Flu Shot  06/18/2021   Zoster (Shingles) Vaccine (  2 of 2) 07/03/2021   Pap Smear  09/21/2021   Tetanus Vaccine  08/21/2022   Mammogram  09/25/2022   Colon Cancer Screening  12/15/2030   Hepatitis C Screening: USPSTF Recommendation to screen - Ages 18-79 yo.  Completed   Pneumococcal Vaccination  Aged Out   HPV Vaccine  Aged Out  *Topic was postponed. The date shown is not the original due date.     AN INDIVIDUALIZED CARE PLAN: was established or reinforced today.   SELF MANAGEMENT: The patient and I together assessed ways to personally work towards obtaining the recommended goals  Support needs The patient and/or family needs were assessed and services were offered if appropriate.  Meds ordered this encounter  Medications   estradiol (ESTRACE) 1 MG tablet    Sig: Take 1 tablet (1 mg total) by mouth daily.    Dispense:  90 tablet    Refill:  1    Order Specific Question:   Supervising Provider    Answer:   [facility], Aniceto Boss   losartan (COZAAR) 100 MG tablet    Sig: TAKE 1 TABLET(100 MG) BY MOUTH DAILY    Dispense:  90 tablet    Refill:  1    Order Specific Question:   Supervising Provider    Answer:   Corey Harold   omeprazole (PRILOSEC) 40 MG capsule    Sig: 1 po qd    Dispense:  90 capsule    Refill:  1    Order Specific Question:   Supervising Provider    Answer:   Corey Harold   phentermine (ADIPEX-P) 37.5 MG tablet    Sig: Take 1 tablet (37.5 mg total) by mouth daily before breakfast.    Dispense:  30 tablet     Refill:  1    Order Specific Question:   Supervising Provider    AnswerCorey Harold    Follow-up: Return in about 2 months (around 07/08/2021) for follow up ---- also cancel appt next week.  An After Visit Summary was printed and given to the patient.  Jettie Pagan [facility] [phone]

## 2021-05-09 LAB — COMPREHENSIVE METABOLIC PANEL
ALT: 24 IU/L (ref 0–32)
AST: 22 IU/L (ref 0–40)
Albumin/Globulin Ratio: 2 (ref 1.2–2.2)
Albumin: 4.4 g/dL (ref 3.8–4.8)
Alkaline Phosphatase: 82 IU/L (ref 44–121)
BUN/Creatinine Ratio: 20 (ref 12–28)
BUN: 14 mg/dL (ref 8–27)
Bilirubin Total: 0.7 mg/dL (ref 0.0–1.2)
CO2: 21 mmol/L (ref 20–29)
Calcium: 9.5 mg/dL (ref 8.7–10.3)
Chloride: 102 mmol/L (ref 96–106)
Creatinine, Ser: 0.69 mg/dL (ref 0.57–1.00)
Globulin, Total: 2.2 g/dL (ref 1.5–4.5)
Glucose: 78 mg/dL (ref 65–99)
Potassium: 4.1 mmol/L (ref 3.5–5.2)
Sodium: 138 mmol/L (ref 134–144)
Total Protein: 6.6 g/dL (ref 6.0–8.5)
eGFR: 99 mL/min/{1.73_m2} (ref >59)

## 2021-05-09 LAB — CBC WITH DIFFERENTIAL/PLATELET
Basophils Absolute: 0 10*3/uL (ref 0.0–0.2)
Basos: 0 %
EOS (ABSOLUTE): 0.1 10*3/uL (ref 0.0–0.4)
Eos: 1 %
Hematocrit: 41.4 % (ref 34.0–46.6)
Hemoglobin: 14 g/dL (ref 11.1–15.9)
Immature Grans (Abs): 0 10*3/uL (ref 0.0–0.1)
Immature Granulocytes: 1 %
Lymphocytes Absolute: 3.1 10*3/uL (ref 0.7–3.1)
Lymphs: 36 %
MCH: 29.3 pg (ref 26.6–33.0)
MCHC: 33.8 g/dL (ref 31.5–35.7)
MCV: 87 fL (ref 79–97)
Monocytes Absolute: 0.4 10*3/uL (ref 0.1–0.9)
Monocytes: 5 %
Neutrophils Absolute: 5.1 10*3/uL (ref 1.4–7.0)
Neutrophils: 57 %
Platelets: 294 10*3/uL (ref 150–450)
RBC: 4.78 x10E6/uL (ref 3.77–5.28)
RDW: 12.9 % (ref 11.7–15.4)
WBC: 8.8 10*3/uL (ref 3.4–10.8)

## 2021-05-09 LAB — LIPID PANEL
Chol/HDL Ratio: 3.4 ratio (ref 0.0–4.4)
Cholesterol, Total: 182 mg/dL (ref 100–199)
HDL: 54 mg/dL (ref >39)
LDL Chol Calc (NIH): 110 mg/dL — ABNORMAL HIGH (ref 0–99)
Triglycerides: 98 mg/dL (ref 0–149)
VLDL Cholesterol Cal: 18 mg/dL (ref 5–40)

## 2021-05-09 LAB — CARDIOVASCULAR RISK ASSESSMENT

## 2021-05-09 LAB — TSH: TSH: 1.57 u[IU]/mL (ref 0.450–4.500)

## 2021-05-11 ENCOUNTER — Other Ambulatory Visit: Payer: Self-pay

## 2021-05-11 ENCOUNTER — Ambulatory Visit (INDEPENDENT_AMBULATORY_CARE_PROVIDER_SITE_OTHER): Payer: BC Managed Care – PPO

## 2021-05-11 DIAGNOSIS — Z111 Encounter for screening for respiratory tuberculosis: Secondary | ICD-10-CM | POA: Diagnosis not present

## 2021-05-11 NOTE — Progress Notes (Signed)
Pt in office for TB read. TB read negative. Papers copied and placed for scan. Pt given paperwork.   Lorita Officer, [place] 05/11/21 10:35 AM

## 2021-05-14 ENCOUNTER — Ambulatory Visit: Payer: BC Managed Care – PPO | Admitting: Physician Assistant

## 2021-07-05 ENCOUNTER — Ambulatory Visit: Payer: BC Managed Care – PPO | Admitting: Physician Assistant

## 2021-07-05 ENCOUNTER — Other Ambulatory Visit: Payer: Self-pay

## 2021-07-05 ENCOUNTER — Encounter: Payer: Self-pay | Admitting: Physician Assistant

## 2021-07-05 VITALS — BP 122/78 | HR 86 | Temp 97.9°F | Ht 64.0 in | Wt 200.8 lb

## 2021-07-05 DIAGNOSIS — Z6834 Body mass index (BMI) 34.0-34.9, adult: Secondary | ICD-10-CM | POA: Diagnosis not present

## 2021-07-05 DIAGNOSIS — I10 Essential (primary) hypertension: Secondary | ICD-10-CM | POA: Diagnosis not present

## 2021-07-05 MED ORDER — PHENTERMINE HCL 37.5 MG PO TABS: 37.5000 mg | ORAL_TABLET | Freq: Every day | ORAL | 1 refills | Status: DC

## 2021-07-05 NOTE — Progress Notes (Signed)
Subjective:  Patient ID: Desiree Holmes, female    DOB: [birth year]  Age: 60 y.o. MRN: [id number]  Chief Complaint  Patient presents with   Weight Loss    Follow up    HPI  Pt presents for follow up of hypertension. The patient is tolerating the medication well without side effects. Compliance with treatment has been good; including taking medication as directed , maintains a healthy diet and regular exercise regimen , and following up as directed.  Pt on adipex - states she is doing well on the medication - has lost a few pounds since last visit - would like to continue  Current Outpatient Medications on File Prior to Visit  Medication Sig Dispense Refill   calcium carbonate (OS-CAL) 600 MG TABS Take 600 mg by mouth 2 (two) times daily with a meal.     estradiol (ESTRACE) 1 MG tablet Take 1 tablet (1 mg total) by mouth daily. 90 tablet 1   Loratadine-Pseudoephedrine (EQ ALLERGY RELIEF D 24 HOUR PO) Take by mouth.     losartan (COZAAR) 100 MG tablet TAKE 1 TABLET(100 MG) BY MOUTH DAILY 90 tablet 1   omeprazole (PRILOSEC) 40 MG capsule 1 po qd 90 capsule 1   vitamin C (ASCORBIC ACID) 500 MG tablet Take 500 mg by mouth daily.     No current facility-administered medications on file prior to visit.   Past Medical History:  Diagnosis Date   Barrett's esophagus    H. pylori infection    History of colon polyps    Past Surgical History:  Procedure Laterality Date   ABDOMINAL HYSTERECTOMY     with BSO   CESAREAN SECTION     x2   DIAGNOSTIC LAPAROSCOPY     DILATION AND CURETTAGE OF UTERUS     MINI LAP STERILIZATION     TOTAL ABDOMINAL HYSTERECTOMY W/ BILATERAL SALPINGOOPHORECTOMY     TUBOPLASTY / TUBOTUBAL ANASTOMOSIS      Family History  Problem Relation Age of Onset   COPD Father    Cancer Father        STOMACH   Diabetes Mother    Hypertension Mother    Thyroid disease Mother    Lupus Sister    Diabetes Maternal Grandmother    Heart disease Maternal  Grandfather    Heart disease Paternal Grandfather    Cancer Paternal Grandfather        COLON   Breast cancer Neg Hx    Social History   Socioeconomic History   Marital status: Married    Spouse name: Not on file   Number of children: 1   Years of education: Not on file   Highest education level: Not on file  Occupational History   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  Tobacco Use   Smoking status: Never   Smokeless tobacco: Never  Vaping Use   Vaping Use: Never used  Substance and Sexual Activity   Alcohol use: No    Alcohol/week: 0.0 standard drinks   Drug use: No   Sexual activity: Not Currently    Birth control/protection: Surgical    Comment: 1st intercourse 82 yo-5 partners  Other Topics Concern   Not on file  Social History Narrative   Not on file   Social Determinants of Health   Financial Resource Strain: Not on file  Food Insecurity: Not on file  Transportation Needs: Not on file  Physical Activity: Not on file  Stress: Not on file  Social Connections: Not on file  Review of Systems  CONSTITUTIONAL: Negative for chills, fatigue, fever, unintentional weight gain and unintentional weight loss.  CARDIOVASCULAR: Negative for chest pain, dizziness, palpitations and pedal edema.  RESPIRATORY: Negative for recent cough and dyspnea.       Objective:  . PHYSICAL EXAM:   VS: BP 122/78 (BP Location: Left Arm, Patient Position: Sitting, Cuff Size: Normal)   Pulse 86   Temp 97.9 F (36.6 C) (Temporal)   Ht 5\' 4"  (1.626 m)   Wt 200 lb 12.8 oz (91.1 kg)   SpO2 94%   BMI 34.47 kg/m   GEN: Well nourished, well developed, in no acute distress  Cardiac: RRR; no murmurs, rubs, or gallops, Respiratory:  normal respiratory rate and pattern with no distress - normal breath sounds with no rales, rhonchi, wheezes or rubs  Psych: euthymic mood, appropriate affect and demeanor   Diabetic Foot Exam - Simple   No data filed      Lab Results  Component Value Date    WBC 8.8 05/08/2021   HGB 14.0 05/08/2021   HCT 41.4 05/08/2021   PLT 294 05/08/2021   GLUCOSE 78 05/08/2021   CHOL 182 05/08/2021   TRIG 98 05/08/2021   HDL 54 05/08/2021   LDLCALC 110 (H) 05/08/2021   ALT 24 05/08/2021   AST 22 05/08/2021   NA 138 05/08/2021   K 4.1 05/08/2021   CL 102 05/08/2021   CREATININE 0.69 05/08/2021   BUN 14 05/08/2021   CO2 21 05/08/2021   TSH 1.570 05/08/2021      Assessment & Plan:   1. Benign hypertension Continue current meds 2. BMI 34.0-34.9,adult - phentermine (ADIPEX-P) 37.5 MG tablet; Take 1 tablet (37.5 mg total) by mouth daily before breakfast.  Dispense: 30 tablet; Refill: 1    Meds ordered this encounter  Medications   phentermine (ADIPEX-P) 37.5 MG tablet    Sig: Take 1 tablet (37.5 mg total) by mouth daily before breakfast.    Dispense:  30 tablet    Refill:  1    Order Specific Question:   Supervising Provider    Answer:   12-07-1974    No orders of the defined types were placed in this encounter.    Follow-up: Return in about 2 months (around 09/04/2021) for follow up.  An After Visit Summary was printed and given to the patient.  09/06/2021 [facility] [phone]

## 2021-07-09 ENCOUNTER — Ambulatory Visit: Payer: BC Managed Care – PPO | Admitting: Physician Assistant

## 2021-07-18 ENCOUNTER — Encounter: Payer: Self-pay | Admitting: Physician Assistant

## 2021-07-18 ENCOUNTER — Ambulatory Visit: Payer: BC Managed Care – PPO | Admitting: Physician Assistant

## 2021-07-18 VITALS — BP 130/86 | HR 86 | Temp 97.4°F | Ht 64.0 in | Wt 200.0 lb

## 2021-07-18 DIAGNOSIS — J060 Acute laryngopharyngitis: Secondary | ICD-10-CM | POA: Diagnosis not present

## 2021-07-18 LAB — POC COVID19 BINAXNOW: SARS Coronavirus 2 Ag: NEGATIVE

## 2021-07-18 MED ORDER — PREDNISONE 20 MG PO TABS: ORAL_TABLET | ORAL | 0 refills | Status: AC

## 2021-07-18 MED ORDER — AZITHROMYCIN 250 MG PO TABS: ORAL_TABLET | ORAL | 0 refills | Status: AC

## 2021-07-18 NOTE — Progress Notes (Signed)
Acute Office Visit  Subjective:    Patient ID: Desiree Holmes, female    DOB: [birth year], 60 y.o.   MRN: [id number]  Chief Complaint  Patient presents with   Sinusitis    Patient states she had a really bad sore throat on Saturday, it is a lot better today. She also states her head feels heavy.     HPI Patient is in today for complaints of sore throat, malaise and headache - states symptoms started about 5 days ago - is feeling some better Has taken 2 home COVID tests which were negative  Past Medical History:  Diagnosis Date   Barrett's esophagus    H. pylori infection    History of colon polyps     Past Surgical History:  Procedure Laterality Date   ABDOMINAL HYSTERECTOMY     with BSO   CESAREAN SECTION     x2   DIAGNOSTIC LAPAROSCOPY     DILATION AND CURETTAGE OF UTERUS     MINI LAP STERILIZATION     TOTAL ABDOMINAL HYSTERECTOMY W/ BILATERAL SALPINGOOPHORECTOMY     TUBOPLASTY / TUBOTUBAL ANASTOMOSIS      Family History  Problem Relation Age of Onset   COPD Father    Cancer Father        STOMACH   Diabetes Mother    Hypertension Mother    Thyroid disease Mother    Lupus Sister    Diabetes Maternal Grandmother    Heart disease Maternal Grandfather    Heart disease Paternal Grandfather    Cancer Paternal Grandfather        COLON   Breast cancer Neg Hx     Social History   Socioeconomic History   Marital status: Married    Spouse name: Not on file   Number of children: 1   Years of education: Not on file   Highest education level: Not on file  Occupational History   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  Tobacco Use   Smoking status: Never   Smokeless tobacco: Never  Vaping Use   Vaping Use: Never used  Substance and Sexual Activity   Alcohol use: No    Alcohol/week: 0.0 standard drinks   Drug use: No   Sexual activity: Not Currently    Birth control/protection: Surgical    Comment: 1st intercourse 58 yo-5 partners  Other Topics Concern   Not  on file  Social History Narrative   Not on file   Social Determinants of Health   Financial Resource Strain: Not on file  Food Insecurity: Not on file  Transportation Needs: Not on file  Physical Activity: Not on file  Stress: Not on file  Social Connections: Not on file  Intimate Partner Violence: Not on file    Outpatient Medications Prior to Visit  Medication Sig Dispense Refill   calcium carbonate (OS-CAL) 600 MG TABS Take 600 mg by mouth 2 (two) times daily with a meal.     estradiol (ESTRACE) 1 MG tablet Take 1 tablet (1 mg total) by mouth daily. 90 tablet 1   Loratadine-Pseudoephedrine (EQ ALLERGY RELIEF D 24 HOUR PO) Take by mouth.     losartan (COZAAR) 100 MG tablet TAKE 1 TABLET(100 MG) BY MOUTH DAILY 90 tablet 1   omeprazole (PRILOSEC) 40 MG capsule 1 po qd 90 capsule 1   phentermine (ADIPEX-P) 37.5 MG tablet Take 1 tablet (37.5 mg total) by mouth daily before breakfast. 30 tablet 1   vitamin C (ASCORBIC ACID) 500 MG tablet  Take 500 mg by mouth daily.     No facility-administered medications prior to visit.    Allergies  Allergen Reactions   Iodinated Diagnostic Agents Other (See Comments)   Sulfa Antibiotics Hives    Review of Systems CONSTITUTIONAL: see HPI E/N/T: see HPI CARDIOVASCULAR: Negative for chest pain, dizziness, palpitations and pedal edema.  RESPIRATORY: Negative for recent cough and dyspnea.  GASTROINTESTINAL: Negative for abdominal pain, acid reflux symptoms, constipation, diarrhea, nausea and vomiting.  MSK: Negative for arthralgias and myalgias.  INTEGUMENTARY: Negative for rash.  NEUROLOGICAL: see HPI     Objective:   PHYSICAL EXAM:   VS: BP 130/86   Pulse 86   Temp (!) 97.4 F (36.3 C)   Ht '5\' 4"'  (1.626 m)   Wt 200 lb (90.7 kg)   SpO2 97%   BMI 34.33 kg/m   GEN: Well nourished, well developed, in no acute distress  HEENT: normal external ears and nose - normal external auditory canals and TMS - h - Lips, Teeth and Gums - normal   Oropharynx - mild erythema with PND noted Cardiac: RRR; no murmurs, rubs, or gallops,no edema - Respiratory:  normal respiratory rate and pattern with no distress - normal breath sounds with no rales, rhonchi, wheezes or rubs   Office Visit on 07/18/2021  Component Date Value Ref Range Status   SARS Coronavirus 2 Ag 07/18/2021 Negative  Negative Final      Health Maintenance Due  Topic Date Due   COVID-19 Vaccine (1) Never done   Zoster Vaccines- Shingrix (2 of 2) 07/03/2021   INFLUENZA VACCINE  06/18/2021    There are no preventive care reminders to display for this patient.   Lab Results  Component Value Date   TSH 1.570 05/08/2021   Lab Results  Component Value Date   WBC 8.8 05/08/2021   HGB 14.0 05/08/2021   HCT 41.4 05/08/2021   MCV 87 05/08/2021   PLT 294 05/08/2021   Lab Results  Component Value Date   NA 138 05/08/2021   K 4.1 05/08/2021   CO2 21 05/08/2021   GLUCOSE 78 05/08/2021   BUN 14 05/08/2021   CREATININE 0.69 05/08/2021   BILITOT 0.7 05/08/2021   ALKPHOS 82 05/08/2021   AST 22 05/08/2021   ALT 24 05/08/2021   PROT 6.6 05/08/2021   ALBUMIN 4.4 05/08/2021   CALCIUM 9.5 05/08/2021   EGFR 99 05/08/2021   Lab Results  Component Value Date   CHOL 182 05/08/2021   Lab Results  Component Value Date   HDL 54 05/08/2021   Lab Results  Component Value Date   LDLCALC 110 (H) 05/08/2021   Lab Results  Component Value Date   TRIG 98 05/08/2021   Lab Results  Component Value Date   CHOLHDL 3.4 05/08/2021   No results found for: HGBA1C     Assessment & Plan:  1. Acute laryngopharyngitis - predniSONE (DELTASONE) 20 MG tablet; Take 3 tablets (60 mg total) by mouth daily with breakfast for 3 days, THEN 2 tablets (40 mg total) daily with breakfast for 3 days, THEN 1 tablet (20 mg total) daily with breakfast for 3 days.  Dispense: 18 tablet; Refill: 0 - azithromycin (ZITHROMAX) 250 MG tablet; Take 2 tablets on day 1, then 1 tablet daily on  days 2 through 5  Dispense: 6 tablet; Refill: 0 - POC COVID-19 BinaxNow    Meds ordered this encounter  Medications   predniSONE (DELTASONE) 20 MG tablet    Sig:  Take 3 tablets (60 mg total) by mouth daily with breakfast for 3 days, THEN 2 tablets (40 mg total) daily with breakfast for 3 days, THEN 1 tablet (20 mg total) daily with breakfast for 3 days.    Dispense:  18 tablet    Refill:  0    Order Specific Question:   Supervising Provider    Answer:   Shelton Silvas   azithromycin (ZITHROMAX) 250 MG tablet    Sig: Take 2 tablets on day 1, then 1 tablet daily on days 2 through 5    Dispense:  6 tablet    Refill:  0    Order Specific Question:   Supervising Provider    AnswerShelton Silvas    Orders Placed This Encounter  Procedures   POC COVID-19 BinaxNow    Follow-up: Return if symptoms worsen or fail to improve.  An After Visit Summary was printed and given to the patient.  Yetta Flock [facility] [phone]

## 2021-08-13 ENCOUNTER — Other Ambulatory Visit: Payer: Self-pay | Admitting: Physician Assistant

## 2021-08-13 DIAGNOSIS — Z1231 Encounter for screening mammogram for malignant neoplasm of breast: Secondary | ICD-10-CM

## 2021-09-04 ENCOUNTER — Encounter: Payer: Self-pay | Admitting: Physician Assistant

## 2021-09-04 ENCOUNTER — Ambulatory Visit: Payer: BC Managed Care – PPO | Admitting: Physician Assistant

## 2021-09-04 ENCOUNTER — Other Ambulatory Visit: Payer: Self-pay

## 2021-09-04 VITALS — BP 128/84 | HR 75 | Temp 97.6°F | Resp 18 | Ht 64.0 in | Wt 199.6 lb

## 2021-09-04 DIAGNOSIS — I10 Essential (primary) hypertension: Secondary | ICD-10-CM | POA: Diagnosis not present

## 2021-09-04 DIAGNOSIS — Z6834 Body mass index (BMI) 34.0-34.9, adult: Secondary | ICD-10-CM | POA: Diagnosis not present

## 2021-09-04 DIAGNOSIS — Z23 Encounter for immunization: Secondary | ICD-10-CM | POA: Diagnosis not present

## 2021-09-04 NOTE — Progress Notes (Addendum)
Subjective:  Patient ID: Desiree Holmes, female    DOB: 1961-08-29  Age: 60 y.o. MRN: [id number]  Chief Complaint  Patient presents with   Hypertension    Hypertension   Pt presents for follow up of hypertension. The patient is tolerating the medication well without side effects. Compliance with treatment has been good; including taking medication as directed , maintains a healthy diet and regular exercise regimen , and following up as directed. Currently on cozaar 100mg  qd  Pt on adipex - states she is doing well on the medication - has lost a few pounds since last visit - would like to continue for a few more months  Current Outpatient Medications on File Prior to Visit  Medication Sig Dispense Refill   calcium carbonate (OS-CAL) 600 MG TABS Take 600 mg by mouth 2 (two) times daily with a meal.     estradiol (ESTRACE) 1 MG tablet Take 1 tablet (1 mg total) by mouth daily. 90 tablet 1   Loratadine-Pseudoephedrine (EQ ALLERGY RELIEF D 24 HOUR PO) Take by mouth.     losartan (COZAAR) 100 MG tablet TAKE 1 TABLET(100 MG) BY MOUTH DAILY 90 tablet 1   omeprazole (PRILOSEC) 40 MG capsule 1 po qd 90 capsule 1   phentermine (ADIPEX-P) 37.5 MG tablet Take 1 tablet (37.5 mg total) by mouth daily before breakfast. 30 tablet 1   vitamin C (ASCORBIC ACID) 500 MG tablet Take 500 mg by mouth daily.     No current facility-administered medications on file prior to visit.   Past Medical History:  Diagnosis Date   Barrett's esophagus    H. pylori infection    History of colon polyps    Past Surgical History:  Procedure Laterality Date   ABDOMINAL HYSTERECTOMY     with BSO   CESAREAN SECTION     x2   DIAGNOSTIC LAPAROSCOPY     DILATION AND CURETTAGE OF UTERUS     MINI LAP STERILIZATION     TOTAL ABDOMINAL HYSTERECTOMY W/ BILATERAL SALPINGOOPHORECTOMY     TUBOPLASTY / TUBOTUBAL ANASTOMOSIS      Family History  Problem Relation Age of Onset   COPD Father    Cancer Father         STOMACH   Diabetes Mother    Hypertension Mother    Thyroid disease Mother    Lupus Sister    Diabetes Maternal Grandmother    Heart disease Maternal Grandfather    Heart disease Paternal Grandfather    Cancer Paternal Grandfather        COLON   Breast cancer Neg Hx    Social History   Socioeconomic History   Marital status: Married    Spouse name: Not on file   Number of children: 1   Years of education: Not on file   Highest education level: Not on file  Occupational History   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  Tobacco Use   Smoking status: Never   Smokeless tobacco: Never  Vaping Use   Vaping Use: Never used  Substance and Sexual Activity   Alcohol use: No    Alcohol/week: 0.0 standard drinks   Drug use: No   Sexual activity: Not Currently    Birth control/protection: Surgical    Comment: 1st intercourse 17 yo-5 partners  Other Topics Concern   Not on file  Social History Narrative   Not on file   Social Determinants of Health   Financial Resource Strain: Not on file  Food Insecurity:  Not on file  Transportation Needs: Not on file  Physical Activity: Not on file  Stress: Not on file  Social Connections: Not on file    Review of Systems CONSTITUTIONAL: Negative for chills, fatigue, fever, unintentional weight gain and unintentional weight loss.  CARDIOVASCULAR: Negative for chest pain, dizziness, palpitations and pedal edema.  RESPIRATORY: Negative for recent cough and dyspnea.  PSYCHIATRIC: Negative for sleep disturbance and to question depression screen.  Negative for depression, negative for anhedonia.       Objective:  . PHYSICAL EXAM:   VS: BP 128/84   Pulse 75   Temp 97.6 F (36.4 C)   Resp 18   Ht 5\' 4"  (1.626 m)   Wt 199 lb 9.6 oz (90.5 kg)   SpO2 99%   BMI 34.26 kg/m   GEN: Well nourished, well developed, in no acute distress  Cardiac: RRR; no murmurs, rubs, or gallops,no edema - Respiratory:  normal respiratory rate and pattern with no  distress - normal breath sounds with no rales, rhonchi, wheezes or rubs Skin: warm and dry, no rash  Psych: euthymic mood, appropriate affect and demeanor  Diabetic Foot Exam - Simple   No data filed      Lab Results  Component Value Date   WBC 8.8 05/08/2021   HGB 14.0 05/08/2021   HCT 41.4 05/08/2021   PLT 294 05/08/2021   GLUCOSE 78 05/08/2021   CHOL 182 05/08/2021   TRIG 98 05/08/2021   HDL 54 05/08/2021   LDLCALC 110 (H) 05/08/2021   ALT 24 05/08/2021   AST 22 05/08/2021   NA 138 05/08/2021   K 4.1 05/08/2021   CL 102 05/08/2021   CREATININE 0.69 05/08/2021   BUN 14 05/08/2021   CO2 21 05/08/2021   TSH 1.570 05/08/2021      Assessment & Plan:   1. Benign hypertension Continue current meds 2. BMI 34.0-34.9,adult - phentermine (ADIPEX-P) 37.5 MG tablet; Take 1 tablet (37.5 mg total) by mouth daily before breakfast.  Dispense: 30 tablet; Refill: 1  3. Need for flu vaccine Flucelvax given 4. Need for shingrix (#2) Shingrix given  No orders of the defined types were placed in this encounter.   Orders Placed This Encounter  Procedures   Varicella-zoster vaccine IM (Shingrix)   Flu Vaccine MDCK QUAD PF      Follow-up: Return in about 3 months (around 12/05/2021) for chronic follow up fasting.  An After Visit Summary was printed and given to the patient.  12/07/2021 [facility] [phone]

## 2021-09-19 ENCOUNTER — Other Ambulatory Visit: Payer: Self-pay | Admitting: Nurse Practitioner

## 2021-09-19 DIAGNOSIS — Z6834 Body mass index (BMI) 34.0-34.9, adult: Secondary | ICD-10-CM

## 2021-10-03 ENCOUNTER — Ambulatory Visit: Payer: BC Managed Care – PPO

## 2021-10-04 ENCOUNTER — Ambulatory Visit
Admission: RE | Admit: 2021-10-04 | Discharge: 2021-10-04 | Disposition: A | Discharge status: Home / Self Care | Payer: BC Managed Care – PPO | Source: Ambulatory Visit | Attending: Physician Assistant | Admitting: Physician Assistant

## 2021-10-04 ENCOUNTER — Other Ambulatory Visit: Payer: Self-pay

## 2021-10-04 ENCOUNTER — Ambulatory Visit (INDEPENDENT_AMBULATORY_CARE_PROVIDER_SITE_OTHER): Payer: BC Managed Care – PPO | Admitting: Nurse Practitioner

## 2021-10-04 ENCOUNTER — Encounter: Payer: Self-pay | Admitting: Nurse Practitioner

## 2021-10-04 VITALS — BP 122/78 | Ht 63.5 in | Wt 200.0 lb

## 2021-10-04 DIAGNOSIS — Z79890 Hormone replacement therapy (postmenopausal): Secondary | ICD-10-CM | POA: Diagnosis not present

## 2021-10-04 DIAGNOSIS — Z780 Asymptomatic menopausal state: Secondary | ICD-10-CM | POA: Diagnosis not present

## 2021-10-04 DIAGNOSIS — Z1231 Encounter for screening mammogram for malignant neoplasm of breast: Secondary | ICD-10-CM | POA: Diagnosis not present

## 2021-10-04 DIAGNOSIS — Z01419 Encounter for gynecological examination (general) (routine) without abnormal findings: Secondary | ICD-10-CM

## 2021-10-04 MED ORDER — ESTRADIOL 1 MG PO TABS: 1.0000 mg | ORAL_TABLET | Freq: Every day | ORAL | 3 refills | Status: DC

## 2021-10-04 NOTE — Progress Notes (Signed)
   Desiree Holmes EXNTZGYF [birth year] [id number]   History:  60 y.o. [id number] presents for annual exam. Postmenopausal - on ERT. S/P 2004 TAH BSO for menorrhagia and ovarian cysts. Normal pap and mammogram history. History of Barrett's esophagus.   Gynecologic History No LMP recorded. Patient has had a hysterectomy.   Contraception/Family planning: status post hysterectomy Sexually active: Yes  Health Maintenance Last Pap: 09/21/2018. Results were: Normal Last mammogram: 09/25/2020 (scheduled today). Results were: Normal Last colonoscopy: 12/15/2020. Results were: Benign polyps, 5-year recall Last Dexa: 10/17/2016. Results were: Normal  Past medical history, past surgical history, family history and social history were all reviewed and documented in the EPIC chart. Married. 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  ROS:  A ROS was performed and pertinent positives and negatives are included.  Exam:  Vitals:   10/04/21 1552  BP: 122/78  Weight: 200 lb (90.7 kg)  Height: 5' 3.5" (1.613 m)   Body mass index is 34.87 kg/m.  General appearance:  Normal Thyroid:  Symmetrical, normal in size, without palpable masses or nodularity. Respiratory  Auscultation:  Clear without wheezing or rhonchi Cardiovascular  Auscultation:  Regular rate, without rubs, murmurs or gallops  Edema/varicosities:  Not grossly evident Abdominal  Soft,nontender, without masses, guarding or rebound.  Liver/spleen:  No organomegaly noted  Hernia:  None appreciated  Skin  Inspection:  Grossly normal Breasts: Examined lying and sitting.   Right: Without masses, retractions, nipple discharge or axillary adenopathy.   Left: Without masses, retractions, nipple discharge or axillary adenopathy. Genitourinary   Inguinal/mons:  Normal without inguinal adenopathy  External genitalia:  Normal appearing vulva with no masses, tenderness, or lesions  BUS/Urethra/Skene's glands:  Normal  Vagina:  Normal  appearing with normal color and discharge, no lesions  Cervix:  Absent  Uterus:  Absent  Adnexa/parametria:     Rt: Normal in size, without masses or tenderness.   Lt: Normal in size, without masses or tenderness.  Anus and perineum: Normal  Digital rectal exam: Normal sphincter tone without palpated masses or tenderness  Patient informed chaperone available to be present for breast and pelvic exam. Patient has requested no chaperone to be present. Patient has been advised what will be completed during breast and pelvic exam.   Assessment/Plan:  60 y.o. [id number] for annual exam.   Well female exam with routine gynecological exam - Education provided on SBEs, importance of preventative screenings, current guidelines, high calcium diet, regular exercise, and multivitamin daily.  Labs with PCP.   Postmenopausal - Plan: DG Bone Density. On ERT. S/P 2004 TAH BSO for menorrhagia and ovarian cysts.   Hormone replacement therapy - Plan: estradiol (ESTRACE) 1 MG tablet daily. Doing well on this and wants to continue. She is aware of risks of blood clots, heart attack, stroke, and breast cancer. Refill x 1 year provided.   Screening for cervical cancer - Normal Pap history. No longer screening per guidelines.   Screening for breast cancer - Normal mammogram history.  Continue annual screenings.  Normal breast exam today. Mammogram scheduled today.   Screening for colon cancer - 11/2020 colonoscopy. Will repeat at GI's recommended interval.   Screening for osteoporosis - Normal bone density in 2017. Will repeat DXA now.   Return in 1 year for annual.   Olivia Mackie DNP, 4:08 PM 10/04/2021

## 2021-10-18 ENCOUNTER — Other Ambulatory Visit: Payer: Self-pay | Admitting: Physician Assistant

## 2021-10-18 ENCOUNTER — Telehealth: Payer: Self-pay

## 2021-10-18 MED ORDER — AMOXICILLIN 875 MG PO TABS: 875.0000 mg | ORAL_TABLET | Freq: Two times a day (BID) | ORAL | 0 refills | Status: AC

## 2021-10-18 NOTE — Telephone Encounter (Signed)
Patient called and stated she is having sinus issues, denies fever. Patient has green drainage. Wanted to know if you can send her something in. Please advise

## 2021-10-19 NOTE — Telephone Encounter (Signed)
Patient made aware, verbalized understanding

## 2021-10-26 DIAGNOSIS — J069 Acute upper respiratory infection, unspecified: Secondary | ICD-10-CM | POA: Diagnosis not present

## 2021-10-26 DIAGNOSIS — Z20822 Contact with and (suspected) exposure to covid-19: Secondary | ICD-10-CM | POA: Diagnosis not present

## 2021-10-26 DIAGNOSIS — R509 Fever, unspecified: Secondary | ICD-10-CM | POA: Diagnosis not present

## 2021-10-26 DIAGNOSIS — M2550 Pain in unspecified joint: Secondary | ICD-10-CM | POA: Diagnosis not present

## 2021-11-08 ENCOUNTER — Other Ambulatory Visit: Payer: Self-pay | Admitting: Physician Assistant

## 2021-11-20 ENCOUNTER — Other Ambulatory Visit: Payer: Self-pay

## 2021-11-20 DIAGNOSIS — Z79890 Hormone replacement therapy (postmenopausal): Secondary | ICD-10-CM

## 2021-11-20 MED ORDER — ESTRADIOL 1 MG PO TABS: 1.0000 mg | ORAL_TABLET | Freq: Every day | ORAL | 1 refills | Status: DC

## 2021-12-14 ENCOUNTER — Other Ambulatory Visit: Payer: Self-pay | Admitting: Physician Assistant

## 2021-12-17 ENCOUNTER — Other Ambulatory Visit: Payer: Self-pay

## 2021-12-17 ENCOUNTER — Encounter: Payer: Self-pay | Admitting: Physician Assistant

## 2021-12-17 ENCOUNTER — Ambulatory Visit: Payer: BC Managed Care – PPO | Admitting: Physician Assistant

## 2021-12-17 VITALS — BP 110/78 | HR 85 | Temp 98.5°F | Ht 63.5 in | Wt 199.0 lb

## 2021-12-17 DIAGNOSIS — K219 Gastro-esophageal reflux disease without esophagitis: Secondary | ICD-10-CM

## 2021-12-17 DIAGNOSIS — I10 Essential (primary) hypertension: Secondary | ICD-10-CM

## 2021-12-17 DIAGNOSIS — J060 Acute laryngopharyngitis: Secondary | ICD-10-CM | POA: Diagnosis not present

## 2021-12-17 LAB — POC COVID19 BINAXNOW: SARS Coronavirus 2 Ag: NEGATIVE

## 2021-12-17 MED ORDER — CEFDINIR 300 MG PO CAPS: 300.0000 mg | ORAL_CAPSULE | Freq: Two times a day (BID) | ORAL | 0 refills | Status: DC

## 2021-12-17 NOTE — Progress Notes (Signed)
Subjective:  Patient ID: Desiree Holmes, female    DOB: [birth year]  Age: 61 y.o. MRN: [id number]  Chief Complaint  Patient presents with   Gastroesophageal Reflux   Hypertension   Nasal Congestion    Started yesterday   Cough    Coughing up flim    HPI  Pt presents for follow up of hypertension. The patient is tolerating the medication well without side effects. Compliance with treatment has been good; including taking medication as directed , maintains a healthy diet and regular exercise regimen , and following up as directed.she is currently on cozaar 100mg  qd  Pt with history of GERD - stable on prilosec 40mg  qd  Pt complains of cough, cold and congestion that started yesterday - complains of facial pressure and pnd - denies fever - cough has been dry  Current Outpatient Medications on File Prior to Visit  Medication Sig Dispense Refill   calcium carbonate (OS-CAL) 600 MG TABS Take 600 mg by mouth 2 (two) times daily with a meal.     ELDERBERRY PO Take 1 tablet by mouth daily.     estradiol (ESTRACE) 1 MG tablet Take 1 tablet (1 mg total) by mouth daily. 90 tablet 1   Loratadine-Pseudoephedrine (EQ ALLERGY RELIEF D 24 HOUR PO) Take by mouth.     omeprazole (PRILOSEC) 40 MG capsule TAKE 1 CAPSULE BY MOUTH EVERY DAY 90 capsule 1   phentermine (ADIPEX-P) 37.5 MG tablet TAKE 1 TABLET(37.5 MG) BY MOUTH DAILY BEFORE BREAKFAST 30 tablet 0   vitamin C (ASCORBIC ACID) 500 MG tablet Take 500 mg by mouth daily.     losartan (COZAAR) 100 MG tablet TAKE 1 TABLET(100 MG) BY MOUTH DAILY 90 tablet 1   No current facility-administered medications on file prior to visit.   Past Medical History:  Diagnosis Date   Barrett's esophagus    H. pylori infection    History of colon polyps    Past Surgical History:  Procedure Laterality Date   ABDOMINAL HYSTERECTOMY     with BSO   CESAREAN SECTION     x2   DIAGNOSTIC LAPAROSCOPY     DILATION AND CURETTAGE OF UTERUS     MINI LAP  STERILIZATION     TOTAL ABDOMINAL HYSTERECTOMY W/ BILATERAL SALPINGOOPHORECTOMY     TUBOPLASTY / TUBOTUBAL ANASTOMOSIS      Family History  Problem Relation Age of Onset   COPD Father    Cancer Father        STOMACH   Diabetes Mother    Hypertension Mother    Thyroid disease Mother    Lupus Sister    Diabetes Maternal Grandmother    Heart disease Maternal Grandfather    Heart disease Paternal Grandfather    Cancer Paternal Grandfather        COLON   Breast cancer Neg Hx    Social History   Socioeconomic History   Marital status: Married    Spouse name: Not on file   Number of children: 1   Years of education: Not on file   Highest education level: Not on file  Occupational History   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  Tobacco Use   Smoking status: Never   Smokeless tobacco: Never  Vaping Use   Vaping Use: Never used  Substance and Sexual Activity   Alcohol use: No    Alcohol/week: 0.0 standard drinks   Drug use: No   Sexual activity: Not Currently    Birth control/protection: Surgical  Comment: 1st intercourse 61 yo-5 partners  Other Topics Concern   Not on file  Social History Narrative   Not on file   Social Determinants of Health   Financial Resource Strain: Not on file  Food Insecurity: Not on file  Transportation Needs: Not on file  Physical Activity: Not on file  Stress: Not on file  Social Connections: Not on file    Review of Systems CONSTITUTIONAL: Negative for chills, fatigue, fever, unintentional weight gain and unintentional weight loss.  E/N/T: see HPI CARDIOVASCULAR: Negative for chest pain, dizziness, palpitations and pedal edema.  RESPIRATORY: see HPI GASTROINTESTINAL: Negative for abdominal pain, acid reflux symptoms, constipation, diarrhea, nausea and vomiting.   INTEGUMENTARY: Negative for rash.  NEUROLOGICAL: Negative for dizziness and headaches.        Objective:  PHYSICAL EXAM:   VS: BP 110/78    Pulse 85    Temp 98.5 F (36.9 C)     Ht 5' 3.5" (1.613 m)    Wt 199 lb (90.3 kg)    SpO2 95%    BMI 34.70 kg/m   GEN: Well nourished, well developed, in no acute distress  HEENT: normal external ears and nose - normal external auditory canals and TMS - - Lips, Teeth and Gums - normal  Oropharynx - erythema/pnd Neck: no JVD or masses - no thyromegaly Cardiac: RRR; no murmurs, rubs, or gallops,no edema -  Respiratory:  normal respiratory rate and pattern with no distress - normal breath sounds with no rales, rhonchi, wheezes or rubs GI: normal bowel sounds, no masses or tenderness MS: no deformity or atrophy  Skin: warm and dry, no rash  Psych: euthymic mood, appropriate affect and demeanor  Office Visit on 12/17/2021  Component Date Value Ref Range Status   SARS Coronavirus 2 Ag 12/17/2021 Negative  Negative Final    Diabetic Foot Exam - Simple   No data filed      Lab Results  Component Value Date   WBC 8.8 05/08/2021   HGB 14.0 05/08/2021   HCT 41.4 05/08/2021   PLT 294 05/08/2021   GLUCOSE 78 05/08/2021   CHOL 182 05/08/2021   TRIG 98 05/08/2021   HDL 54 05/08/2021   LDLCALC 110 (H) 05/08/2021   ALT 24 05/08/2021   AST 22 05/08/2021   NA 138 05/08/2021   K 4.1 05/08/2021   CL 102 05/08/2021   CREATININE 0.69 05/08/2021   BUN 14 05/08/2021   CO2 21 05/08/2021   TSH 1.570 05/08/2021      Assessment & Plan:   Problem List Items Addressed This Visit       Cardiovascular and Mediastinum   Benign hypertension   Relevant Orders   CBC with Differential/Platelet   Comprehensive metabolic panel   Lipid panel     Respiratory   Acute laryngopharyngitis - Primary   Relevant Medications   cefdinir (OMNICEF) 300 MG capsule   Other Relevant Orders   POC COVID-19 BinaxNow   Other Visit Diagnoses     Gastroesophageal reflux disease without esophagitis     Continue current meds     .  Meds ordered this encounter  Medications   cefdinir (OMNICEF) 300 MG capsule    Sig: Take 1 capsule (300  mg total) by mouth 2 (two) times daily.    Dispense:  20 capsule    Refill:  0    Order Specific Question:   Supervising Provider    AnswerBlane Ohara:   COX, KIRSTEN [phone][[id number]]  Orders Placed This Encounter  Procedures   CBC with Differential/Platelet   Comprehensive metabolic panel   Lipid panel   POC COVID-19 BinaxNow     Follow-up: Return in about 6 months (around 06/16/2022) for chronic fasting follow up.  An After Visit Summary was printed and given to the patient.  Jettie Pagan [facility] [phone]

## 2021-12-18 LAB — COMPREHENSIVE METABOLIC PANEL
ALT: 21 IU/L (ref 0–32)
AST: 20 IU/L (ref 0–40)
Albumin/Globulin Ratio: 2 (ref 1.2–2.2)
Albumin: 4.2 g/dL (ref 3.8–4.8)
Alkaline Phosphatase: 82 IU/L (ref 44–121)
BUN/Creatinine Ratio: 19 (ref 12–28)
BUN: 12 mg/dL (ref 8–27)
Bilirubin Total: 0.5 mg/dL (ref 0.0–1.2)
CO2: 23 mmol/L (ref 20–29)
Calcium: 9.1 mg/dL (ref 8.7–10.3)
Chloride: 105 mmol/L (ref 96–106)
Creatinine, Ser: 0.63 mg/dL (ref 0.57–1.00)
Globulin, Total: 2.1 g/dL (ref 1.5–4.5)
Glucose: 81 mg/dL (ref 70–99)
Potassium: 3.8 mmol/L (ref 3.5–5.2)
Sodium: 140 mmol/L (ref 134–144)
Total Protein: 6.3 g/dL (ref 6.0–8.5)
eGFR: 100 mL/min/{1.73_m2} (ref >59)

## 2021-12-18 LAB — CBC WITH DIFFERENTIAL/PLATELET
Basophils Absolute: 0 10*3/uL (ref 0.0–0.2)
Basos: 0 %
EOS (ABSOLUTE): 0.1 10*3/uL (ref 0.0–0.4)
Eos: 2 %
Hematocrit: 42.2 % (ref 34.0–46.6)
Hemoglobin: 14.1 g/dL (ref 11.1–15.9)
Immature Grans (Abs): 0 10*3/uL (ref 0.0–0.1)
Immature Granulocytes: 0 %
Lymphocytes Absolute: 2.7 10*3/uL (ref 0.7–3.1)
Lymphs: 33 %
MCH: 29.3 pg (ref 26.6–33.0)
MCHC: 33.4 g/dL (ref 31.5–35.7)
MCV: 88 fL (ref 79–97)
Monocytes Absolute: 0.3 10*3/uL (ref 0.1–0.9)
Monocytes: 4 %
Neutrophils Absolute: 5 10*3/uL (ref 1.4–7.0)
Neutrophils: 61 %
Platelets: 269 10*3/uL (ref 150–450)
RBC: 4.81 x10E6/uL (ref 3.77–5.28)
RDW: 13 % (ref 11.7–15.4)
WBC: 8.2 10*3/uL (ref 3.4–10.8)

## 2021-12-18 LAB — LIPID PANEL
Chol/HDL Ratio: 3 ratio (ref 0.0–4.4)
Cholesterol, Total: 156 mg/dL (ref 100–199)
HDL: 52 mg/dL (ref >39)
LDL Chol Calc (NIH): 90 mg/dL (ref 0–99)
Triglycerides: 73 mg/dL (ref 0–149)
VLDL Cholesterol Cal: 14 mg/dL (ref 5–40)

## 2021-12-18 LAB — CARDIOVASCULAR RISK ASSESSMENT

## 2021-12-25 ENCOUNTER — Other Ambulatory Visit: Payer: Self-pay | Admitting: Nurse Practitioner

## 2021-12-25 ENCOUNTER — Other Ambulatory Visit: Payer: Self-pay

## 2021-12-25 ENCOUNTER — Ambulatory Visit (INDEPENDENT_AMBULATORY_CARE_PROVIDER_SITE_OTHER): Payer: BC Managed Care – PPO

## 2021-12-25 DIAGNOSIS — Z13820 Encounter for screening for osteoporosis: Secondary | ICD-10-CM | POA: Diagnosis not present

## 2021-12-25 DIAGNOSIS — Z780 Asymptomatic menopausal state: Secondary | ICD-10-CM

## 2022-01-24 ENCOUNTER — Telehealth: Payer: Self-pay

## 2022-01-24 NOTE — Telephone Encounter (Signed)
I left a message on the number(s) listed in the patients chart requesting the patient to call back regarding the upcomming appointment for 06/25/2022. The provider is out of the office that day. The appointment has been canceled. Waiting for the patient to return the call. ?

## 2022-02-20 ENCOUNTER — Other Ambulatory Visit: Payer: Self-pay

## 2022-02-20 DIAGNOSIS — Z6834 Body mass index (BMI) 34.0-34.9, adult: Secondary | ICD-10-CM

## 2022-02-21 ENCOUNTER — Other Ambulatory Visit: Payer: Self-pay | Admitting: Nurse Practitioner

## 2022-02-21 DIAGNOSIS — Z6834 Body mass index (BMI) 34.0-34.9, adult: Secondary | ICD-10-CM

## 2022-04-01 ENCOUNTER — Ambulatory Visit: Payer: BC Managed Care – PPO | Admitting: Physician Assistant

## 2022-04-01 ENCOUNTER — Encounter: Payer: Self-pay | Admitting: Physician Assistant

## 2022-04-01 VITALS — BP 120/68 | HR 88 | Temp 97.1°F | Ht 63.5 in | Wt 210.4 lb

## 2022-04-01 DIAGNOSIS — I10 Essential (primary) hypertension: Secondary | ICD-10-CM | POA: Diagnosis not present

## 2022-04-01 DIAGNOSIS — E669 Obesity, unspecified: Secondary | ICD-10-CM

## 2022-04-01 DIAGNOSIS — Z6834 Body mass index (BMI) 34.0-34.9, adult: Secondary | ICD-10-CM | POA: Diagnosis not present

## 2022-04-01 MED ORDER — PHENTERMINE HCL 37.5 MG PO TABS: ORAL_TABLET | ORAL | 1 refills | Status: DC

## 2022-04-01 NOTE — Progress Notes (Signed)
? ?Subjective:  ?Patient ID: Desiree Holmes, female    DOB: June 29, 1961  Age: 61 y.o. MRN: [id number] ? ?Chief Complaint  ?Patient presents with  ? Medication Refill  ? ? ?HPI ? Pt presents for follow up of hypertension. The patient is tolerating the medication well without side effects. Compliance with treatment has been good; including taking medication as directed , maintains a healthy diet and regular exercise regimen , and following up as directed.she is currenlty taking cozaar 100mg  qd ? ?Pt with history of obesity.  Had been on adipex in the past and did well with medication - has been off for about 5 months - would like to restart again.  Has gained 10 pounds since last visit but states has been eating a lot of fast food lately - will work on diet ? ?Current Outpatient Medications on File Prior to Visit  ?Medication Sig Dispense Refill  ? calcium carbonate (OS-CAL) 600 MG TABS Take 600 mg by mouth 2 (two) times Holmes with a meal.    ? estradiol (ESTRACE) 1 MG tablet Take 1 tablet (1 mg total) by mouth Holmes. 90 tablet 1  ? Loratadine-Pseudoephedrine (EQ ALLERGY RELIEF D 24 HOUR PO) Take by mouth.    ? losartan (COZAAR) 100 MG tablet TAKE 1 TABLET(100 MG) BY MOUTH Holmes 90 tablet 1  ? omeprazole (PRILOSEC) 40 MG capsule TAKE 1 CAPSULE BY MOUTH EVERY DAY 90 capsule 1  ? vitamin C (ASCORBIC ACID) 500 MG tablet Take 500 mg by mouth Holmes.    ? ?No current facility-administered medications on file prior to visit.  ? ?Past Medical History:  ?Diagnosis Date  ? Barrett's esophagus   ? H. pylori infection   ? History of colon polyps   ? ?Past Surgical History:  ?Procedure Laterality Date  ? ABDOMINAL HYSTERECTOMY    ? with BSO  ? CESAREAN SECTION    ? x2  ? DIAGNOSTIC LAPAROSCOPY    ? DILATION AND CURETTAGE OF UTERUS    ? MINI LAP STERILIZATION    ? TOTAL ABDOMINAL HYSTERECTOMY W/ BILATERAL SALPINGOOPHORECTOMY    ? TUBOPLASTY / TUBOTUBAL ANASTOMOSIS    ?  ?Family History  ?Problem Relation Age of Onset  ?  COPD Father   ? Cancer Father   ?     STOMACH  ? Diabetes Mother   ? Hypertension Mother   ? Thyroid disease Mother   ? Lupus Sister   ? Diabetes Maternal Grandmother   ? Heart disease Maternal Grandfather   ? Heart disease Paternal Grandfather   ? Cancer Paternal Grandfather   ?     COLON  ? Breast cancer Neg Hx   ? ?Social History  ? ?Socioeconomic History  ? Marital status: Married  ?  Spouse name: Not on file  ? Number of children: 1  ? Years of education: Not on file  ? Highest education level: Not on file  ?Occupational History  ? 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  ?Tobacco Use  ? Smoking status: Never  ? Smokeless tobacco: Never  ?Vaping Use  ? Vaping Use: Never used  ?Substance and Sexual Activity  ? Alcohol use: No  ?  Alcohol/week: 0.0 standard drinks  ? Drug use: No  ? Sexual activity: Not Currently  ?  Birth control/protection: Surgical  ?  Comment: 1st intercourse 20 yo-5 partners  ?Other Topics Concern  ? Not on file  ?Social History Narrative  ? Not on file  ? ?Social Determinants of Health  ? ?  Financial Resource Strain: Not on file  ?Food Insecurity: Not on file  ?Transportation Needs: Not on file  ?Physical Activity: Not on file  ?Stress: Not on file  ?Social Connections: Not on file  ? ? ?Review of Systems ?CONSTITUTIONAL: Negative for chills, fatigue, fever, unintentional weight gain and unintentional weight loss.  ?CARDIOVASCULAR: Negative for chest pain, dizziness, palpitations and pedal edema.  ?RESPIRATORY: Negative for recent cough and dyspnea.  ?GASTROINTESTINAL: Negative for abdominal pain, acid reflux symptoms, constipation, diarrhea, nausea and vomiting.  ? ?   ? ? ?Objective:  ?PHYSICAL EXAM:  ? ?VS: BP 120/68   Pulse 88   Temp (!) 97.1 ?F (36.2 ?C)   Ht 5' 3.5" (1.613 m)   Wt 210 lb 6.4 oz (95.4 kg)   SpO2 98%   BMI 36.69 kg/m?  ? ?GEN: Well nourished, well developed, in no acute distress  ?Cardiac: RRR; no murmurs,  ?Respiratory:  normal respiratory rate and pattern with no distress -  normal breath sounds with no rales, rhonchi, wheezes or rubs ? ?Psych: euthymic mood, appropriate affect and demeanor ? ?Lab Results  ?Component Value Date  ? WBC 8.2 12/17/2021  ? HGB 14.1 12/17/2021  ? HCT 42.2 12/17/2021  ? PLT 269 12/17/2021  ? GLUCOSE 81 12/17/2021  ? CHOL 156 12/17/2021  ? TRIG 73 12/17/2021  ? HDL 52 12/17/2021  ? LDLCALC 90 12/17/2021  ? ALT 21 12/17/2021  ? AST 20 12/17/2021  ? NA 140 12/17/2021  ? K 3.8 12/17/2021  ? CL 105 12/17/2021  ? CREATININE 0.63 12/17/2021  ? BUN 12 12/17/2021  ? CO2 23 12/17/2021  ? TSH 1.570 05/08/2021  ? ? ? ? ?Assessment & Plan:  ? ?Problem List Items Addressed This Visit   ? ?  ? Cardiovascular and Mediastinum  ? Benign hypertension - Primary ?Continue current meds  ?  ? Other  ? Obesity, Class II, BMI 35-39.9 ?Watch diet/exercise ?Restart phentermine  ?. ? ?Meds ordered this encounter  ?Medications  ? phentermine (ADIPEX-P) 37.5 MG tablet  ?  Sig: TAKE 1 TABLET(37.5 MG) BY MOUTH Holmes BEFORE BREAKFAST  ?  Dispense:  30 tablet  ?  Refill:  1  ?  Order Specific Question:   Supervising Provider  ?  AnswerBlane Ohara [[id number]]  ? ? ?No orders of the defined types were placed in this encounter. ?  ? ?Follow-up: Return in about 2 months (around 06/01/2022) for fasting follow up -. ? ?An After Visit Summary was printed and given to the patient. ? ?SARA R Philomene Haff, PA-C ?[facility] ?([phone] ?

## 2022-05-11 ENCOUNTER — Other Ambulatory Visit: Payer: Self-pay | Admitting: Physician Assistant

## 2022-05-11 DIAGNOSIS — Z79890 Hormone replacement therapy (postmenopausal): Secondary | ICD-10-CM

## 2022-05-13 ENCOUNTER — Other Ambulatory Visit: Payer: Self-pay | Admitting: Physician Assistant

## 2022-05-13 DIAGNOSIS — Z79890 Hormone replacement therapy (postmenopausal): Secondary | ICD-10-CM

## 2022-05-15 ENCOUNTER — Other Ambulatory Visit: Payer: Self-pay

## 2022-05-27 ENCOUNTER — Other Ambulatory Visit: Payer: Self-pay | Admitting: Physician Assistant

## 2022-05-27 DIAGNOSIS — Z6834 Body mass index (BMI) 34.0-34.9, adult: Secondary | ICD-10-CM

## 2022-06-05 ENCOUNTER — Ambulatory Visit: Payer: BC Managed Care – PPO | Admitting: Physician Assistant

## 2022-06-05 ENCOUNTER — Encounter: Payer: Self-pay | Admitting: Physician Assistant

## 2022-06-05 VITALS — BP 118/80 | HR 87 | Resp 18 | Ht 63.5 in | Wt 195.2 lb

## 2022-06-05 DIAGNOSIS — T1591XA Foreign body on external eye, part unspecified, right eye, initial encounter: Secondary | ICD-10-CM

## 2022-06-05 DIAGNOSIS — E669 Obesity, unspecified: Secondary | ICD-10-CM

## 2022-06-05 DIAGNOSIS — I10 Essential (primary) hypertension: Secondary | ICD-10-CM

## 2022-06-05 DIAGNOSIS — H16011 Central corneal ulcer, right eye: Secondary | ICD-10-CM | POA: Diagnosis not present

## 2022-06-05 DIAGNOSIS — K219 Gastro-esophageal reflux disease without esophagitis: Secondary | ICD-10-CM | POA: Insufficient documentation

## 2022-06-05 NOTE — Progress Notes (Signed)
Subjective:  Patient ID: Desiree Holmes, female    DOB: 1961-11-11  Age: 61 y.o. MRN: [id number]  Chief Complaint  Patient presents with   Hypertension    HPI  Pt presents for follow up of hypertension. The patient is tolerating the medication well without side effects. Compliance with treatment has been good; including taking medication as directed , maintains a healthy diet and regular exercise regimen , and following up as directed. Pt currently taking losartan 100mg  qd  Pt with history of GERD - stable on prilosec 40mg  qd  Pt here for follow up weight management.  She has lost 15 pounds on adipex and is doing well.  Tolerating medication well and voices no concerns as far as that goes  Pt states that for the past 7-10 days her right eye has been red, inflamed and watery at times.  Feels like she may have something in it.  She does wear contacts but has had it out since issues started.  No visual acuity problems  Current Outpatient Medications on File Prior to Visit  Medication Sig Dispense Refill   calcium carbonate (OS-CAL) 600 MG TABS Take 600 mg by mouth 2 (two) times daily with a meal.     estradiol (ESTRACE) 1 MG tablet TAKE 1 TABLET(1 MG) BY MOUTH DAILY 90 tablet 1   Loratadine-Pseudoephedrine (EQ ALLERGY RELIEF D 24 HOUR PO) Take by mouth.     losartan (COZAAR) 100 MG tablet TAKE 1 TABLET(100 MG) BY MOUTH DAILY 90 tablet 1   Magnesium 250 MG TABS Take by mouth.     omeprazole (PRILOSEC) 40 MG capsule TAKE 1 CAPSULE BY MOUTH EVERY DAY 90 capsule 1   phentermine (ADIPEX-P) 37.5 MG tablet TAKE 1 TABLET(37.5 MG) BY MOUTH DAILY BEFORE BREAKFAST 30 tablet 0   vitamin C (ASCORBIC ACID) 500 MG tablet Take 500 mg by mouth daily.     No current facility-administered medications on file prior to visit.   Past Medical History:  Diagnosis Date   Barrett's esophagus    H. pylori infection    History of colon polyps    Past Surgical History:  Procedure Laterality Date    ABDOMINAL HYSTERECTOMY     with BSO   CESAREAN SECTION     x2   DIAGNOSTIC LAPAROSCOPY     DILATION AND CURETTAGE OF UTERUS     MINI LAP STERILIZATION     TOTAL ABDOMINAL HYSTERECTOMY W/ BILATERAL SALPINGOOPHORECTOMY     TUBOPLASTY / TUBOTUBAL ANASTOMOSIS      Family History  Problem Relation Age of Onset   COPD Father    Cancer Father        STOMACH   Diabetes Mother    Hypertension Mother    Thyroid disease Mother    Lupus Sister    Diabetes Maternal Grandmother    Heart disease Maternal Grandfather    Heart disease Paternal Grandfather    Cancer Paternal Grandfather        COLON   Breast cancer Neg Hx    Social History   Socioeconomic History   Marital status: Married    Spouse name: Not on file   Number of children: 1   Years of education: Not on file   Highest education level: Not on file  Occupational History   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  Tobacco Use   Smoking status: Never   Smokeless tobacco: Never  Vaping Use   Vaping Use: Never used  Substance and Sexual Activity  Alcohol use: No    Alcohol/week: 0.0 standard drinks of alcohol   Drug use: No   Sexual activity: Not Currently    Birth control/protection: Surgical    Comment: 1st intercourse 65 yo-5 partners  Other Topics Concern   Not on file  Social History Narrative   Not on file   Social Determinants of Health   Financial Resource Strain: Not on file  Food Insecurity: Not on file  Transportation Needs: Not on file  Physical Activity: Not on file  Stress: Not on file  Social Connections: Not on file    Review of Systems CONSTITUTIONAL: Negative for chills, fatigue, fever, unintentional weight gain and unintentional weight loss.  EYEs - see HPI E/N/T: Negative for ear pain, nasal congestion and sore throat.  CARDIOVASCULAR: Negative for chest pain, dizziness, palpitations and pedal edema.  RESPIRATORY: Negative for recent cough and dyspnea.  GASTROINTESTINAL: Negative for abdominal pain,  acid reflux symptoms, constipation, diarrhea, nausea and vomiting.  MSK: Negative for arthralgias and myalgias.  INTEGUMENTARY: Negative for rash.  NEUROLOGICAL: Negative for dizziness and headaches.  PSYCHIATRIC: Negative for sleep disturbance and to question depression screen.  Negative for depression, negative for anhedonia.       Objective:  PHYSICAL EXAM:   VS: BP 118/80   Pulse 87   Resp 18   Ht 5' 3.5" (1.613 m)   Wt 195 lb 3.2 oz (88.5 kg)   SpO2 99%   BMI 34.04 kg/m   GEN: Well nourished, well developed, in no acute distress  Eye - right eye is noted to have foreign body at 11 oclock position - fluroscein stain done Cardiac: RRR; no murmurs, rubs, or gallops,no edema - Respiratory:  normal respiratory rate and pattern with no distress - normal breath sounds with no rales, rhonchi, wheezes or rubs GI: normal bowel sounds, no masses or tenderness MS: no deformity or atrophy  Skin: warm and dry, no rash   Psych: euthymic mood, appropriate affect and demeanor   Lab Results  Component Value Date   WBC 8.2 12/17/2021   HGB 14.1 12/17/2021   HCT 42.2 12/17/2021   PLT 269 12/17/2021   GLUCOSE 81 12/17/2021   CHOL 156 12/17/2021   TRIG 73 12/17/2021   HDL 52 12/17/2021   LDLCALC 90 12/17/2021   ALT 21 12/17/2021   AST 20 12/17/2021   NA 140 12/17/2021   K 3.8 12/17/2021   CL 105 12/17/2021   CREATININE 0.63 12/17/2021   BUN 12 12/17/2021   CO2 23 12/17/2021   TSH 1.570 05/08/2021      Assessment & Plan:   Problem List Items Addressed This Visit       Cardiovascular and Mediastinum   Benign hypertension - Primary   Relevant Orders   CBC with Differential/Platelet   Comprehensive metabolic panel   TSH   Lipid panel Continue meds     Digestive   Gastroesophageal reflux disease without esophagitis Continue meds     Other   Obesity, Class II, BMI 35-39.9 Watch diet Continue phentermine   Foreign body, eye, right, initial encounter Referral to  Dr Dan Humphreys - appt today at 3:45  .  No orders of the defined types were placed in this encounter.   Orders Placed This Encounter  Procedures   CBC with Differential/Platelet   Comprehensive metabolic panel   TSH   Lipid panel     Follow-up: Return in about 2 months (around 08/06/2022) for follow up.  An After Visit Summary  was printed and given to the patient.  SARA R Daphyne Miguez, PA-C [facility] [phone] 

## 2022-06-06 LAB — CBC WITH DIFFERENTIAL/PLATELET
Basophils Absolute: 0 10*3/uL (ref 0.0–0.2)
Basos: 0 %
EOS (ABSOLUTE): 0.1 10*3/uL (ref 0.0–0.4)
Eos: 1 %
Hematocrit: 40.6 % (ref 34.0–46.6)
Hemoglobin: 13.9 g/dL (ref 11.1–15.9)
Immature Grans (Abs): 0 10*3/uL (ref 0.0–0.1)
Immature Granulocytes: 0 %
Lymphocytes Absolute: 2.9 10*3/uL (ref 0.7–3.1)
Lymphs: 37 %
MCH: 29.7 pg (ref 26.6–33.0)
MCHC: 34.2 g/dL (ref 31.5–35.7)
MCV: 87 fL (ref 79–97)
Monocytes Absolute: 0.4 10*3/uL (ref 0.1–0.9)
Monocytes: 5 %
Neutrophils Absolute: 4.3 10*3/uL (ref 1.4–7.0)
Neutrophils: 57 %
Platelets: 276 10*3/uL (ref 150–450)
RBC: 4.68 x10E6/uL (ref 3.77–5.28)
RDW: 13.3 % (ref 11.7–15.4)
WBC: 7.8 10*3/uL (ref 3.4–10.8)

## 2022-06-06 LAB — LIPID PANEL
Chol/HDL Ratio: 3.3 ratio (ref 0.0–4.4)
Cholesterol, Total: 161 mg/dL (ref 100–199)
HDL: 49 mg/dL (ref >39)
LDL Chol Calc (NIH): 94 mg/dL (ref 0–99)
Triglycerides: 98 mg/dL (ref 0–149)
VLDL Cholesterol Cal: 18 mg/dL (ref 5–40)

## 2022-06-06 LAB — COMPREHENSIVE METABOLIC PANEL
ALT: 20 IU/L (ref 0–32)
AST: 19 IU/L (ref 0–40)
Albumin/Globulin Ratio: 2 (ref 1.2–2.2)
Albumin: 4.3 g/dL (ref 3.9–4.9)
Alkaline Phosphatase: 84 IU/L (ref 44–121)
BUN/Creatinine Ratio: 17 (ref 12–28)
BUN: 12 mg/dL (ref 8–27)
Bilirubin Total: 0.8 mg/dL (ref 0.0–1.2)
CO2: 24 mmol/L (ref 20–29)
Calcium: 9.5 mg/dL (ref 8.7–10.3)
Chloride: 101 mmol/L (ref 96–106)
Creatinine, Ser: 0.69 mg/dL (ref 0.57–1.00)
Globulin, Total: 2.2 g/dL (ref 1.5–4.5)
Glucose: 80 mg/dL (ref 70–99)
Potassium: 3.9 mmol/L (ref 3.5–5.2)
Sodium: 137 mmol/L (ref 134–144)
Total Protein: 6.5 g/dL (ref 6.0–8.5)
eGFR: 97 mL/min/{1.73_m2} (ref >59)

## 2022-06-06 LAB — CARDIOVASCULAR RISK ASSESSMENT

## 2022-06-06 LAB — TSH: TSH: 1.35 u[IU]/mL (ref 0.450–4.500)

## 2022-06-18 ENCOUNTER — Ambulatory Visit: Payer: BC Managed Care – PPO | Admitting: Physician Assistant

## 2022-06-21 ENCOUNTER — Other Ambulatory Visit: Payer: Self-pay | Admitting: Physician Assistant

## 2022-06-25 ENCOUNTER — Ambulatory Visit: Payer: BC Managed Care – PPO | Admitting: Physician Assistant

## 2022-06-27 ENCOUNTER — Ambulatory Visit: Payer: BC Managed Care – PPO | Admitting: Physician Assistant

## 2022-07-01 DIAGNOSIS — B349 Viral infection, unspecified: Secondary | ICD-10-CM | POA: Diagnosis not present

## 2022-07-01 DIAGNOSIS — J029 Acute pharyngitis, unspecified: Secondary | ICD-10-CM | POA: Diagnosis not present

## 2022-07-05 ENCOUNTER — Ambulatory Visit: Payer: BC Managed Care – PPO | Admitting: Family Medicine

## 2022-07-05 ENCOUNTER — Encounter: Payer: Self-pay | Admitting: Family Medicine

## 2022-07-05 VITALS — BP 130/80 | HR 83 | Temp 97.6°F | Wt 196.0 lb

## 2022-07-05 DIAGNOSIS — J0100 Acute maxillary sinusitis, unspecified: Secondary | ICD-10-CM | POA: Diagnosis not present

## 2022-07-05 DIAGNOSIS — J329 Chronic sinusitis, unspecified: Secondary | ICD-10-CM | POA: Insufficient documentation

## 2022-07-05 MED ORDER — AMOXICILLIN 875 MG PO TABS: 875.0000 mg | ORAL_TABLET | Freq: Two times a day (BID) | ORAL | 0 refills | Status: AC

## 2022-07-05 NOTE — Assessment & Plan Note (Signed)
-   symptoms and exam c/w sinusitis   - no evidence of AOM, CAP, strep pharyngitis, or other infection - given duration of symptoms, suspect bacterial etiology - will treat with amoxicillin x10 d - discussed symptomatic management (flonase, decongestants, etc), natural course, and return precautions

## 2022-07-05 NOTE — Progress Notes (Signed)
Acute Office Visit  Subjective:    Patient ID: Desiree Holmes, female    DOB: 04/26/1961, 61 y.o.   MRN: [id number]  Chief Complaint  Patient presents with   Fatigue   Nasal Congestion   Cough    HPI: Patient is here today with complains of congestion, cough, sore throat and fatigue. Patient denies fever and states that her symptoms has been going on since Friday.. Patient was seen at [facility] on Monday and was told she had a viral information and to take OTC medication. Patient feels her symptoms has got worst. Tested negative for covid and strep.   Patient had tried cold and flu, and Coricidin never has help with her symptoms. Past Medical History:  Diagnosis Date   Barrett's esophagus    H. pylori infection    History of colon polyps     Past Surgical History:  Procedure Laterality Date   ABDOMINAL HYSTERECTOMY     with BSO   CESAREAN SECTION     x2   DIAGNOSTIC LAPAROSCOPY     DILATION AND CURETTAGE OF UTERUS     MINI LAP STERILIZATION     TOTAL ABDOMINAL HYSTERECTOMY W/ BILATERAL SALPINGOOPHORECTOMY     TUBOPLASTY / TUBOTUBAL ANASTOMOSIS      Family History  Problem Relation Age of Onset   COPD Father    Cancer Father        STOMACH   Diabetes Mother    Hypertension Mother    Thyroid disease Mother    Lupus Sister    Diabetes Maternal Grandmother    Heart disease Maternal Grandfather    Heart disease Paternal Grandfather    Cancer Paternal Grandfather        COLON   Breast cancer Neg Hx     Social History   Socioeconomic History   Marital status: Married    Spouse name: Not on file   Number of children: 1   Years of education: Not on file   Highest education level: Not on file  Occupational History   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  Tobacco Use   Smoking status: Never   Smokeless tobacco: Never  Vaping Use   Vaping Use: Never used  Substance and Sexual Activity   Alcohol use: No    Alcohol/week: 0.0 standard drinks of alcohol   Drug use: No    Sexual activity: Not Currently    Birth control/protection: Surgical    Comment: 1st intercourse 58 yo-5 partners  Other Topics Concern   Not on file  Social History Narrative   Not on file   Social Determinants of Health   Financial Resource Strain: Not on file  Food Insecurity: Not on file  Transportation Needs: Not on file  Physical Activity: Not on file  Stress: Not on file  Social Connections: Not on file  Intimate Partner Violence: Not on file    Outpatient Medications Prior to Visit  Medication Sig Dispense Refill   calcium carbonate (OS-CAL) 600 MG TABS Take 600 mg by mouth 2 (two) times daily with a meal.     estradiol (ESTRACE) 1 MG tablet TAKE 1 TABLET(1 MG) BY MOUTH DAILY 90 tablet 1   Loratadine-Pseudoephedrine (EQ ALLERGY RELIEF D 24 HOUR PO) Take by mouth.     losartan (COZAAR) 100 MG tablet TAKE 1 TABLET(100 MG) BY MOUTH DAILY 90 tablet 1   Magnesium 250 MG TABS Take by mouth.     omeprazole (PRILOSEC) 40 MG capsule TAKE 1 CAPSULE BY MOUTH  EVERY DAY 90 capsule 1   phentermine (ADIPEX-P) 37.5 MG tablet TAKE 1 TABLET(37.5 MG) BY MOUTH DAILY BEFORE BREAKFAST 30 tablet 0   vitamin C (ASCORBIC ACID) 500 MG tablet Take 500 mg by mouth daily.     No facility-administered medications prior to visit.    Allergies  Allergen Reactions   Azelastine Itching   Iodinated Contrast Media Other (See Comments)   Sulfa Antibiotics Hives    Review of Systems  Constitutional:  Positive for fatigue.  HENT:  Positive for congestion and sore throat. Negative for ear pain.   Respiratory:  Positive for cough. Negative for shortness of breath.   Cardiovascular:  Negative for chest pain.  Gastrointestinal:  Negative for abdominal pain, constipation, diarrhea, nausea and vomiting.  Genitourinary:  Negative for dysuria, frequency and urgency.  Musculoskeletal:  Negative for arthralgias, back pain and myalgias.  Neurological:  Negative for dizziness and headaches.   Psychiatric/Behavioral:  Negative for agitation and sleep disturbance. The patient is not nervous/anxious.        Objective:    Physical Exam Vitals reviewed.  Constitutional:      Appearance: Normal appearance. She is obese.  HENT:     Right Ear: Tympanic membrane normal.     Left Ear: Tympanic membrane normal.     Nose: No congestion or rhinorrhea.     Mouth/Throat:     Pharynx: Posterior oropharyngeal erythema present. No oropharyngeal exudate.  Cardiovascular:     Rate and Rhythm: Normal rate and regular rhythm.     Heart sounds: Normal heart sounds.  Pulmonary:     Effort: Pulmonary effort is normal.     Breath sounds: Normal breath sounds.  Abdominal:     General: Bowel sounds are normal.     Palpations: Abdomen is soft.     Tenderness: There is no abdominal tenderness.  Lymphadenopathy:     Cervical: No cervical adenopathy.  Neurological:     Mental Status: She is alert and oriented to person, place, and time.  Psychiatric:        Mood and Affect: Mood normal.        Behavior: Behavior normal.     BP 130/80 (BP Location: Left Arm, Patient Position: Sitting, Cuff Size: Large)   Pulse 83   Temp 97.6 F (36.4 C) (Temporal)   Wt 196 lb (88.9 kg)   SpO2 99%   BMI 34.18 kg/m  Wt Readings from Last 3 Encounters:  07/05/22 196 lb (88.9 kg)  06/05/22 195 lb 3.2 oz (88.5 kg)  04/01/22 210 lb 6.4 oz (95.4 kg)    Health Maintenance Due  Topic Date Due   PAP SMEAR-Modifier  09/21/2021   INFLUENZA VACCINE  06/18/2022    There are no preventive care reminders to display for this patient.   Lab Results  Component Value Date   TSH 1.350 06/05/2022   Lab Results  Component Value Date   WBC 7.8 06/05/2022   HGB 13.9 06/05/2022   HCT 40.6 06/05/2022   MCV 87 06/05/2022   PLT 276 06/05/2022   Lab Results  Component Value Date   NA 137 06/05/2022   K 3.9 06/05/2022   CO2 24 06/05/2022   GLUCOSE 80 06/05/2022   BUN 12 06/05/2022   CREATININE 0.69  06/05/2022   BILITOT 0.8 06/05/2022   ALKPHOS 84 06/05/2022   AST 19 06/05/2022   ALT 20 06/05/2022   PROT 6.5 06/05/2022   ALBUMIN 4.3 06/05/2022   CALCIUM 9.5 06/05/2022  EGFR 97 06/05/2022   Lab Results  Component Value Date   CHOL 161 06/05/2022   Lab Results  Component Value Date   HDL 49 06/05/2022   Lab Results  Component Value Date   LDLCALC 94 06/05/2022   Lab Results  Component Value Date   TRIG 98 06/05/2022   Lab Results  Component Value Date   CHOLHDL 3.3 06/05/2022   No results found for: "HGBA1C"     Assessment & Plan:   Problem List Items Addressed This Visit       Respiratory   Sinusitis - Primary    - symptoms and exam c/w sinusitis   - no evidence of AOM, CAP, strep pharyngitis, or other infection - given duration of symptoms, suspect bacterial etiology - will treat with amoxicillin x10 d - discussed symptomatic management (flonase, decongestants, etc), natural course, and return precautions       Relevant Medications   amoxicillin (AMOXIL) 875 MG tablet   Meds ordered this encounter  Medications   amoxicillin (AMOXIL) 875 MG tablet    Sig: Take 1 tablet (875 mg total) by mouth 2 (two) times daily for 10 days.    Dispense:  20 tablet    Refill:  0    No orders of the defined types were placed in this encounter.    Follow-up: No follow-ups on file.  An After Visit Summary was printed and given to the patient.  Rochel Brome, MD [facility] [phone]

## 2022-07-05 NOTE — Patient Instructions (Signed)
-   symptoms and exam c/w sinusitis   - no evidence of AOM, CAP, strep pharyngitis, or other infection - given duration of symptoms, suspect bacterial etiology - will treat with amoxicillin x10 d - discussed symptomatic management (flonase, decongestants, etc), natural course, and return precautions  

## 2022-07-12 ENCOUNTER — Other Ambulatory Visit: Payer: Self-pay | Admitting: Physician Assistant

## 2022-07-12 DIAGNOSIS — Z6834 Body mass index (BMI) 34.0-34.9, adult: Secondary | ICD-10-CM

## 2022-07-16 ENCOUNTER — Telehealth: Payer: Self-pay

## 2022-07-16 MED ORDER — AZITHROMYCIN 250 MG PO TABS: ORAL_TABLET | ORAL | 0 refills | Status: AC

## 2022-07-16 NOTE — Telephone Encounter (Signed)
Patient finished amoxicillin yesterday. She has worsening cough and congestion. Denies chest tightness/pain, fever, body aches, ST.   Has used netty pot and coricidin cold and flu. She is unsure what else she can do. Please advise.   Terrill Mohr 07/16/22 3:47 PM

## 2022-07-16 NOTE — Telephone Encounter (Signed)
Patient made aware. Zpack sent.   Desiree Holmes 07/16/22 4:52 PM

## 2022-07-19 ENCOUNTER — Ambulatory Visit: Payer: BC Managed Care – PPO | Admitting: Physician Assistant

## 2022-07-24 ENCOUNTER — Ambulatory Visit: Payer: BC Managed Care – PPO | Admitting: Physician Assistant

## 2022-08-06 ENCOUNTER — Encounter: Payer: Self-pay | Admitting: Physician Assistant

## 2022-08-06 ENCOUNTER — Ambulatory Visit: Payer: BC Managed Care – PPO | Admitting: Physician Assistant

## 2022-08-06 VITALS — BP 130/78 | HR 90 | Temp 97.8°F | Ht 63.5 in | Wt 194.0 lb

## 2022-08-06 DIAGNOSIS — E669 Obesity, unspecified: Secondary | ICD-10-CM

## 2022-08-06 DIAGNOSIS — J40 Bronchitis, not specified as acute or chronic: Secondary | ICD-10-CM | POA: Insufficient documentation

## 2022-08-06 MED ORDER — PREDNISONE 20 MG PO TABS: ORAL_TABLET | ORAL | 0 refills | Status: AC

## 2022-08-06 MED ORDER — DOXYCYCLINE HYCLATE 100 MG PO TABS: 100.0000 mg | ORAL_TABLET | Freq: Two times a day (BID) | ORAL | 0 refills | Status: DC

## 2022-08-06 NOTE — Progress Notes (Signed)
Acute Office Visit  Subjective:    Patient ID: Desiree Holmes, female    DOB: Mar 12, 1961, 61 y.o.   MRN: [id number]  Chief Complaint  Patient presents with   Samuel [place] Weight gain    HPI: Patient is in today for complaints of persistent cough and congestion for over a month - she was seen at urgent care and treated with antibiotics over a month ago and then again about 2 weeks ago- still with persistent cough and congestion with associated malaise Has not had fever  Pt also in for weight recheck - she has not been taking adipex regularly because she has felt bad for the past month but has lost 2 pounds Is trying to watch diet  Past Medical History:  Diagnosis Date   Barrett's esophagus    H. pylori infection    History of colon polyps     Past Surgical History:  Procedure Laterality Date   ABDOMINAL HYSTERECTOMY     with BSO   CESAREAN SECTION     x2   DIAGNOSTIC LAPAROSCOPY     DILATION AND CURETTAGE OF UTERUS     MINI LAP STERILIZATION     TOTAL ABDOMINAL HYSTERECTOMY W/ BILATERAL SALPINGOOPHORECTOMY     TUBOPLASTY / TUBOTUBAL ANASTOMOSIS      Family History  Problem Relation Age of Onset   COPD Father    Cancer Father        STOMACH   Diabetes Mother    Hypertension Mother    Thyroid disease Mother    Lupus Sister    Diabetes Maternal Grandmother    Heart disease Maternal Grandfather    Heart disease Paternal Grandfather    Cancer Paternal Grandfather        COLON   Breast cancer Neg Hx     Social History   Socioeconomic History   Marital status: Married    Spouse name: Not on file   Number of children: 1   Years of education: Not on file   Highest education level: Not on file  Occupational History   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  Tobacco Use   Smoking status: Never   Smokeless tobacco: Never  Vaping Use   Vaping Use: Never used  Substance and Sexual Activity   Alcohol use: No    Alcohol/week: 0.0 standard drinks of alcohol   Drug use: No    Sexual activity: Not Currently    Birth control/protection: Surgical    Comment: 1st intercourse 1 yo-5 partners  Other Topics Concern   Not on file  Social History Narrative   Not on file   Social Determinants of Health   Financial Resource Strain: Not on file  Food Insecurity: Not on file  Transportation Needs: Not on file  Physical Activity: Not on file  Stress: Not on file  Social Connections: Not on file  Intimate Partner Violence: Not on file    Outpatient Medications Prior to Visit  Medication Sig Dispense Refill   calcium carbonate (OS-CAL) 600 MG TABS Take 600 mg by mouth 2 (two) times daily with a meal.     estradiol (ESTRACE) 1 MG tablet TAKE 1 TABLET(1 MG) BY MOUTH DAILY 90 tablet 1   Loratadine-Pseudoephedrine (EQ ALLERGY RELIEF D 24 HOUR PO) Take by mouth.     losartan (COZAAR) 100 MG tablet TAKE 1 TABLET(100 MG) BY MOUTH DAILY 90 tablet 1   Magnesium 250 MG TABS Take by mouth.     omeprazole (PRILOSEC) 40 MG capsule TAKE  1 CAPSULE BY MOUTH EVERY DAY 90 capsule 1   phentermine (ADIPEX-P) 37.5 MG tablet TAKE 1 TABLET(37.5 MG) BY MOUTH DAILY BEFORE BREAKFAST 30 tablet 0   vitamin C (ASCORBIC ACID) 500 MG tablet Take 500 mg by mouth daily.     No facility-administered medications prior to visit.    Allergies  Allergen Reactions   Azelastine Itching   Iodinated Contrast Media Other (See Comments)   Sulfa Antibiotics Hives    Review of Systems CONSTITUTIONAL: Negative for chills, fatigue, fever,  E/N/T: Negative for ear pain, nasal congestion and sore throat.  CARDIOVASCULAR: Negative for chest pain, dizziness, palpitations and pedal edema.  RESPIRATORY: see HPI GASTROINTESTINAL: Negative for abdominal pain, acid reflux symptoms, constipation, diarrhea, nausea and vomiting.   PSYCHIATRIC: Negative for sleep disturbance and to question depression screen.  Negative for depression, negative for anhedonia.         Objective:   PHYSICAL EXAM:   VS: BP  130/78 (BP Location: Left Arm, Patient Position: Sitting, Cuff Size: Large)   Pulse 90   Temp 97.8 F (36.6 C) (Temporal)   Ht 5' 3.5" (1.613 m)   Wt 194 lb (88 kg)   SpO2 98%   BMI 33.83 kg/m   GEN: Well nourished, well developed, in no acute distress  HEENT: normal external ears and nose - normal external auditory canals and TMS - Oropharynx - normal mucosa, palate, and posterior pharynx  Cardiac: RRR; no murmurs, rubs, or gallops,no edema -  Respiratory:  rhonchi noted - focal in LUL  MS: no deformity or atrophy  Skin: warm and dry, no rash      Health Maintenance Due  Topic Date Due   PAP SMEAR-Modifier  09/21/2021   INFLUENZA VACCINE  06/18/2022    There are no preventive care reminders to display for this patient.   Lab Results  Component Value Date   TSH 1.350 06/05/2022   Lab Results  Component Value Date   WBC 7.8 06/05/2022   HGB 13.9 06/05/2022   HCT 40.6 06/05/2022   MCV 87 06/05/2022   PLT 276 06/05/2022   Lab Results  Component Value Date   NA 137 06/05/2022   K 3.9 06/05/2022   CO2 24 06/05/2022   GLUCOSE 80 06/05/2022   BUN 12 06/05/2022   CREATININE 0.69 06/05/2022   BILITOT 0.8 06/05/2022   ALKPHOS 84 06/05/2022   AST 19 06/05/2022   ALT 20 06/05/2022   PROT 6.5 06/05/2022   ALBUMIN 4.3 06/05/2022   CALCIUM 9.5 06/05/2022   EGFR 97 06/05/2022   Lab Results  Component Value Date   CHOL 161 06/05/2022   Lab Results  Component Value Date   HDL 49 06/05/2022   Lab Results  Component Value Date   LDLCALC 94 06/05/2022   Lab Results  Component Value Date   TRIG 98 06/05/2022   Lab Results  Component Value Date   CHOLHDL 3.3 06/05/2022   No results found for: "HGBA1C"     Assessment & Plan:   Problem List Items Addressed This Visit       Respiratory   Bronchitis - Primary   Relevant Medications   doxycycline (VIBRA-TABS) 100 MG tablet   predniSONE (DELTASONE) 20 MG tablet     Other   Obesity, Class II, BMI  35-39.9 Continue phentermine and watch diet   Meds ordered this encounter  Medications   doxycycline (VIBRA-TABS) 100 MG tablet    Sig: Take 1 tablet (100 mg total) by  mouth 2 (two) times daily.    Dispense:  20 tablet    Refill:  0    Order Specific Question:   Supervising Provider    Answer:   Shelton Silvas   predniSONE (DELTASONE) 20 MG tablet    Sig: Take 3 tablets (60 mg total) by mouth daily with breakfast for 3 days, THEN 2 tablets (40 mg total) daily with breakfast for 3 days, THEN 1 tablet (20 mg total) daily with breakfast for 3 days.    Dispense:  18 tablet    Refill:  0    Order Specific Question:   Supervising Provider    Answer:   Shelton Silvas    No orders of the defined types were placed in this encounter.    Follow-up: Return in about 2 months (around 10/06/2022) for follow up.  An After Visit Summary was printed and given to the patient.  Yetta Flock [facility] [phone]

## 2022-08-08 ENCOUNTER — Telehealth: Payer: Self-pay

## 2022-08-08 NOTE — Telephone Encounter (Signed)
Called patient to notified her the [facility] Desiree Holmes have not read her X-ray. Office will call her as soon as we get it.

## 2022-08-09 ENCOUNTER — Encounter: Payer: Self-pay | Admitting: Physician Assistant

## 2022-08-15 ENCOUNTER — Other Ambulatory Visit: Payer: Self-pay | Admitting: Family Medicine

## 2022-08-15 DIAGNOSIS — Z6834 Body mass index (BMI) 34.0-34.9, adult: Secondary | ICD-10-CM

## 2022-09-19 ENCOUNTER — Other Ambulatory Visit: Payer: Self-pay | Admitting: Physician Assistant

## 2022-09-19 DIAGNOSIS — Z1231 Encounter for screening mammogram for malignant neoplasm of breast: Secondary | ICD-10-CM

## 2022-10-07 ENCOUNTER — Ambulatory Visit: Payer: BC Managed Care – PPO | Admitting: Physician Assistant

## 2022-10-07 ENCOUNTER — Encounter: Payer: Self-pay | Admitting: Physician Assistant

## 2022-10-07 VITALS — BP 122/80 | HR 95 | Temp 97.2°F | Ht 64.0 in | Wt 199.6 lb

## 2022-10-07 DIAGNOSIS — I10 Essential (primary) hypertension: Secondary | ICD-10-CM | POA: Diagnosis not present

## 2022-10-07 DIAGNOSIS — Z23 Encounter for immunization: Secondary | ICD-10-CM | POA: Insufficient documentation

## 2022-10-07 DIAGNOSIS — E669 Obesity, unspecified: Secondary | ICD-10-CM

## 2022-10-07 DIAGNOSIS — Z6834 Body mass index (BMI) 34.0-34.9, adult: Secondary | ICD-10-CM | POA: Diagnosis not present

## 2022-10-07 MED ORDER — PHENTERMINE HCL 37.5 MG PO TABS: ORAL_TABLET | ORAL | 1 refills | Status: DC

## 2022-10-07 NOTE — Progress Notes (Signed)
Subjective:  Patient ID: Desiree Holmes, female    DOB: 05/08/1961  Age: 61 y.o. MRN: [id number]  Chief Complaint  Patient presents with   Gastroesophageal Reflux   Hypertension    HPI  Pt presents for follow up of hypertension. The patient is tolerating the medication well without side effects. Compliance with treatment has been good; including taking medication as directed , maintains a healthy diet and regular exercise regimen , and following up as directed. Currently taking losaratan 100mg  qd   Pt has been taking adipex to help with weight loss - states that she has been taking medication but has not been eating well - going out to eat a lot and eating fast food due to being busy etc but states will start watching food better  Pt would like flu vaccine today Current Outpatient Medications on File Prior to Visit  Medication Sig Dispense Refill   calcium carbonate (OS-CAL) 600 MG TABS Take 600 mg by mouth 2 (two) times daily with a meal.     doxycycline (VIBRA-TABS) 100 MG tablet Take 1 tablet (100 mg total) by mouth 2 (two) times daily. 20 tablet 0   estradiol (ESTRACE) 1 MG tablet TAKE 1 TABLET(1 MG) BY MOUTH DAILY 90 tablet 1   Loratadine-Pseudoephedrine (EQ ALLERGY RELIEF D 24 HOUR PO) Take by mouth.     losartan (COZAAR) 100 MG tablet TAKE 1 TABLET(100 MG) BY MOUTH DAILY 90 tablet 1   Magnesium 250 MG TABS Take by mouth.     omeprazole (PRILOSEC) 40 MG capsule TAKE 1 CAPSULE BY MOUTH EVERY DAY 90 capsule 1   vitamin C (ASCORBIC ACID) 500 MG tablet Take 500 mg by mouth daily.     No current facility-administered medications on file prior to visit.   Past Medical History:  Diagnosis Date   Barrett's esophagus    H. pylori infection    History of colon polyps    Past Surgical History:  Procedure Laterality Date   ABDOMINAL HYSTERECTOMY     with BSO   CESAREAN SECTION     x2   DIAGNOSTIC LAPAROSCOPY     DILATION AND CURETTAGE OF UTERUS     MINI LAP  STERILIZATION     TOTAL ABDOMINAL HYSTERECTOMY W/ BILATERAL SALPINGOOPHORECTOMY     TUBOPLASTY / TUBOTUBAL ANASTOMOSIS      Family History  Problem Relation Age of Onset   COPD Father    Cancer Father        STOMACH   Diabetes Mother    Hypertension Mother    Thyroid disease Mother    Lupus Sister    Diabetes Maternal Grandmother    Heart disease Maternal Grandfather    Heart disease Paternal Grandfather    Cancer Paternal Grandfather        COLON   Breast cancer Neg Hx    Social History   Socioeconomic History   Marital status: Married    Spouse name: Not on file   Number of children: 1   Years of education: Not on file   Highest education level: Not on file  Occupational History   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  Tobacco Use   Smoking status: Never   Smokeless tobacco: Never  Vaping Use   Vaping Use: Never used  Substance and Sexual Activity   Alcohol use: No    Alcohol/week: 0.0 standard drinks of alcohol   Drug use: No   Sexual activity: Not Currently    Birth control/protection: Surgical  Comment: 1st intercourse 29 yo-5 partners  Other Topics Concern   Not on file  Social History Narrative   Not on file   Social Determinants of Health   Financial Resource Strain: Not on file  Food Insecurity: Not on file  Transportation Needs: Not on file  Physical Activity: Not on file  Stress: Not on file  Social Connections: Not on file    Review of Systems  CONSTITUTIONAL: Negative for chills, fatigue, fever,  CARDIOVASCULAR: Negative for chest pain, dizziness, palpitations and pedal edema.  RESPIRATORY: Negative for recent cough and dyspnea.  NEUROLOGICAL: Negative for dizziness and headaches.       Objective:  PHYSICAL EXAM:   VS: BP 122/80   Pulse 95   Temp (!) 97.2 F (36.2 C)   Ht 5\' 4"  (1.626 m)   Wt 199 lb 9.6 oz (90.5 kg)   SpO2 100%   BMI 34.26 kg/m   GEN: Well nourished, well developed, in no acute distress  Cardiac: RRR; no  murmurs, Respiratory:  normal respiratory rate and pattern with no distress - normal breath sounds with no rales, rhonchi, wheezes or rubs Skin: warm and dry, no rash  Psych: euthymic mood, appropriate affect and demeanor   Lab Results  Component Value Date   WBC 7.8 06/05/2022   HGB 13.9 06/05/2022   HCT 40.6 06/05/2022   PLT 276 06/05/2022   GLUCOSE 80 06/05/2022   CHOL 161 06/05/2022   TRIG 98 06/05/2022   HDL 49 06/05/2022   LDLCALC 94 06/05/2022   ALT 20 06/05/2022   AST 19 06/05/2022   NA 137 06/05/2022   K 3.9 06/05/2022   CL 101 06/05/2022   CREATININE 0.69 06/05/2022   BUN 12 06/05/2022   CO2 24 06/05/2022   TSH 1.350 06/05/2022      Assessment & Plan:   Problem List Items Addressed This Visit       Cardiovascular and Mediastinum   Benign hypertension - Primary Continue current meds     Other   Obesity, Class II, BMI 35-39.9   Needs flu shot   Relevant Orders   Flu Vaccine MDCK QUAD PF   Other Visit Diagnoses     BMI 34.0-34.9,adult       Relevant Medications   phentermine (ADIPEX-P) 37.5 MG tablet     .  Meds ordered this encounter  Medications   phentermine (ADIPEX-P) 37.5 MG tablet    Sig: 1 po qd    Dispense:  30 tablet    Refill:  1    Order Specific Question:   Supervising Provider    [id number]    Orders Placed This Encounter  Procedures   Flu Vaccine MDCK QUAD PF     Follow-up: Return in about 4 months (around 02/05/2023) for chronic fasting follow up.  An After Visit Summary was printed and given to the patient.  02/07/2023 [facility] [phone]

## 2022-10-18 ENCOUNTER — Telehealth: Payer: Self-pay

## 2022-10-18 NOTE — Telephone Encounter (Signed)
Patient called and stated she is having sinus symptoms and coughing up yellow mucus and wanted to know if provider could send her something in for it. Please advise

## 2022-10-18 NOTE — Telephone Encounter (Signed)
Patient Made Aware, Verbalized Understanding.  

## 2022-10-18 NOTE — Telephone Encounter (Signed)
Recommend otc allergy meds/delsym for cough --- if starts having malaise/fever would recommend office visit

## 2022-10-27 DIAGNOSIS — J111 Influenza due to unidentified influenza virus with other respiratory manifestations: Secondary | ICD-10-CM | POA: Diagnosis not present

## 2022-10-27 DIAGNOSIS — R110 Nausea: Secondary | ICD-10-CM | POA: Diagnosis not present

## 2022-10-27 DIAGNOSIS — R0981 Nasal congestion: Secondary | ICD-10-CM | POA: Diagnosis not present

## 2022-11-07 ENCOUNTER — Other Ambulatory Visit: Payer: Self-pay | Admitting: Family Medicine

## 2022-11-07 ENCOUNTER — Other Ambulatory Visit: Payer: Self-pay | Admitting: Physician Assistant

## 2022-11-19 ENCOUNTER — Ambulatory Visit
Admission: RE | Admit: 2022-11-19 | Discharge: 2022-11-19 | Disposition: A | Discharge status: Home / Self Care | Payer: BC Managed Care – PPO | Source: Ambulatory Visit | Attending: Physician Assistant | Admitting: Physician Assistant

## 2022-11-19 ENCOUNTER — Encounter: Payer: Self-pay | Admitting: Nurse Practitioner

## 2022-11-19 ENCOUNTER — Ambulatory Visit (INDEPENDENT_AMBULATORY_CARE_PROVIDER_SITE_OTHER): Payer: BC Managed Care – PPO | Admitting: Nurse Practitioner

## 2022-11-19 VITALS — BP 122/76 | HR 98 | Ht 62.0 in | Wt 206.0 lb

## 2022-11-19 DIAGNOSIS — Z1231 Encounter for screening mammogram for malignant neoplasm of breast: Secondary | ICD-10-CM | POA: Diagnosis not present

## 2022-11-19 DIAGNOSIS — Z780 Asymptomatic menopausal state: Secondary | ICD-10-CM

## 2022-11-19 DIAGNOSIS — Z79890 Hormone replacement therapy (postmenopausal): Secondary | ICD-10-CM | POA: Diagnosis not present

## 2022-11-19 DIAGNOSIS — Z01419 Encounter for gynecological examination (general) (routine) without abnormal findings: Secondary | ICD-10-CM | POA: Diagnosis not present

## 2022-11-19 MED ORDER — ESTRADIOL 1 MG PO TABS: ORAL_TABLET | ORAL | 3 refills | Status: DC

## 2022-11-19 NOTE — Progress Notes (Signed)
   Desiree Holmes [id number] [birth year] [id number]   History:  62 y.o. [id number] presents for annual exam. Postmenopausal - on ERT. S/P 2004 TAH BSO for menorrhagia and ovarian cysts. Normal pap and mammogram history. History of Barrett's esophagus. Has not "mole" on vulvar.   Gynecologic History No LMP recorded. Patient has had a hysterectomy.   Contraception/Family planning: status post hysterectomy Sexually active: Yes  Health Maintenance Last Pap: 09/21/2018. Results were: Normal Last mammogram: 10/04/2021. Results were: Normal Last colonoscopy: 12/15/2020. Results were: Benign polyps, 5-year recall Last Dexa: 12/25/2021. Results were: Normal  Past medical history, past surgical history, family history and social history were all reviewed and documented in the EPIC chart. Married. 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 16 yo daughter, married.   ROS:  A ROS was performed and pertinent positives and negatives are included.  Exam:  Vitals:   11/19/22 1524  BP: 122/76  Pulse: 98  SpO2: 99%  Weight: 206 lb (93.4 kg)  Height: 5\' 2"  (1.575 m)   Body mass index is 37.68 kg/m.  General appearance:  Normal Thyroid:  Symmetrical, normal in size, without palpable masses or nodularity. Respiratory  Auscultation:  Clear without wheezing or rhonchi Cardiovascular  Auscultation:  Regular rate, without rubs, murmurs or gallops  Edema/varicosities:  Not grossly evident Abdominal  Soft,nontender, without masses, guarding or rebound.  Liver/spleen:  No organomegaly noted  Hernia:  None appreciated  Skin  Inspection:  Grossly normal Breasts: Examined lying and sitting.   Right: Without masses, retractions, nipple discharge or axillary adenopathy.   Left: Without masses, retractions, nipple discharge or axillary adenopathy. Genitourinary   Inguinal/mons:  Normal without inguinal adenopathy  External genitalia:  Normal appearing vulva with no masses, tenderness, or lesions. 1 mm  skin tag on left labia majora  BUS/Urethra/Skene's glands:  Normal  Vagina:  Normal appearing with normal color and discharge, no lesions  Cervix:  Absent  Uterus:  Absent  Adnexa/parametria:     Rt: Normal in size, without masses or tenderness.   Lt: Normal in size, without masses or tenderness.  Anus and perineum: Normal  Digital rectal exam: Normal sphincter tone without palpated masses or tenderness  Patient informed chaperone available to be present for breast and pelvic exam. Patient has requested no chaperone to be present. Patient has been advised what will be completed during breast and pelvic exam.   Assessment/Plan:  62 y.o. [id number] for annual exam.   Well female exam with routine gynecological exam - Education provided on SBEs, importance of preventative screenings, current guidelines, high calcium diet, regular exercise, and multivitamin daily.  Labs with PCP.   Postmenopausal - On ERT. S/P 2004 TAH BSO for menorrhagia and ovarian cysts.   Hormone replacement therapy - Plan: estradiol (ESTRACE) 1 MG tablet daily. She is aware of risks of blood clots, heart attack, stroke, and breast cancer. We did discuss trying to wean and she is agreeable. Refill x 1 year provided.   Screening for cervical cancer - Normal Pap history. No longer screening per guidelines.   Screening for breast cancer - Normal mammogram history.  Continue annual screenings.  Normal breast exam today. Mammogram scheduled today.   Screening for colon cancer - 11/2020 colonoscopy. Will repeat at GI's recommended interval.   Screening for osteoporosis - Normal bone density 12/2021. Will repeat at 5-year interval.   Return in 1 year for annual.     Tamela Gammon DNP, 3:44 PM 11/19/2022

## 2022-11-28 ENCOUNTER — Other Ambulatory Visit: Payer: Self-pay | Admitting: Family Medicine

## 2023-01-20 DIAGNOSIS — M791 Myalgia, unspecified site: Secondary | ICD-10-CM | POA: Diagnosis not present

## 2023-01-20 DIAGNOSIS — R051 Acute cough: Secondary | ICD-10-CM | POA: Diagnosis not present

## 2023-01-29 DIAGNOSIS — J209 Acute bronchitis, unspecified: Secondary | ICD-10-CM | POA: Diagnosis not present

## 2023-02-04 ENCOUNTER — Encounter: Payer: Self-pay | Admitting: Physician Assistant

## 2023-02-04 ENCOUNTER — Ambulatory Visit: Payer: BC Managed Care – PPO | Admitting: Physician Assistant

## 2023-02-04 VITALS — BP 118/82 | HR 94 | Temp 95.7°F | Ht 64.0 in | Wt 205.0 lb

## 2023-02-04 DIAGNOSIS — I10 Essential (primary) hypertension: Secondary | ICD-10-CM | POA: Diagnosis not present

## 2023-02-04 DIAGNOSIS — K219 Gastro-esophageal reflux disease without esophagitis: Secondary | ICD-10-CM | POA: Diagnosis not present

## 2023-02-04 DIAGNOSIS — Z6834 Body mass index (BMI) 34.0-34.9, adult: Secondary | ICD-10-CM | POA: Diagnosis not present

## 2023-02-04 DIAGNOSIS — E782 Mixed hyperlipidemia: Secondary | ICD-10-CM | POA: Diagnosis not present

## 2023-02-04 MED ORDER — PHENTERMINE HCL 37.5 MG PO TABS: ORAL_TABLET | ORAL | 1 refills | Status: DC

## 2023-02-04 NOTE — Progress Notes (Signed)
Subjective:  Patient ID: Desiree Holmes, female    DOB: 25-Aug-1961  Age: 62 y.o. MRN: [id number]  Chief Complaint  Patient presents with   Hypertension    Hypertension    Pt presents for follow up of hypertension. The patient is tolerating the medication well without side effects. Compliance with treatment has been good; including taking medication as directed , maintains a healthy diet and regular exercise regimen , and following up as directed. Currently taking losaratan 100mg  qd   Pt with history of GERD - currently on prilosec 40mg  qd - does not take daily  Has been on adipex for weight loss in past and would like to restart Current Outpatient Medications on File Prior to Visit  Medication Sig Dispense Refill   Calcium Carb-Cholecalciferol (SM CALCIUM 500/VITAMIN D3 PO) Take by mouth.     cetirizine (ZYRTEC) 10 MG tablet Take 10 mg by mouth daily.     estradiol (ESTRACE) 1 MG tablet TAKE 1 TABLET(1 MG) BY MOUTH DAILY 90 tablet 3   losartan (COZAAR) 100 MG tablet TAKE 1 TABLET(100 MG) BY MOUTH DAILY 90 tablet 0   Omega-3 Fatty Acids (FISH OIL) 1000 MG CAPS Take by mouth.     omeprazole (PRILOSEC) 40 MG capsule TAKE 1 CAPSULE BY MOUTH EVERY DAY 90 capsule 1   vitamin C (ASCORBIC ACID) 500 MG tablet Take 500 mg by mouth daily.     No current facility-administered medications on file prior to visit.   Past Medical History:  Diagnosis Date   Barrett's esophagus    H. pylori infection    History of colon polyps    Past Surgical History:  Procedure Laterality Date   ABDOMINAL HYSTERECTOMY     with BSO   CESAREAN SECTION     x2   DIAGNOSTIC LAPAROSCOPY     DILATION AND CURETTAGE OF UTERUS     MINI LAP STERILIZATION     TOTAL ABDOMINAL HYSTERECTOMY W/ BILATERAL SALPINGOOPHORECTOMY     TUBOPLASTY / TUBOTUBAL ANASTOMOSIS      Family History  Problem Relation Age of Onset   COPD Father    Cancer Father        STOMACH   Diabetes Mother    Hypertension Mother     Thyroid disease Mother    Lupus Sister    Diabetes Maternal Grandmother    Heart disease Maternal Grandfather    Heart disease Paternal Grandfather    Cancer Paternal Grandfather        COLON   Breast cancer Neg Hx    Social History   Socioeconomic History   Marital status: Married    Spouse name: Not on file   Number of children: 1   Years of education: Not on file   Highest education level: Not on file  Occupational History   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  Tobacco Use   Smoking status: Never   Smokeless tobacco: Never  Vaping Use   Vaping Use: Never used  Substance and Sexual Activity   Alcohol use: No    Alcohol/week: 0.0 standard drinks of alcohol   Drug use: No   Sexual activity: Not Currently    Birth control/protection: Surgical    Comment: 1st intercourse 53 yo-5 partners  Other Topics Concern   Not on file  Social History Narrative   Not on file   Social Determinants of Health   Financial Resource Strain: Not on file  Food Insecurity: Not on file  Transportation Needs: Not on file  Physical Activity: Not on file  Stress: Not on file  Social Connections: Not on file   CONSTITUTIONAL: Negative for chills, fatigue, fever,  CARDIOVASCULAR: Negative for chest pain, dizziness,  RESPIRATORY: Negative for recent cough and dyspnea.  GASTROINTESTINAL: Negative for abdominal pain, acid reflux symptoms, constipation, diarrhea, nausea and vomiting.   PSYCHIATRIC: Negative for sleep disturbance and to question depression screen.  Negative for depression, negative for anhedonia.       Objective:  PHYSICAL EXAM:   VS: BP 118/82 (BP Location: Left Arm, Patient Position: Sitting, Cuff Size: Large)   Pulse 94   Temp (!) 95.7 F (35.4 C) (Temporal)   Ht 5\' 4"  (1.626 m)   Wt 205 lb (93 kg)   SpO2 99%   BMI 35.19 kg/m   GEN: Well nourished, well developed, in no acute distress  Cardiac: RRR; no murmurs,  Respiratory:  normal respiratory rate and pattern with no  distress - normal breath sounds with no rales, rhonchi, wheezes or rubs Skin: warm and dry, no rash  Psych: euthymic mood, appropriate affect and demeanor   Lab Results  Component Value Date   WBC 7.8 06/05/2022   HGB 13.9 06/05/2022   HCT 40.6 06/05/2022   PLT 276 06/05/2022   GLUCOSE 80 06/05/2022   CHOL 161 06/05/2022   TRIG 98 06/05/2022   HDL 49 06/05/2022   LDLCALC 94 06/05/2022   ALT 20 06/05/2022   AST 19 06/05/2022   NA 137 06/05/2022   K 3.9 06/05/2022   CL 101 06/05/2022   CREATININE 0.69 06/05/2022   BUN 12 06/05/2022   CO2 24 06/05/2022   TSH 1.350 06/05/2022      Assessment & Plan:   Problem List Items Addressed This Visit       Cardiovascular and Mediastinum   Benign hypertension - Primary Continue current meds     Other   GERD Continue prilosec as directed            Other Visit Diagnoses     BMI 34.0-34.9,adult       Relevant Medications   phentermine (ADIPEX-P) 37.5 MG tablet Watch diet Increase physical activity     .  Meds ordered this encounter  Medications   phentermine (ADIPEX-P) 37.5 MG tablet    Sig: 1 po qd    Dispense:  30 tablet    Refill:  1    Order Specific Question:   Supervising Provider    AnswerShelton Silvas    Orders Placed This Encounter  Procedures   CBC with Differential/Platelet   Comprehensive metabolic panel   Lipid panel   TSH     Follow-up: Return in about 2 months (around 04/06/2023) for follow up.  An After Visit Summary was printed and given to the patient.  Yetta Flock [facility] [phone]

## 2023-02-05 ENCOUNTER — Other Ambulatory Visit: Payer: Self-pay | Admitting: Physician Assistant

## 2023-02-05 DIAGNOSIS — R899 Unspecified abnormal finding in specimens from other organs, systems and tissues: Secondary | ICD-10-CM

## 2023-02-05 LAB — CBC WITH DIFFERENTIAL/PLATELET
Basophils Absolute: 0 10*3/uL (ref 0.0–0.2)
Basos: 0 %
EOS (ABSOLUTE): 0 10*3/uL (ref 0.0–0.4)
Eos: 0 %
Hematocrit: 42.6 % (ref 34.0–46.6)
Hemoglobin: 13.9 g/dL (ref 11.1–15.9)
Immature Grans (Abs): 0.1 10*3/uL (ref 0.0–0.1)
Immature Granulocytes: 1 %
Lymphocytes Absolute: 4 10*3/uL — ABNORMAL HIGH (ref 0.7–3.1)
Lymphs: 23 %
MCH: 28.8 pg (ref 26.6–33.0)
MCHC: 32.6 g/dL (ref 31.5–35.7)
MCV: 88 fL (ref 79–97)
Monocytes Absolute: 0.8 10*3/uL (ref 0.1–0.9)
Monocytes: 4 %
Neutrophils Absolute: 12.1 10*3/uL — ABNORMAL HIGH (ref 1.4–7.0)
Neutrophils: 72 %
Platelets: 386 10*3/uL (ref 150–450)
RBC: 4.82 x10E6/uL (ref 3.77–5.28)
RDW: 13.2 % (ref 11.7–15.4)
WBC: 16.9 10*3/uL — ABNORMAL HIGH (ref 3.4–10.8)

## 2023-02-05 LAB — COMPREHENSIVE METABOLIC PANEL
ALT: 36 IU/L — ABNORMAL HIGH (ref 0–32)
AST: 14 IU/L (ref 0–40)
Albumin/Globulin Ratio: 1.8 (ref 1.2–2.2)
Albumin: 4.4 g/dL (ref 3.9–4.9)
Alkaline Phosphatase: 102 IU/L (ref 44–121)
BUN/Creatinine Ratio: 21 (ref 12–28)
BUN: 14 mg/dL (ref 8–27)
Bilirubin Total: 0.8 mg/dL (ref 0.0–1.2)
CO2: 21 mmol/L (ref 20–29)
Calcium: 9.5 mg/dL (ref 8.7–10.3)
Chloride: 100 mmol/L (ref 96–106)
Creatinine, Ser: 0.67 mg/dL (ref 0.57–1.00)
Globulin, Total: 2.4 g/dL (ref 1.5–4.5)
Glucose: 78 mg/dL (ref 70–99)
Potassium: 4.3 mmol/L (ref 3.5–5.2)
Sodium: 140 mmol/L (ref 134–144)
Total Protein: 6.8 g/dL (ref 6.0–8.5)
eGFR: 98 mL/min/{1.73_m2} (ref >59)

## 2023-02-05 LAB — LIPID PANEL
Chol/HDL Ratio: 3.1 ratio (ref 0.0–4.4)
Cholesterol, Total: 183 mg/dL (ref 100–199)
HDL: 60 mg/dL (ref >39)
LDL Chol Calc (NIH): 90 mg/dL (ref 0–99)
Triglycerides: 194 mg/dL — ABNORMAL HIGH (ref 0–149)
VLDL Cholesterol Cal: 33 mg/dL (ref 5–40)

## 2023-02-05 LAB — CARDIOVASCULAR RISK ASSESSMENT

## 2023-02-05 LAB — TSH: TSH: 0.678 u[IU]/mL (ref 0.450–4.500)

## 2023-02-10 ENCOUNTER — Ambulatory Visit: Payer: BC Managed Care – PPO

## 2023-02-12 ENCOUNTER — Ambulatory Visit: Payer: BC Managed Care – PPO

## 2023-02-12 DIAGNOSIS — R899 Unspecified abnormal finding in specimens from other organs, systems and tissues: Secondary | ICD-10-CM

## 2023-02-12 LAB — CBC WITH DIFFERENTIAL/PLATELET
Basophils Absolute: 0.1 10*3/uL (ref 0.0–0.2)
Basos: 0 %
EOS (ABSOLUTE): 0.1 10*3/uL (ref 0.0–0.4)
Eos: 1 %
Hematocrit: 41.4 % (ref 34.0–46.6)
Hemoglobin: 14 g/dL (ref 11.1–15.9)
Immature Grans (Abs): 0 10*3/uL (ref 0.0–0.1)
Immature Granulocytes: 0 %
Lymphocytes Absolute: 3.6 10*3/uL — ABNORMAL HIGH (ref 0.7–3.1)
Lymphs: 30 %
MCH: 30 pg (ref 26.6–33.0)
MCHC: 33.8 g/dL (ref 31.5–35.7)
MCV: 89 fL (ref 79–97)
Monocytes Absolute: 0.7 10*3/uL (ref 0.1–0.9)
Monocytes: 6 %
Neutrophils Absolute: 7.5 10*3/uL — ABNORMAL HIGH (ref 1.4–7.0)
Neutrophils: 63 %
Platelets: 322 10*3/uL (ref 150–450)
RBC: 4.67 x10E6/uL (ref 3.77–5.28)
RDW: 13.2 % (ref 11.7–15.4)
WBC: 12 10*3/uL — ABNORMAL HIGH (ref 3.4–10.8)

## 2023-03-06 ENCOUNTER — Other Ambulatory Visit: Payer: Self-pay | Admitting: Family Medicine

## 2023-04-09 ENCOUNTER — Ambulatory Visit: Payer: BC Managed Care – PPO | Admitting: Physician Assistant

## 2023-04-09 ENCOUNTER — Encounter: Payer: Self-pay | Admitting: Physician Assistant

## 2023-04-09 VITALS — BP 124/80 | HR 91 | Temp 96.9°F | Ht 63.0 in | Wt 194.0 lb

## 2023-04-09 DIAGNOSIS — I10 Essential (primary) hypertension: Secondary | ICD-10-CM | POA: Diagnosis not present

## 2023-04-09 DIAGNOSIS — Z6834 Body mass index (BMI) 34.0-34.9, adult: Secondary | ICD-10-CM

## 2023-04-09 MED ORDER — PHENTERMINE HCL 37.5 MG PO TABS: ORAL_TABLET | ORAL | 1 refills | Status: DC

## 2023-04-09 NOTE — Progress Notes (Signed)
Subjective:  Patient ID: Desiree Holmes, female    DOB: 01-Feb-1961  Age: 62 y.o. MRN: [id number]  Chief Complaint  Patient presents with   Hypertension   Gastroesophageal Reflux   Weight Management Screening    HPI Pt presents for follow up of hypertension.  The patient is tolerating the medication well without side effects. Compliance with treatment has been good; including taking medication as directed , maintains a healthy diet and regular exercise regimen , and following up as directed. Is currently taking losartan 100mg  qd  Pt in for follow up of weight loss management - she is doing well on adipex qd -  has lost almost 10 pounds since last visit - would like to continue med -         04/09/2023    3:42 PM 02/04/2023    8:16 AM 04/01/2022   10:13 AM 12/17/2021    9:22 AM 07/18/2021    9:44 AM  Depression screen PHQ 2/9  Decreased Interest 0 0 0 0 0  Down, Depressed, Hopeless 1 0 0 0 0  PHQ - 2 Score 1 0 0 0 0  Altered sleeping 2      Tired, decreased energy 1      Change in appetite 1      Feeling bad or failure about yourself  1      Trouble concentrating 0      Moving slowly or fidgety/restless 0      Suicidal thoughts 0      PHQ-9 Score 6      Difficult doing work/chores Not difficult at all            07/18/2021    9:44 AM 12/17/2021    9:22 AM 04/01/2022   10:13 AM 02/04/2023    8:16 AM 04/09/2023    3:42 PM  Fall Risk  Falls in the past year? 0 0 0 0 0  Was there an injury with Fall? 0 0 0 0 0  Fall Risk Category Calculator 0 0 0 0 0  Fall Risk Category (Retired) Low Low Low    (RETIRED) Patient Fall Risk Level Low fall risk Low fall risk Low fall risk    Patient at Risk for Falls Due to    No Fall Risks No Fall Risks  Fall risk Follow up    Falls evaluation completed Falls evaluation completed     ROS CONSTITUTIONAL: Negative for chills, fatigue, fever,  CARDIOVASCULAR: Negative for chest pain, dizziness, palpitations and pedal edema.   RESPIRATORY: Negative for recent cough and dyspnea.  GASTROINTESTINAL: Negative for abdominal pain, acid reflux symptoms, constipation, diarrhea, nausea and vomiting.     Current Outpatient Medications:    Calcium Carb-Cholecalciferol (SM CALCIUM 500/VITAMIN D3 PO), Take by mouth., Disp: , Rfl:    cetirizine (ZYRTEC) 10 MG tablet, Take 10 mg by mouth daily., Disp: , Rfl:    estradiol (ESTRACE) 1 MG tablet, TAKE 1 TABLET(1 MG) BY MOUTH DAILY, Disp: 90 tablet, Rfl: 3   losartan (COZAAR) 100 MG tablet, TAKE 1 TABLET(100 MG) BY MOUTH DAILY, Disp: 90 tablet, Rfl: 0   Omega-3 Fatty Acids (FISH OIL) 1000 MG CAPS, Take by mouth., Disp: , Rfl:    omeprazole (PRILOSEC) 40 MG capsule, TAKE 1 CAPSULE BY MOUTH EVERY DAY, Disp: 90 capsule, Rfl: 1   phentermine (ADIPEX-P) 37.5 MG tablet, 1 po qd, Disp: 30 tablet, Rfl: 1   vitamin C (ASCORBIC ACID) 500 MG tablet, Take 500 mg by  mouth daily., Disp: , Rfl:   Past Medical History:  Diagnosis Date   Barrett's esophagus    H. pylori infection    History of colon polyps    Objective:  PHYSICAL EXAM:   BP 124/80   Pulse 91   Temp (!) 96.9 F (36.1 C)   Ht 5\' 3"  (1.6 m)   Wt 194 lb (88 kg)   SpO2 92%   BMI 34.37 kg/m    GEN: Well nourished, well developed, in no acute distress  Cardiac: RRR; no murmurs, rubs, or gallops,no edema -  Respiratory:  normal respiratory rate and pattern with no distress - normal breath sounds with no rales, rhonchi, wheezes or rubs   Assessment & Plan:    Benign hypertension Continue med as directed BMI 34.0-34.9,adult -     Phentermine HCl; 1 po qd  Dispense: 30 tablet; Refill: 1 Watch diet     Follow-up: Return in about 2 months (around 06/09/2023) for follow-up.  An After Visit Summary was printed and given to the patient.  Jettie Pagan [facility] [phone]

## 2023-05-30 ENCOUNTER — Other Ambulatory Visit: Payer: Self-pay | Admitting: Physician Assistant

## 2023-06-10 ENCOUNTER — Ambulatory Visit: Payer: BC Managed Care – PPO | Admitting: Physician Assistant

## 2023-06-10 ENCOUNTER — Encounter: Payer: Self-pay | Admitting: Physician Assistant

## 2023-06-10 VITALS — BP 128/84 | HR 97 | Temp 97.2°F | Ht 63.0 in | Wt 192.0 lb

## 2023-06-10 DIAGNOSIS — Z6834 Body mass index (BMI) 34.0-34.9, adult: Secondary | ICD-10-CM

## 2023-06-10 DIAGNOSIS — E669 Obesity, unspecified: Secondary | ICD-10-CM

## 2023-06-10 MED ORDER — PHENTERMINE HCL 37.5 MG PO TABS: ORAL_TABLET | ORAL | 1 refills | Status: DC

## 2023-06-10 NOTE — Progress Notes (Signed)
Subjective:  Patient ID: Desiree Holmes, female    DOB: 06-01-1961  Age: 62 y.o. MRN: [id number]  Chief Complaint  Patient presents with   Weight Gain    HPI Pt in today for follow up of weight gain.. - she has lost 2 pounds since last visit. Does not feel like it is working as well as it had in the past.  Has tried watching diet -  Would like to continue medication at this time     06/10/2023    2:14 PM 04/09/2023    3:42 PM 02/04/2023    8:16 AM 04/01/2022   10:13 AM 12/17/2021    9:22 AM  Depression screen PHQ 2/9  Decreased Interest 0 0 0 0 0  Down, Depressed, Hopeless 0 1 0 0 0  PHQ - 2 Score 0 1 0 0 0  Altered sleeping 2 2     Tired, decreased energy 1 1     Change in appetite 1 1     Feeling bad or failure about yourself  1 1     Trouble concentrating 0 0     Moving slowly or fidgety/restless 0 0     Suicidal thoughts 0 0     PHQ-9 Score 5 6     Difficult doing work/chores Not difficult at all Not difficult at all           12/17/2021    9:22 AM 04/01/2022   10:13 AM 02/04/2023    8:16 AM 04/09/2023    3:42 PM 06/10/2023    2:14 PM  Fall Risk  Falls in the past year? 0 0 0 0 0  Was there an injury with Fall? 0 0 0 0 0  Fall Risk Category Calculator 0 0 0 0 0  Fall Risk Category (Retired) Low Low     (RETIRED) Patient Fall Risk Level Low fall risk Low fall risk     Patient at Risk for Falls Due to   No Fall Risks No Fall Risks No Fall Risks  Fall risk Follow up   Falls evaluation completed Falls evaluation completed Falls evaluation completed     ROS CONSTITUTIONAL: Negative for chills, fatigue, fever,  CARDIOVASCULAR: Negative for chest pain, dizziness, palpitations  RESPIRATORY: Negative for recent cough and dyspnea.   PSYCHIATRIC: Negative for sleep disturbance and to question depression screen.  Negative for depression, negative for anhedonia.    Current Outpatient Medications:    Calcium Carb-Cholecalciferol (SM CALCIUM 500/VITAMIN D3 PO), Take  by mouth., Disp: , Rfl:    cetirizine (ZYRTEC) 10 MG tablet, Take 10 mg by mouth daily., Disp: , Rfl:    estradiol (ESTRACE) 1 MG tablet, TAKE 1 TABLET(1 MG) BY MOUTH DAILY, Disp: 90 tablet, Rfl: 3   losartan (COZAAR) 100 MG tablet, TAKE 1 TABLET(100 MG) BY MOUTH DAILY, Disp: 90 tablet, Rfl: 0   Omega-3 Fatty Acids (FISH OIL) 1000 MG CAPS, Take by mouth., Disp: , Rfl:    omeprazole (PRILOSEC) 40 MG capsule, TAKE 1 CAPSULE BY MOUTH EVERY DAY, Disp: 90 capsule, Rfl: 1   vitamin C (ASCORBIC ACID) 500 MG tablet, Take 500 mg by mouth daily., Disp: , Rfl:    phentermine (ADIPEX-P) 37.5 MG tablet, 1 po qd, Disp: 30 tablet, Rfl: 1  Past Medical History:  Diagnosis Date   Barrett's esophagus    H. pylori infection    History of colon polyps    Objective:  PHYSICAL EXAM:   BP 128/84 (BP Location:  Left Arm, Patient Position: Sitting, Cuff Size: Large)   Pulse 97   Temp (!) 97.2 F (36.2 C) (Temporal)   Ht 5\' 3"  (1.6 m)   Wt 192 lb (87.1 kg)   SpO2 98%   BMI 34.01 kg/m    GEN: Well nourished, well developed, in no acute distress  Cardiac: RRR; no murmurs, Respiratory:  normal respiratory rate and pattern with no distress - normal breath sounds with no rales, rhonchi, wheezes or rubs Psych: euthymic mood, appropriate affect and demeanor  Assessment & Plan:    Obesity, Class II, BMI 35-39.9 Watch diet - increase water intake BMI 34.0-34.9,adult -     Phentermine HCl; 1 po qd  Dispense: 30 tablet; Refill: 1     Follow-up: Return in about 4 months (around 10/11/2023) for chronic fasting follow-up.  An After Visit Summary was printed and given to the patient.  Jettie Pagan [facility] [phone]

## 2023-07-24 DIAGNOSIS — R519 Headache, unspecified: Secondary | ICD-10-CM | POA: Diagnosis not present

## 2023-07-24 DIAGNOSIS — R070 Pain in throat: Secondary | ICD-10-CM | POA: Diagnosis not present

## 2023-07-24 DIAGNOSIS — J0100 Acute maxillary sinusitis, unspecified: Secondary | ICD-10-CM | POA: Diagnosis not present

## 2023-09-30 ENCOUNTER — Other Ambulatory Visit: Payer: Self-pay | Admitting: Nurse Practitioner

## 2023-09-30 DIAGNOSIS — Z1231 Encounter for screening mammogram for malignant neoplasm of breast: Secondary | ICD-10-CM

## 2023-10-06 DIAGNOSIS — R0981 Nasal congestion: Secondary | ICD-10-CM | POA: Diagnosis not present

## 2023-10-14 ENCOUNTER — Ambulatory Visit: Payer: BC Managed Care – PPO | Admitting: Physician Assistant

## 2023-10-21 ENCOUNTER — Ambulatory Visit: Payer: BC Managed Care – PPO | Admitting: Podiatry

## 2023-10-27 ENCOUNTER — Ambulatory Visit: Payer: BC Managed Care – PPO | Admitting: Podiatry

## 2023-11-17 ENCOUNTER — Encounter: Payer: Self-pay | Admitting: Podiatry

## 2023-11-17 ENCOUNTER — Ambulatory Visit: Payer: BC Managed Care – PPO | Admitting: Podiatry

## 2023-11-17 DIAGNOSIS — B351 Tinea unguium: Secondary | ICD-10-CM

## 2023-11-17 DIAGNOSIS — L603 Nail dystrophy: Secondary | ICD-10-CM | POA: Diagnosis not present

## 2023-11-17 NOTE — Progress Notes (Signed)
       Chief Complaint  Patient presents with   Nail Problem    NP, has had trauma to her toes (30 years ago), nails look fungal. No pain, brittle.     [name] presents today with dystrophic toenails.  She has concern for nail fungus.  Bilateral great toenails are most affected.  Does report some changes in lesser toenails as well.  States that she has had abnormal nails for her entire adult life states that she dropped a Desiree Holmes on the toes at age 11 and has had some abnormality since then.  Did previously take course of terbinafine from another provider years ago which did improve the nail somewhat.  Past Medical History:  Diagnosis Date   Barrett's esophagus    H. pylori infection    History of colon polyps     Past Surgical History:  Procedure Laterality Date   ABDOMINAL HYSTERECTOMY     with BSO   CESAREAN SECTION     x2   DIAGNOSTIC LAPAROSCOPY     DILATION AND CURETTAGE OF UTERUS     MINI LAP STERILIZATION     TOTAL ABDOMINAL HYSTERECTOMY W/ BILATERAL SALPINGOOPHORECTOMY     TUBOPLASTY / TUBOTUBAL ANASTOMOSIS      Allergies  Allergen Reactions   Azelastine Itching   Iodinated Contrast Media Other (See Comments)   Sulfa Antibiotics Hives    ROS denies any nausea, vomiting, fever, chills, chest pain, shortness of breath.   Physical Exam: There were no vitals filed for this visit.  General: The patient is alert and oriented x3 in no acute distress.  Dermatology: Skin is warm, dry and supple bilateral lower extremities. Interspaces are clear of maceration and debris.  Bilateral great toenails are notable for yellow discoloration, thickening, brittle appearance, onycholysis concerning for onychomycosis.  There are also changes to the remaining lesser toenails bilaterally with white chalky appearance to the distal aspect.  Vascular: Palpable pedal pulses bilaterally. Capillary refill within normal limits.  No appreciable edema.  No erythema or  calor.  Neurological: Light touch sensation grossly intact bilateral feet.   Musculoskeletal Exam: No pedal deformities noted  Radiographic Exam:  Deferred  Assessment/Plan of Care: 1. Dermatophytosis of nail      No orders of the defined types were placed in this encounter.  None  Discussed clinical findings with patient today.  Nail specimen obtained from the bilateral great toenails.  Discussed potential treatment options going forward including oral medication versus procedure to remove the toenails.  Did discuss that these changes could be secondary to trauma or due to fungus, or combination of both.  Return in about 1 month to discuss nail pathology results.   Desiree Holmes L. Desiree Holmes, AACFAS [facility]     [address] [place], [place] [address]                Office [phone]  Fax [phone]

## 2023-11-18 ENCOUNTER — Ambulatory Visit: Payer: BC Managed Care – PPO | Admitting: Physician Assistant

## 2023-11-18 ENCOUNTER — Encounter: Payer: Self-pay | Admitting: Physician Assistant

## 2023-11-18 VITALS — BP 130/72 | HR 95 | Temp 97.7°F | Resp 16 | Ht 63.0 in | Wt 200.4 lb

## 2023-11-18 DIAGNOSIS — E782 Mixed hyperlipidemia: Secondary | ICD-10-CM | POA: Diagnosis not present

## 2023-11-18 DIAGNOSIS — I10 Essential (primary) hypertension: Secondary | ICD-10-CM | POA: Diagnosis not present

## 2023-11-18 DIAGNOSIS — K219 Gastro-esophageal reflux disease without esophagitis: Secondary | ICD-10-CM | POA: Diagnosis not present

## 2023-11-18 MED ORDER — PHENTERMINE HCL 37.5 MG PO CAPS: 37.5000 mg | ORAL_CAPSULE | ORAL | 1 refills | Status: DC

## 2023-11-18 NOTE — Addendum Note (Signed)
Addended by: Tawny Asal I on: 11/18/2023 04:40 PM   Modules accepted: Orders

## 2023-11-18 NOTE — Progress Notes (Signed)
 Subjective:  Patient ID: Desiree Holmes, female    DOB: May 24, 1961  Age: 62 y.o. MRN: [id number]  Chief Complaint  Patient presents with   Medical Management of Chronic Issues    Hypertension    Pt presents for follow up of hypertension. The patient is tolerating the medication well without side effects. Compliance with treatment has been good; including taking medication as directed , maintains a healthy diet and regular exercise regimen , and following up as directed. Currently taking losaratan 100mg  qd   Pt with history of GERD - currently on prilosec 40mg  qd - does not take daily  Pt with elevated lipids in past - currently on fish oil  Has been on adipex for weight loss in past and would like to restart Current Outpatient Medications on File Prior to Visit  Medication Sig Dispense Refill   Calcium Carb-Cholecalciferol (SM CALCIUM 500/VITAMIN D3 PO) Take by mouth.     cetirizine (ZYRTEC) 10 MG tablet Take 10 mg by mouth daily.     estradiol  (ESTRACE ) 1 MG tablet TAKE 1 TABLET(1 MG) BY MOUTH DAILY 90 tablet 3   losartan  (COZAAR ) 100 MG tablet TAKE 1 TABLET(100 MG) BY MOUTH DAILY 90 tablet 0   Omega-3 Fatty Acids (FISH OIL) 1000 MG CAPS Take by mouth.     omeprazole  (PRILOSEC) 40 MG capsule TAKE 1 CAPSULE BY MOUTH EVERY DAY 90 capsule 1   vitamin C (ASCORBIC ACID) 500 MG tablet Take 500 mg by mouth daily.     No current facility-administered medications on file prior to visit.   Past Medical History:  Diagnosis Date   Barrett's esophagus    H. pylori infection    History of colon polyps    Past Surgical History:  Procedure Laterality Date   ABDOMINAL HYSTERECTOMY     with BSO   CESAREAN SECTION     x2   DIAGNOSTIC LAPAROSCOPY     DILATION AND CURETTAGE OF UTERUS     MINI LAP STERILIZATION     TOTAL ABDOMINAL HYSTERECTOMY W/ BILATERAL SALPINGOOPHORECTOMY     TUBOPLASTY / TUBOTUBAL ANASTOMOSIS      Family History  Problem Relation Age of Onset   COPD  Father    Cancer Father        STOMACH   Diabetes Mother    Hypertension Mother    Thyroid  disease Mother    Lupus Sister    Diabetes Maternal Grandmother    Heart disease Maternal Grandfather    Heart disease Paternal Grandfather    Cancer Paternal Grandfather        COLON   Breast cancer Neg Hx    Social History   Socioeconomic History   Marital status: Married    Spouse name: Not on file   Number of children: 1   Years of education: Not on file   Highest education level: 12th grade  Occupational History   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  Tobacco Use   Smoking status: Never   Smokeless tobacco: Never  Vaping Use   Vaping status: Never Used  Substance and Sexual Activity   Alcohol use: No    Alcohol/week: 0.0 standard drinks of alcohol   Drug use: No   Sexual activity: Not Currently    Birth control/protection: Surgical    Comment: 1st intercourse 77 yo-5 partners  Other Topics Concern   Not on file  Social History Narrative   Not on file   Social Drivers of Health   Financial Resource Strain:  Low Risk  (10/11/2023)   Overall Financial Resource Strain (CARDIA)    Difficulty of Paying Living Expenses: Not hard at all  Food Insecurity: No Food Insecurity (10/11/2023)   Hunger Vital Sign    Worried About Running Out of Food in the Last Year: Never true    Ran Out of Food in the Last Year: Never true  Transportation Needs: No Transportation Needs (10/11/2023)   PRAPARE - Administrator, Civil Service (Medical): No    Lack of Transportation (Non-Medical): No  Physical Activity: Sufficiently Active (10/11/2023)   Exercise Vital Sign    Days of Exercise per Week: 5 days    Minutes of Exercise per Session: 60 min  Stress: No Stress Concern Present (10/11/2023)   [employer] of Occupational Health - Occupational Stress Questionnaire    Feeling of Stress : Not at all  Social Connections: Moderately Integrated (10/11/2023)   Social Connection and  Isolation Panel [NHANES]    Frequency of Communication with Friends and Family: More than three times a week    Frequency of Social Gatherings with Friends and Family: Three times a week    Attends Religious Services: More than 4 times per year    Active Member of Clubs or Organizations: No    Attends Banker Meetings: Patient declined    Marital Status: Married   CONSTITUTIONAL: Negative for chills, fatigue, fever, unintentional weight gain and unintentional weight loss.  E/N/T: Negative for ear pain, nasal congestion and sore throat.  CARDIOVASCULAR: Negative for chest pain, dizziness, palpitations and pedal edema.  RESPIRATORY: Negative for recent cough and dyspnea.  GASTROINTESTINAL: Negative for abdominal pain, acid reflux symptoms, constipation, diarrhea, nausea and vomiting.  MSK: Negative for arthralgias and myalgias.  INTEGUMENTARY: Negative for rash.  NEUROLOGICAL: Negative for dizziness and headaches.  PSYCHIATRIC: Negative for sleep disturbance and to question depression screen.  Negative for depression, negative for anhedonia.       Objective:  PHYSICAL EXAM:   VS: BP 130/72 (BP Location: Right Arm, Patient Position: Sitting, Cuff Size: Large)   Pulse 95   Temp 97.7 F (36.5 C) (Temporal)   Resp 16   Ht 5' 3 (1.6 m)   Wt 200 lb 6.4 oz (90.9 kg)   SpO2 100%   BMI 35.50 kg/m   GEN: Well nourished, well developed, in no acute distress  Cardiac: RRR; no murmurs, rubs, or gallops,no edema - Respiratory:  normal respiratory rate and pattern with no distress - normal breath sounds with no rales, rhonchi, wheezes or rubs Skin: warm and dry, no rash  Neuro:  Alert and Oriented x 3,  - CN II-Xii grossly intact Psych: euthymic mood, appropriate affect and demeanor    Lab Results  Component Value Date   WBC 12.0 (H) 02/12/2023   HGB 14.0 02/12/2023   HCT 41.4 02/12/2023   PLT 322 02/12/2023   GLUCOSE 78 02/04/2023   CHOL 183 02/04/2023   TRIG 194 (H)  02/04/2023   HDL 60 02/04/2023   LDLCALC 90 02/04/2023   ALT 36 (H) 02/04/2023   AST 14 02/04/2023   NA 140 02/04/2023   K 4.3 02/04/2023   CL 100 02/04/2023   CREATININE 0.67 02/04/2023   BUN 14 02/04/2023   CO2 21 02/04/2023   TSH 0.678 02/04/2023      Assessment & Plan:   Problem List Items Addressed This Visit       Cardiovascular and Mediastinum   Benign hypertension - Primary Continue  current meds     Other   GERD Continue prilosec as directed      Hyperlipidemia Continue fish oil      Other Visit Diagnoses     BMI 34.0-34.9,adult       Relevant Medications   phentermine  (ADIPEX-P ) 37.5 MG tablet Watch diet Increase physical activity Follow up 2 months     .  Meds ordered this encounter  Medications   phentermine  37.5 MG capsule    Sig: Take 1 capsule (37.5 mg total) by mouth every morning.    Dispense:  30 capsule    Refill:  1    Supervising Provider:   COX, KIRSTEN [phone]    Orders Placed This Encounter  Procedures   CBC with Differential/Platelet   Comprehensive metabolic panel   TSH   Lipid panel     Follow-up: Return in about 2 months (around 01/16/2024) for follow-up.  An After Visit Summary was printed and given to the patient.  CAMIE JONELLE NICHOLAUS DEVONNA [facility] [phone]

## 2023-11-20 ENCOUNTER — Other Ambulatory Visit: Payer: BC Managed Care – PPO

## 2023-11-20 DIAGNOSIS — I10 Essential (primary) hypertension: Secondary | ICD-10-CM

## 2023-11-20 DIAGNOSIS — E782 Mixed hyperlipidemia: Secondary | ICD-10-CM

## 2023-11-21 LAB — COMPREHENSIVE METABOLIC PANEL
ALT: 22 [IU]/L (ref 0–32)
AST: 21 [IU]/L (ref 0–40)
Albumin: 4.3 g/dL (ref 3.9–4.9)
Alkaline Phosphatase: 99 [IU]/L (ref 44–121)
BUN/Creatinine Ratio: 13 (ref 12–28)
BUN: 11 mg/dL (ref 8–27)
Bilirubin Total: 0.5 mg/dL (ref 0.0–1.2)
CO2: 22 mmol/L (ref 20–29)
Calcium: 10.4 mg/dL — ABNORMAL HIGH (ref 8.7–10.3)
Chloride: 99 mmol/L (ref 96–106)
Creatinine, Ser: 0.84 mg/dL (ref 0.57–1.00)
Globulin, Total: 2.9 g/dL (ref 1.5–4.5)
Glucose: 66 mg/dL — ABNORMAL LOW (ref 70–99)
Potassium: 4.8 mmol/L (ref 3.5–5.2)
Sodium: 141 mmol/L (ref 134–144)
Total Protein: 7.2 g/dL (ref 6.0–8.5)
eGFR: 78 mL/min/{1.73_m2} (ref >59)

## 2023-11-21 LAB — CBC WITH DIFFERENTIAL/PLATELET
Basophils Absolute: 0 10*3/uL (ref 0.0–0.2)
Basos: 0 %
EOS (ABSOLUTE): 0.1 10*3/uL (ref 0.0–0.4)
Eos: 1 %
Hematocrit: 47 % — ABNORMAL HIGH (ref 34.0–46.6)
Hemoglobin: 14.9 g/dL (ref 11.1–15.9)
Immature Grans (Abs): 0.1 10*3/uL (ref 0.0–0.1)
Immature Granulocytes: 1 %
Lymphocytes Absolute: 4.1 10*3/uL — ABNORMAL HIGH (ref 0.7–3.1)
Lymphs: 35 %
MCH: 28.8 pg (ref 26.6–33.0)
MCHC: 31.7 g/dL (ref 31.5–35.7)
MCV: 91 fL (ref 79–97)
Monocytes Absolute: 0.6 10*3/uL (ref 0.1–0.9)
Monocytes: 5 %
Neutrophils Absolute: 6.8 10*3/uL (ref 1.4–7.0)
Neutrophils: 58 %
Platelets: 354 10*3/uL (ref 150–450)
RBC: 5.18 x10E6/uL (ref 3.77–5.28)
RDW: 12.2 % (ref 11.7–15.4)
WBC: 11.7 10*3/uL — ABNORMAL HIGH (ref 3.4–10.8)

## 2023-11-21 LAB — TSH: TSH: 2.42 u[IU]/mL (ref 0.450–4.500)

## 2023-11-21 LAB — LIPID PANEL
Chol/HDL Ratio: 2.7 {ratio} (ref 0.0–4.4)
Cholesterol, Total: 196 mg/dL (ref 100–199)
HDL: 73 mg/dL (ref >39)
LDL Chol Calc (NIH): 104 mg/dL — ABNORMAL HIGH (ref 0–99)
Triglycerides: 110 mg/dL (ref 0–149)
VLDL Cholesterol Cal: 19 mg/dL (ref 5–40)

## 2023-11-25 ENCOUNTER — Ambulatory Visit: Payer: BC Managed Care – PPO

## 2023-11-27 ENCOUNTER — Other Ambulatory Visit: Payer: Self-pay | Admitting: Podiatry

## 2023-12-03 ENCOUNTER — Telehealth: Payer: Self-pay

## 2023-12-03 NOTE — Telephone Encounter (Signed)
 Patient called and let a message, asking about her nail culture results. Thanks

## 2023-12-04 ENCOUNTER — Other Ambulatory Visit: Payer: Self-pay | Admitting: Family Medicine

## 2023-12-08 ENCOUNTER — Other Ambulatory Visit: Payer: Self-pay

## 2023-12-08 DIAGNOSIS — Z79890 Hormone replacement therapy (postmenopausal): Secondary | ICD-10-CM

## 2023-12-08 NOTE — Telephone Encounter (Signed)
Med refill request: estrace Last AEX: 11/19/22 Next AEX: 12/29/23 Last MMG (if hormonal med) 11/19/22 Refill authorized: Please Advise, #[id number], 0 RF

## 2023-12-09 MED ORDER — ESTRADIOL 1 MG PO TABS: ORAL_TABLET | ORAL | 0 refills | Status: DC

## 2023-12-20 ENCOUNTER — Other Ambulatory Visit: Payer: Self-pay | Admitting: Physician Assistant

## 2023-12-22 ENCOUNTER — Ambulatory Visit: Payer: BC Managed Care – PPO | Admitting: Podiatry

## 2023-12-22 ENCOUNTER — Encounter: Payer: Self-pay | Admitting: Podiatry

## 2023-12-22 DIAGNOSIS — L601 Onycholysis: Secondary | ICD-10-CM

## 2023-12-22 DIAGNOSIS — L603 Nail dystrophy: Secondary | ICD-10-CM

## 2023-12-22 NOTE — Patient Instructions (Signed)

## 2023-12-22 NOTE — Progress Notes (Unsigned)
B/l total nail

## 2023-12-29 ENCOUNTER — Ambulatory Visit: Payer: BC Managed Care – PPO

## 2023-12-29 ENCOUNTER — Ambulatory Visit: Payer: BC Managed Care – PPO | Admitting: Nurse Practitioner

## 2024-01-08 ENCOUNTER — Other Ambulatory Visit: Payer: Self-pay | Admitting: Podiatry

## 2024-01-08 DIAGNOSIS — L02611 Cutaneous abscess of right foot: Secondary | ICD-10-CM

## 2024-01-08 MED ORDER — DOXYCYCLINE HYCLATE 100 MG PO TABS
100.0000 mg | ORAL_TABLET | Freq: Two times a day (BID) | ORAL | 0 refills | Status: AC
Start: 2024-01-08 — End: 2024-01-18

## 2024-01-08 NOTE — Telephone Encounter (Signed)
 Patient has uploaded pictures through MyChart that she would like a doctor or nurse to look at, and to give her a call back. Thank you.

## 2024-01-13 ENCOUNTER — Ambulatory Visit: Payer: BC Managed Care – PPO | Admitting: Podiatry

## 2024-01-13 DIAGNOSIS — L601 Onycholysis: Secondary | ICD-10-CM

## 2024-01-13 NOTE — Progress Notes (Unsigned)
       Subjective:  Patient ID: Desiree Holmes, female    DOB: 05-Oct-1961,  MRN: [id number]  Chief Complaint  Patient presents with   Nail check    Bilateral great toe nails removed. Looks good, she soaked twice a day for the first week and once a day for the second. Tho she did state she had a hard time getting in touch with anyone here for clarification on instructions. Not diabetic and no anti coag, allergic to SULFA     Desiree Holmes presents to clinic today for f/u of total nail avulsion to the bilateral great toes due to nail plate separation and onychodystrophy.  She is doing well at this point.  She did have concern for possible infection to the left first toe and requested follow-up.  At this point she has minimal to no pain.  Denies redness to the site.  No signs of pus or drainage at this point.  PCP is Marianne Sofia, PA-C.  Allergies  Allergen Reactions   Azelastine Itching and Anaphylaxis   Iodinated Contrast Media Other (See Comments)   Sulfa Antibiotics Hives    Objective:  There were no vitals filed for this visit.  Vascular Examination: Capillary refill time is less than 3 seconds to toes bilateral. Palpable pedal pulses b/l LE. Digital hair present b/l. No pedal edema b/l. Skin temperature gradient WNL b/l. No varicosities b/l. No cyanosis or clubbing noted b/l.   Dermatological Examination: Upon inspection of the bilateral first toe total nail avulsion site, there are no clinical signs of infection.  No purulence, no necrosis, no malodor present.  Minimal to no erythema present.  Eschar formed along nail bed.  Minimal to no pain on palpation of area.   Assessment/Plan: 1. Nail plate separation     No orders of the defined types were placed in this encounter.  Assurances given to patient.  Doing well.  She was prescribed oral antibiotics following phone encounter.  Instructed patient to complete course.  Bilateral first toes appear to be healing  well.  Stable scab present.  Follow-up as needed.  Return if symptoms worsen or fail to improve.   Suzetta Timko L. Marchia Bond, AACFAS [facility]     [address] [place], [place] [address]                Office [phone]  Fax [phone]

## 2024-01-19 ENCOUNTER — Ambulatory Visit: Payer: BC Managed Care – PPO | Admitting: Physician Assistant

## 2024-01-27 ENCOUNTER — Ambulatory Visit
Admission: RE | Admit: 2024-01-27 | Discharge: 2024-01-27 | Disposition: A | Discharge status: Home / Self Care | Payer: BC Managed Care – PPO | Source: Ambulatory Visit | Attending: Nurse Practitioner

## 2024-01-27 DIAGNOSIS — Z1231 Encounter for screening mammogram for malignant neoplasm of breast: Secondary | ICD-10-CM

## 2024-02-05 ENCOUNTER — Encounter: Payer: Self-pay | Admitting: Physician Assistant

## 2024-02-05 ENCOUNTER — Ambulatory Visit: Payer: BC Managed Care – PPO | Admitting: Physician Assistant

## 2024-02-05 VITALS — BP 136/84 | HR 90 | Temp 98.0°F | Resp 18 | Ht 63.0 in | Wt 200.8 lb

## 2024-02-05 DIAGNOSIS — E66812 Obesity, class 2: Secondary | ICD-10-CM | POA: Diagnosis not present

## 2024-02-05 DIAGNOSIS — Z6835 Body mass index (BMI) 35.0-35.9, adult: Secondary | ICD-10-CM

## 2024-02-05 DIAGNOSIS — I10 Essential (primary) hypertension: Secondary | ICD-10-CM | POA: Diagnosis not present

## 2024-02-05 MED ORDER — PHENTERMINE HCL 37.5 MG PO CAPS: 37.5000 mg | ORAL_CAPSULE | ORAL | 1 refills | Status: DC

## 2024-02-05 NOTE — Progress Notes (Signed)
 Subjective:  Patient ID: Desiree Holmes, female    DOB: Mar 26, 1961  Age: 63 y.o. MRN: [id number]  Chief Complaint  Patient presents with   Obesity    2M Follow Up    HPI  Pt presents for follow up of hypertension. The patient is tolerating the medication well without side effects. Compliance with treatment has been good; including taking medication as directed , maintains a healthy diet and regular exercise regimen , and following up as directed. Currently taking losaratan 100mg  qd   Pt has been taking adipex to help with weight loss - states that she has been taking medication but took a break for a few weeks - weight is stable and states she has been feeling like medication is helping with appetite She would like to continue med  Current Outpatient Medications on File Prior to Visit  Medication Sig Dispense Refill   Calcium Carb-Cholecalciferol (SM CALCIUM 500/VITAMIN D3 PO) Take by mouth.     cetirizine (ZYRTEC) 10 MG tablet Take 10 mg by mouth daily.     estradiol (ESTRACE) 1 MG tablet TAKE 1 TABLET(1 MG) BY MOUTH DAILY 90 tablet 0   losartan (COZAAR) 100 MG tablet TAKE 1 TABLET(100 MG) BY MOUTH DAILY 90 tablet 0   Omega-3 Fatty Acids (FISH OIL) 1000 MG CAPS Take by mouth.     omeprazole (PRILOSEC) 40 MG capsule TAKE 1 CAPSULE BY MOUTH EVERY DAY 90 capsule 1   vitamin C (ASCORBIC ACID) 500 MG tablet Take 500 mg by mouth daily.     No current facility-administered medications on file prior to visit.   Past Medical History:  Diagnosis Date   Barrett's esophagus    H. pylori infection    History of colon polyps    Past Surgical History:  Procedure Laterality Date   ABDOMINAL HYSTERECTOMY     with BSO   CESAREAN SECTION     x2   DIAGNOSTIC LAPAROSCOPY     DILATION AND CURETTAGE OF UTERUS     MINI LAP STERILIZATION     TOTAL ABDOMINAL HYSTERECTOMY W/ BILATERAL SALPINGOOPHORECTOMY     TUBOPLASTY / TUBOTUBAL ANASTOMOSIS      Family History  Problem Relation Age  of Onset   COPD Father    Cancer Father        STOMACH   Diabetes Mother    Hypertension Mother    Thyroid disease Mother    Lupus Sister    Diabetes Maternal Grandmother    Heart disease Maternal Grandfather    Heart disease Paternal Grandfather    Cancer Paternal Grandfather        COLON   Breast cancer Neg Hx    Social History   Socioeconomic History   Marital status: Married    Spouse name: Not on file   Number of children: 1   Years of education: Not on file   Highest education level: Some college, no degree  Occupational History   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  Tobacco Use   Smoking status: Never   Smokeless tobacco: Never  Vaping Use   Vaping status: Never Used  Substance and Sexual Activity   Alcohol use: No    Alcohol/week: 0.0 standard drinks of alcohol   Drug use: No   Sexual activity: Not Currently    Birth control/protection: Surgical    Comment: 1st intercourse 16 yo-5 partners  Other Topics Concern   Not on file  Social History Narrative   Not on file   Social  Drivers of Health   Financial Resource Strain: Low Risk  (02/02/2024)   Overall Financial Resource Strain (CARDIA)    Difficulty of Paying Living Expenses: Not hard at all  Food Insecurity: No Food Insecurity (02/02/2024)   Hunger Vital Sign    Worried About Running Out of Food in the Last Year: Never true    Ran Out of Food in the Last Year: Never true  Transportation Needs: No Transportation Needs (02/02/2024)   PRAPARE - Administrator, Civil Service (Medical): No    Lack of Transportation (Non-Medical): No  Physical Activity: Unknown (02/02/2024)   Exercise Vital Sign    Days of Exercise per Week: 5 days    Minutes of Exercise per Session: Patient declined  Stress: No Stress Concern Present (02/02/2024)   [employer] of Occupational Health - Occupational Stress Questionnaire    Feeling of Stress : Only a little  Social Connections: Moderately Integrated (02/02/2024)    Social Connection and Isolation Panel [NHANES]    Frequency of Communication with Friends and Family: Three times a week    Frequency of Social Gatherings with Friends and Family: Twice a week    Attends Religious Services: More than 4 times per year    Active Member of [employer] or Organizations: No    Attends Engineer, structural: Patient declined    Marital Status: Married   CONSTITUTIONAL: Negative for chills, fatigue, fever,   CARDIOVASCULAR: Negative for chest pain, dizziness, RESPIRATORY: Negative for recent cough and dyspnea.  GASTROINTESTINAL: Negative for abdominal pain, acid reflux symptoms, constipation, diarrhea, nausea and vomiting.        Objective:  PHYSICAL EXAM:   VS: BP 136/84 (BP Location: Left Arm, Patient Position: Sitting, Cuff Size: Normal)   Pulse 90   Temp 98 F (36.7 C) (Temporal)   Resp 18   Ht 5\' 3"  (1.6 m)   Wt 200 lb 12.8 oz (91.1 kg)   SpO2 100%   BMI 35.57 kg/m   GEN: Well nourished, well developed, in no acute distress   Cardiac: RRR; no murmurs, Respiratory:  normal respiratory rate and pattern with no distress - normal breath sounds with no rales, rhonchi, wheezes or rubs  Psych: euthymic mood, appropriate affect and demeanor  Lab Results  Component Value Date   WBC 11.7 (H) 11/20/2023   HGB 14.9 11/20/2023   HCT 47.0 (H) 11/20/2023   PLT 354 11/20/2023   GLUCOSE 66 (L) 11/20/2023   CHOL 196 11/20/2023   TRIG 110 11/20/2023   HDL 73 11/20/2023   LDLCALC 104 (H) 11/20/2023   ALT 22 11/20/2023   AST 21 11/20/2023   NA 141 11/20/2023   K 4.8 11/20/2023   CL 99 11/20/2023   CREATININE 0.84 11/20/2023   BUN 11 11/20/2023   CO2 22 11/20/2023   TSH 2.420 11/20/2023      Assessment & Plan:   Problem List Items Addressed This Visit       Cardiovascular and Mediastinum   Benign hypertension - Primary Continue current meds     Other   Obesity, Class II, BMI 35-39.9            Other Visit Diagnoses     BMI  34.0-34.9,adult       Relevant Medications   phentermine (ADIPEX-P) 37.5 MG tablet     .  Meds ordered this encounter  Medications   phentermine 37.5 MG capsule    Sig: Take 1 capsule (37.5 mg total) by  mouth every morning.    Dispense:  30 capsule    Refill:  1    Supervising Provider:   COX, KIRSTEN [id number]    No orders of the defined types were placed in this encounter.    Follow-up: Return in about 2 months (around 04/06/2024) for follow-up.  An After Visit Summary was printed and given to the patient.  Jettie Pagan [facility] [phone]

## 2024-02-09 ENCOUNTER — Telehealth: Payer: Self-pay | Admitting: Podiatry

## 2024-02-09 NOTE — Telephone Encounter (Signed)
 Patient is requesting to speak with Dr. Jamse Arn or his nurse regarding great toe on both feet. Patient has a few questions she would like to ask. Patient contact telephone number [phone]

## 2024-02-10 ENCOUNTER — Telehealth: Payer: Self-pay | Admitting: Podiatry

## 2024-02-10 NOTE — Telephone Encounter (Signed)
 Called patient as she requested, she had questions about total nail avulsion sites done in early February.  Denies pain to them but has had some persistent scab.  She states that she has been going about her activities as normal, has not only been soaking the toes however.  Advised her that she can try soaking the toe to loosen up the scab and that I expect this to clear up and that anything underneath the scab should be well healed at this point. She can clean the area with Q tip and rubbing alcohol. She will send me a picture via mychart, can come in for evaluation as needed.

## 2024-02-10 NOTE — Telephone Encounter (Signed)
 Merged called with MyChart support to help her upload a picture.

## 2024-02-10 NOTE — Telephone Encounter (Signed)
 Pt trying to upload pictures,

## 2024-02-11 ENCOUNTER — Encounter: Payer: Self-pay | Admitting: Podiatry

## 2024-03-01 ENCOUNTER — Ambulatory Visit: Payer: BC Managed Care – PPO | Admitting: Nurse Practitioner

## 2024-03-09 DIAGNOSIS — H10023 Other mucopurulent conjunctivitis, bilateral: Secondary | ICD-10-CM | POA: Diagnosis not present

## 2024-03-10 DIAGNOSIS — H1089 Other conjunctivitis: Secondary | ICD-10-CM | POA: Diagnosis not present

## 2024-03-12 ENCOUNTER — Other Ambulatory Visit: Payer: Self-pay | Admitting: Nurse Practitioner

## 2024-03-12 DIAGNOSIS — Z79890 Hormone replacement therapy (postmenopausal): Secondary | ICD-10-CM

## 2024-03-12 NOTE — Telephone Encounter (Signed)
 Med refill request: estrace  Last AEX: 11/19/22 Next AEX: 03/31/24 Last MMG (if hormonal med) 01/27/24 Refill authorized: last rx 12/09/23 #90 with 0 refills. Please approve or deny

## 2024-03-22 DIAGNOSIS — R071 Chest pain on breathing: Secondary | ICD-10-CM | POA: Diagnosis not present

## 2024-03-22 DIAGNOSIS — R0981 Nasal congestion: Secondary | ICD-10-CM | POA: Diagnosis not present

## 2024-03-22 DIAGNOSIS — R051 Acute cough: Secondary | ICD-10-CM | POA: Diagnosis not present

## 2024-03-24 ENCOUNTER — Other Ambulatory Visit: Payer: Self-pay | Admitting: Physician Assistant

## 2024-03-31 ENCOUNTER — Encounter: Payer: Self-pay | Admitting: Nurse Practitioner

## 2024-03-31 ENCOUNTER — Ambulatory Visit (INDEPENDENT_AMBULATORY_CARE_PROVIDER_SITE_OTHER): Payer: BC Managed Care – PPO | Admitting: Nurse Practitioner

## 2024-03-31 VITALS — BP 142/80 | HR 84 | Ht 62.0 in | Wt 194.0 lb

## 2024-03-31 DIAGNOSIS — Z01419 Encounter for gynecological examination (general) (routine) without abnormal findings: Secondary | ICD-10-CM | POA: Diagnosis not present

## 2024-03-31 DIAGNOSIS — Z1331 Encounter for screening for depression: Secondary | ICD-10-CM

## 2024-03-31 DIAGNOSIS — Z79890 Hormone replacement therapy (postmenopausal): Secondary | ICD-10-CM

## 2024-03-31 MED ORDER — ESTRADIOL 1 MG PO TABS: ORAL_TABLET | ORAL | 3 refills | Status: AC

## 2024-03-31 NOTE — Progress Notes (Signed)
 Desiree Holmes ZOXWRUEA 1961-08-18 [id number]   History:  63 y.o. [id number] presents for annual exam. Postmenopausal - on ERT. S/P 2004 TAH BSO for menorrhagia and ovarian cysts. Normal pap and mammogram history. History of Barrett's esophagus. HTN managed by PCP.   Gynecologic History No LMP recorded. Patient has had a hysterectomy.   Contraception/Family planning: status post hysterectomy Sexually active: Yes  Health Maintenance Last Pap: 09/21/2018. Results were: Normal Last mammogram: 01/27/2024. Results were: Normal Last colonoscopy: 12/15/2020. Results were: Benign polyps, 5-year recall Last Dexa: 12/25/2021. Results were: Normal  Past medical history, past surgical history, family history and social history were all reviewed and documented in the EPIC chart. Married. 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 27 yo daughter, married.   ROS:  A ROS was performed and pertinent positives and negatives are included.  Exam:  Vitals:   03/31/24 1553  BP: (!) 142/80  Pulse: 84  SpO2: 99%  Weight: 194 lb (88 kg)  Height: 5\' 2"  (1.575 m)    Body mass index is 35.48 kg/m.  General appearance:  Normal Thyroid:  Symmetrical, normal in size, without palpable masses or nodularity. Respiratory  Auscultation:  Clear without wheezing or rhonchi Cardiovascular  Auscultation:  Regular rate, without rubs, murmurs or gallops  Edema/varicosities:  Not grossly evident Abdominal  Soft,nontender, without masses, guarding or rebound.  Liver/spleen:  No organomegaly noted  Hernia:  None appreciated  Skin  Inspection:  Grossly normal Breasts: Examined lying and sitting.   Right: Without masses, retractions, nipple discharge or axillary adenopathy.   Left: Without masses, retractions, nipple discharge or axillary adenopathy. Pelvic: External genitalia:  no lesions. 1 mm skin tag on left labia majora              Urethra:  normal appearing urethra with no masses, tenderness or lesions               Bartholins and Skenes: normal                 Vagina: normal appearing vagina with normal color and discharge, no lesions. Atrophic changes              Cervix: absent Bimanual Exam:  Uterus:  absent              Adnexa: no mass, fullness, tenderness              Rectovaginal: Deferred              Anus:  normal, no lesions  Patient informed chaperone available to be present for breast and pelvic exam. Patient has requested no chaperone to be present. Patient has been advised what will be completed during breast and pelvic exam.   Assessment/Plan:  63 y.o. [id number] for annual exam.   Well female exam with routine gynecological exam - Education provided on SBEs, importance of preventative screenings, current guidelines, high calcium diet, regular exercise, and multivitamin daily.  Labs with PCP.   Postmenopausal - On ERT. S/P 2004 TAH BSO for menorrhagia and ovarian cysts.   Hormone replacement therapy - Plan: estradiol  (ESTRACE ) 1 MG tablet daily. She is aware of risks of blood clots, heart attack, stroke, and breast cancer. We did discuss trying to wean and she is agreeable. Plans to try to wean.   Screening for cervical cancer - Normal Pap history. No longer screening per guidelines.   Screening for breast cancer - Normal mammogram history.  Continue annual screenings.  Normal breast  exam today.   Screening for colon cancer - 11/2020 colonoscopy. Will repeat at GI's recommended interval.   Screening for osteoporosis - Normal bone density 12/2021. Will repeat at 5-year interval.   Return in about 1 year (around 03/31/2025) for Annual.    Desiree Bamberger DNP, 4:22 PM 03/31/2024

## 2024-04-06 ENCOUNTER — Encounter: Payer: Self-pay | Admitting: Physician Assistant

## 2024-04-06 ENCOUNTER — Ambulatory Visit: Admitting: Physician Assistant

## 2024-04-06 VITALS — BP 130/86 | HR 89 | Temp 98.5°F | Resp 18 | Ht 62.0 in | Wt 195.8 lb

## 2024-04-06 DIAGNOSIS — I10 Essential (primary) hypertension: Secondary | ICD-10-CM

## 2024-04-06 NOTE — Progress Notes (Signed)
 Subjective:  Patient ID: Desiree Holmes, female    DOB: August 31, 1961  Age: 63 y.o. MRN: [id number]  Chief Complaint  Patient presents with   Obesity    HPI  Pt presents for follow up of hypertension. The patient is tolerating the medication well without side effects. Compliance with treatment has been good; including taking medication as directed , maintains a healthy diet and regular exercise regimen , and following up as directed. Currently taking losaratan 100mg  qd   Pt never started phentermine  but has rx - starting today  She has been following with a diet coach and currently on a regimen to include daily intake 166g of protein, 55g fat, and 125g carbs She states she has lost a little weight  but is feeling more energy  Current Outpatient Medications on File Prior to Visit  Medication Sig Dispense Refill   Calcium Carb-Cholecalciferol (SM CALCIUM 500/VITAMIN D3 PO) Take by mouth.     cetirizine (ZYRTEC) 10 MG tablet Take 10 mg by mouth daily.     estradiol  (ESTRACE ) 1 MG tablet TAKE 1 TABLET(1 MG) BY MOUTH DAILY 90 tablet 3   losartan  (COZAAR ) 100 MG tablet TAKE 1 TABLET(100 MG) BY MOUTH DAILY 90 tablet 0   Omega-3 Fatty Acids (FISH OIL) 1000 MG CAPS Take by mouth.     omeprazole  (PRILOSEC) 40 MG capsule TAKE 1 CAPSULE BY MOUTH EVERY DAY 90 capsule 1   phentermine  37.5 MG capsule Take 1 capsule (37.5 mg total) by mouth every morning. 30 capsule 1   vitamin C (ASCORBIC ACID) 500 MG tablet Take 500 mg by mouth daily.     No current facility-administered medications on file prior to visit.   Past Medical History:  Diagnosis Date   Barrett's esophagus    H. pylori infection    History of colon polyps    Past Surgical History:  Procedure Laterality Date   ABDOMINAL HYSTERECTOMY     with BSO   CESAREAN SECTION     x2   DIAGNOSTIC LAPAROSCOPY     DILATION AND CURETTAGE OF UTERUS     MINI LAP STERILIZATION     TOTAL ABDOMINAL HYSTERECTOMY W/ BILATERAL  SALPINGOOPHORECTOMY     TUBOPLASTY / TUBOTUBAL ANASTOMOSIS      Family History  Problem Relation Age of Onset   COPD Father    Cancer Father        STOMACH   Diabetes Mother    Hypertension Mother    Thyroid disease Mother    Lupus Sister    Diabetes Maternal Grandmother    Heart disease Maternal Grandfather    Heart disease Paternal Grandfather    Cancer Paternal Grandfather        COLON   Breast cancer Neg Hx    Social History   Socioeconomic History   Marital status: Married    Spouse name: Not on file   Number of children: 1   Years of education: Not on file   Highest education level: Some college, no degree  Occupational History   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  Tobacco Use   Smoking status: Never   Smokeless tobacco: Never  Vaping Use   Vaping status: Never Used  Substance and Sexual Activity   Alcohol use: No    Alcohol/week: 0.0 standard drinks of alcohol   Drug use: No   Sexual activity: Not Currently    Birth control/protection: Surgical    Comment: 1st intercourse 16 yo-5 partners  Other Topics Concern  Not on file  Social History Narrative   Not on file   Social Drivers of Health   Financial Resource Strain: Low Risk  (02/02/2024)   Overall Financial Resource Strain (CARDIA)    Difficulty of Paying Living Expenses: Not hard at all  Food Insecurity: No Food Insecurity (02/02/2024)   Hunger Vital Sign    Worried About Running Out of Food in the Last Year: Never true    Ran Out of Food in the Last Year: Never true  Transportation Needs: No Transportation Needs (02/02/2024)   PRAPARE - Administrator, Civil Service (Medical): No    Lack of Transportation (Non-Medical): No  Physical Activity: Unknown (02/02/2024)   Exercise Vital Sign    Days of Exercise per Week: 5 days    Minutes of Exercise per Session: Patient declined  Stress: No Stress Concern Present (02/02/2024)   [employer] of Occupational Health - Occupational Stress  Questionnaire    Feeling of Stress : Only a little  Social Connections: Moderately Integrated (02/02/2024)   Social Connection and Isolation Panel [NHANES]    Frequency of Communication with Friends and Family: Three times a week    Frequency of Social Gatherings with Friends and Family: Twice a week    Attends Religious Services: More than 4 times per year    Active Member of [employer] or Organizations: No    Attends Engineer, structural: Patient declined    Marital Status: Married   CONSTITUTIONAL: Negative for chills, fatigue, fever,   CARDIOVASCULAR: Negative for chest pain, dizziness, palpitations and pedal edema.  RESPIRATORY: Negative for recent cough and dyspnea.   PSYCHIATRIC: Negative for sleep disturbance and to question depression screen.  Negative for depression, negative for anhedonia.       Objective:  PHYSICAL EXAM:   VS: BP 130/86   Pulse 89   Temp 98.5 F (36.9 C) (Temporal)   Resp 18   Ht 5\' 2"  (1.575 m)   Wt 195 lb 12.8 oz (88.8 kg)   SpO2 99%   BMI 35.81 kg/m   GEN: Well nourished, well developed, in no acute distress   Cardiac: RRR; no murmurs, rubs Resp - lungs clear to auscultation Psych: euthymic mood, appropriate affect and demeanor   Lab Results  Component Value Date   WBC 11.7 (H) 11/20/2023   HGB 14.9 11/20/2023   HCT 47.0 (H) 11/20/2023   PLT 354 11/20/2023   GLUCOSE 66 (L) 11/20/2023   CHOL 196 11/20/2023   TRIG 110 11/20/2023   HDL 73 11/20/2023   LDLCALC 104 (H) 11/20/2023   ALT 22 11/20/2023   AST 21 11/20/2023   NA 141 11/20/2023   K 4.8 11/20/2023   CL 99 11/20/2023   CREATININE 0.84 11/20/2023   BUN 11 11/20/2023   CO2 22 11/20/2023   TSH 2.420 11/20/2023      Assessment & Plan:   Problem List Items Addressed This Visit       Cardiovascular and Mediastinum   Benign hypertension - Primary Continue current meds     Other   Obesity, Class II, BMI 35-39.9   Continue to watch diet         Other Visit  Diagnoses     BMI 34.0-34.9,adult       Relevant Medications   phentermine  (ADIPEX-P ) 37.5 MG tablet     .  No orders of the defined types were placed in this encounter.   No orders of the defined types were  placed in this encounter.    Follow-up: Return in about 2 months (around 06/06/2024) for follow-up.  An After Visit Summary was printed and given to the patient.  Anthonette Bastos [facility] [phone]

## 2024-04-22 DIAGNOSIS — R0981 Nasal congestion: Secondary | ICD-10-CM | POA: Diagnosis not present

## 2024-04-23 ENCOUNTER — Encounter: Payer: Self-pay | Admitting: Podiatry

## 2024-04-24 DIAGNOSIS — R051 Acute cough: Secondary | ICD-10-CM | POA: Diagnosis not present

## 2024-04-24 DIAGNOSIS — J019 Acute sinusitis, unspecified: Secondary | ICD-10-CM | POA: Diagnosis not present

## 2024-04-24 DIAGNOSIS — J069 Acute upper respiratory infection, unspecified: Secondary | ICD-10-CM | POA: Diagnosis not present

## 2024-04-24 DIAGNOSIS — R0981 Nasal congestion: Secondary | ICD-10-CM | POA: Diagnosis not present

## 2024-04-28 ENCOUNTER — Encounter: Payer: Self-pay | Admitting: Physician Assistant

## 2024-04-28 ENCOUNTER — Ambulatory Visit: Payer: Self-pay

## 2024-04-28 ENCOUNTER — Ambulatory Visit: Admitting: Physician Assistant

## 2024-04-28 VITALS — BP 148/108 | HR 83 | Temp 97.2°F | Ht 62.0 in | Wt 197.0 lb

## 2024-04-28 DIAGNOSIS — R052 Subacute cough: Secondary | ICD-10-CM | POA: Insufficient documentation

## 2024-04-28 DIAGNOSIS — I10 Essential (primary) hypertension: Secondary | ICD-10-CM

## 2024-04-28 MED ORDER — DOXYCYCLINE HYCLATE 100 MG PO TABS: 100.0000 mg | ORAL_TABLET | Freq: Two times a day (BID) | ORAL | 0 refills | Status: DC

## 2024-04-28 NOTE — Telephone Encounter (Signed)
 I did not see this pt Desiree Holmes did

## 2024-04-28 NOTE — Telephone Encounter (Signed)
 Left message informing patient to continue taking both medications per Mirian Ames.

## 2024-04-28 NOTE — Telephone Encounter (Signed)
 Patient was prescribed the Augmentin  04/24/24 at Urgent Care- [facility]  Please advise patient.  Message forwarded appropriately.    ------------------------------------------------ Copied from CRM [id number]. Topic: Clinical - Medication Question >> Apr 28, 2024 10:50 AM Hassie Lint wrote: Reason for CRM: Patient just left the doctors where she prescribed doxycycline  (VIBRA -TABS) 100 MG tablet. Patient forgot to ask with the new prescription should she still take amoxicillin -clavulanate (AUGMENTIN ) 875-125 MG tablet.  Patient would like to be called back at [phone]

## 2024-04-28 NOTE — Assessment & Plan Note (Signed)
 Blood pressure elevated, possibly due to illness. Headaches and blurry vision likely related. Currently on losartan , not taking intramine. - Monitor blood pressure and symptoms as illness resolves. - Reassess medication adjustment if blood pressure remains elevated post-recovery.

## 2024-04-28 NOTE — Progress Notes (Signed)
 Acute Office Visit  Subjective:    Patient ID: Desiree Holmes, female    DOB: 1961-11-14, 63 y.o.   MRN: [id number]  Chief Complaint  Patient presents with   Cough    HPI: Patient is in today for continued cough  Discussed the use of AI scribe software for clinical note transcription with the patient, who gave verbal consent to proceed.  History of Present Illness   Desiree Holmes is a 63 year old female who presents with a persistent cough.  She has been experiencing a persistent cough for the past two weeks, which has not improved despite treatment. Initially diagnosed with a viral infection affecting her ears, nose, and throat, she was prescribed a steroid. However, her symptoms worsened, leading to a second visit where she was prescribed Augmentin , an antibiotic, for five days. She has not completed the Augmentin  course yet.  She experiences difficulty sleeping due to nasal drainage, which exacerbates her cough. She has no fever but reports anosmia and ageusia, prompting her to take a COVID test that morning. 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 She recalls having bronchitis two weeks ago, but no one at the daycare had similar symptoms.  Her current medications include losartan  for hypertension, and she has been prescribed doxycycline  and Augmentin  for her current symptoms. She also takes vitamin C and D regularly. She has not taken her blood pressure medication, Intramine, recently due to not feeling well.  She reports headaches associated with coughing and a slight blurring of vision in both eyes. No continuous coughing or vomiting, but she describes the cough as an 'aggravation to try to breathe.'  She has tried various over-the-counter medications for her symptoms, including a cold medicine called Norel, but did not find significant relief. She has not used an inhaler or cough medicines with codeine before. She has  allergies to sulfa antibiotics, azelastine, and contrast.       Past Medical History:  Diagnosis Date   Barrett's esophagus    H. pylori infection    History of colon polyps     Past Surgical History:  Procedure Laterality Date   ABDOMINAL HYSTERECTOMY     with BSO   CESAREAN SECTION     x2   DIAGNOSTIC LAPAROSCOPY     DILATION AND CURETTAGE OF UTERUS     MINI LAP STERILIZATION     TOTAL ABDOMINAL HYSTERECTOMY W/ BILATERAL SALPINGOOPHORECTOMY     TUBOPLASTY / TUBOTUBAL ANASTOMOSIS      Family History  Problem Relation Age of Onset   COPD Father    Cancer Father        STOMACH   Diabetes Mother    Hypertension Mother    Thyroid disease Mother    Lupus Sister    Diabetes Maternal Grandmother    Heart disease Maternal Grandfather    Heart disease Paternal Grandfather    Cancer Paternal Grandfather        COLON   Breast cancer Neg Hx     Social History   Socioeconomic History   Marital status: Married    Spouse name: Not on file   Number of children: 1   Years of education: Not on file   Highest education level: Some college, no degree  Occupational History   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  Tobacco Use   Smoking status: Never   Smokeless tobacco: Never  Vaping Use   Vaping status: Never Used  Substance and Sexual Activity   Alcohol use: No    Alcohol/week: 0.0 standard drinks of alcohol   Drug use: No   Sexual activity: Not Currently    Birth control/protection: Surgical    Comment: 1st intercourse 61 yo-5 partners  Other Topics Concern   Not on file  Social History Narrative   Not on file   Social Drivers of Health   Financial Resource Strain: Low Risk  (02/02/2024)   Overall Financial Resource Strain (CARDIA)    Difficulty of Paying Living Expenses: Not hard at all  Food Insecurity: No Food Insecurity (02/02/2024)   Hunger Vital Sign    Worried About Running Out of Food in the Last Year: Never true    Ran Out of Food in the Last Year: Never true   Transportation Needs: No Transportation Needs (02/02/2024)   PRAPARE - Administrator, Civil Service (Medical): No    Lack of Transportation (Non-Medical): No  Physical Activity: Unknown (02/02/2024)   Exercise Vital Sign    Days of Exercise per Week: 5 days    Minutes of Exercise per Session: Patient declined  Stress: No Stress Concern Present (02/02/2024)   [employer] of Occupational Health - Occupational Stress Questionnaire    Feeling of Stress : Only a little  Social Connections: Moderately Integrated (02/02/2024)   Social Connection and Isolation Panel [NHANES]    Frequency of Communication with Friends and Family: Three times a week    Frequency of Social Gatherings with Friends and Family: Twice a week    Attends Religious Services: More than 4 times per year    Active Member of [employer] or Organizations: No    Attends Banker Meetings: Patient declined    Marital Status: Married  Catering manager Violence: Not At Risk (06/10/2023)   Humiliation, Afraid, Rape, and Kick questionnaire    Fear of Current or Ex-Partner: No    Emotionally Abused: No    Physically Abused: No    Sexually Abused: No    Outpatient Medications Prior to Visit  Medication Sig Dispense Refill   amoxicillin -clavulanate (AUGMENTIN ) 875-125 MG tablet Take 1 tablet by mouth 2 (two) times daily.     Calcium Carb-Cholecalciferol (SM CALCIUM 500/VITAMIN D3 PO) Take by mouth.     cetirizine (ZYRTEC) 10 MG tablet Take 10 mg by mouth daily.     estradiol  (ESTRACE ) 1 MG tablet TAKE 1 TABLET(1 MG) BY MOUTH DAILY 90 tablet 3   losartan  (COZAAR ) 100 MG tablet TAKE 1 TABLET(100 MG) BY MOUTH DAILY 90 tablet 0   Omega-3 Fatty Acids (FISH OIL) 1000 MG CAPS Take by mouth.     omeprazole  (PRILOSEC) 40 MG capsule TAKE 1 CAPSULE BY MOUTH EVERY DAY 90 capsule 1   phentermine  37.5 MG capsule Take 1 capsule (37.5 mg total) by mouth every morning. 30 capsule 1   vitamin C (ASCORBIC ACID) 500 MG tablet  Take 500 mg by mouth daily.     No facility-administered medications prior to visit.    Allergies  Allergen Reactions   Azelastine Itching and Anaphylaxis   Iodinated Contrast Media Other (See Comments)   Sulfa Antibiotics Hives    Review of Systems  Constitutional:  Negative for appetite change, fatigue and fever.  HENT:  Positive for congestion. Negative for ear pain, sinus pressure and sore throat.   Respiratory:  Positive for cough. Negative for chest tightness, shortness of breath and wheezing.   Cardiovascular:  Negative for chest pain and palpitations.  Gastrointestinal:  Negative for abdominal pain, constipation, diarrhea, nausea and vomiting.  Genitourinary:  Negative for dysuria and hematuria.  Musculoskeletal:  Negative for arthralgias, back pain, joint swelling and myalgias.  Skin:  Negative for rash.  Neurological:  Negative for dizziness, weakness and headaches.  Psychiatric/Behavioral:  Negative for dysphoric mood. The patient is not nervous/anxious.        Objective:         04/28/2024   10:11 AM 04/06/2024    3:18 PM 03/31/2024    3:53 PM  Vitals with BMI  Height 5' 2 5' 2 5' 2  Weight 197 lbs 195 lbs 13 oz 194 lbs  BMI 36.02 35.8 35.47  Systolic 148 130 952  Diastolic 108 86 80  Pulse 83 89 84    Orthostatic VS for the past 72 hrs (Last 3 readings):  Patient Position BP Location  04/28/24 1011 Sitting Left Arm     Physical Exam Vitals reviewed.  Constitutional:      Appearance: Normal appearance.  Cardiovascular:     Rate and Rhythm: Normal rate and regular rhythm.     Heart sounds: Normal heart sounds.  Pulmonary:     Effort: Pulmonary effort is normal.     Breath sounds: Wheezing and rhonchi present.  Abdominal:     General: Bowel sounds are normal.     Palpations: Abdomen is soft.     Tenderness: There is no abdominal tenderness.  Neurological:     Mental Status: She is alert and oriented to person, place, and time.  Psychiatric:         Mood and Affect: Mood normal.        Behavior: Behavior normal.     There are no preventive care reminders to display for this patient.  There are no preventive care reminders to display for this patient.   Lab Results  Component Value Date   TSH 2.420 11/20/2023   Lab Results  Component Value Date   WBC 11.7 (H) 11/20/2023   HGB 14.9 11/20/2023   HCT 47.0 (H) 11/20/2023   MCV 91 11/20/2023   PLT 354 11/20/2023   Lab Results  Component Value Date   NA 141 11/20/2023   K 4.8 11/20/2023   CO2 22 11/20/2023   GLUCOSE 66 (L) 11/20/2023   BUN 11 11/20/2023   CREATININE 0.84 11/20/2023   BILITOT 0.5 11/20/2023   ALKPHOS 99 11/20/2023   AST 21 11/20/2023   ALT 22 11/20/2023   PROT 7.2 11/20/2023   ALBUMIN 4.3 11/20/2023   CALCIUM 10.4 (H) 11/20/2023   EGFR 78 11/20/2023   Lab Results  Component Value Date   CHOL 196 11/20/2023   Lab Results  Component Value Date   HDL 73 11/20/2023   Lab Results  Component Value Date   LDLCALC 104 (H) 11/20/2023   Lab Results  Component Value Date   TRIG 110 11/20/2023   Lab Results  Component Value Date   CHOLHDL 2.7 11/20/2023   No results found for: HGBA1C    Total time spent on today's visit was 30 minutes, including both face-to-face time and nonface-to-face time personally spent on review of chart (labs and imaging), discussing labs and goals, discussing further work-up, treatment options, referrals to specialist if needed, reviewing outside records of pertinent, answering patient's questions, and coordinating care.  Assessment & Plan:  Subacute cough Assessment & Plan: Cough persists despite initial antibiotics. No fever, nasal drainage affects sleep. Lungs clear except slight crackling in left  lower lobe. Differential includes atypical pneumonia. Previous antibiotics may have been insufficient. - Order chest x-ray to rule out atypical pneumonia. - Prescribe doxycycline  for 10 days. - Consider azithromycin   if doxycycline  ineffective. - Advise diphenhydramine at night for nasal drainage. - Discuss prednisone  for sinus drainage if needed.  Orders: -     Doxycycline  Hyclate; Take 1 tablet (100 mg total) by mouth 2 (two) times daily.  Dispense: 20 tablet; Refill: 0 -     DG Chest 2 View; Future  Benign hypertension Assessment & Plan: Blood pressure elevated, possibly due to illness. Headaches and blurry vision likely related. Currently on losartan , not taking intramine. - Monitor blood pressure and symptoms as illness resolves. - Reassess medication adjustment if blood pressure remains elevated post-recovery.     Immune System Support Reports frequent illnesses, feels immunocompromised. Currently taking vitamin C and D. Discussed immune support strategies. - Advise maintaining hydration and balanced diet. - Discuss use of liquid IV for hydration. - Consider elderberry supplements, though evidence is limited.      Meds ordered this encounter  Medications   doxycycline  (VIBRA -TABS) 100 MG tablet    Sig: Take 1 tablet (100 mg total) by mouth 2 (two) times daily.    Dispense:  20 tablet    Refill:  0    Orders Placed This Encounter  Procedures   DG Chest 2 View     Follow-up: Return if symptoms worsen or fail to improve.  An After Visit Summary was printed and given to the patient.    I,Lauren M Auman,acting as a Neurosurgeon for [employer], [employer].,have documented all relevant documentation on the behalf of Odilia Bennett, PA,as directed by  Odilia Bennett, PA while in the presence of Odilia Bennett, [place].   Odilia Bennett, [place] [facility] [phone]

## 2024-04-28 NOTE — Assessment & Plan Note (Signed)
 Cough persists despite initial antibiotics. No fever, nasal drainage affects sleep. Lungs clear except slight crackling in left lower lobe. Differential includes atypical pneumonia. Previous antibiotics may have been insufficient. - Order chest x-ray to rule out atypical pneumonia. - Prescribe doxycycline  for 10 days. - Consider azithromycin  if doxycycline  ineffective. - Advise diphenhydramine at night for nasal drainage. - Discuss prednisone  for sinus drainage if needed.

## 2024-04-28 NOTE — Patient Instructions (Signed)
 VISIT SUMMARY:  During your visit, we discussed your persistent cough, which has not improved despite initial treatments. We also reviewed your elevated blood pressure and strategies to support your immune system.  YOUR PLAN:  -PERSISTENT COUGH: A persistent cough can be caused by various factors, including infections or irritants. We will perform a chest x-ray to rule out atypical pneumonia and have prescribed doxycycline  for 10 days. If doxycycline  is not effective, we may consider azithromycin . To help with nasal drainage at night, you can take diphenhydramine. If needed, we may discuss using prednisone  for sinus drainage.  -HYPERTENSION: Hypertension, or high blood pressure, can be influenced by illness. Your blood pressure is currently elevated, likely due to your current symptoms. We will monitor your blood pressure and symptoms as you recover. If your blood pressure remains high after you feel better, we will reassess your medication.  -IMMUNE SYSTEM SUPPORT: Supporting your immune system can help reduce the frequency of illnesses. You are already taking vitamin C and D. We recommend maintaining good hydration and a balanced diet. You may also consider using liquid IV for hydration and elderberry supplements, although the evidence for elderberry is limited.  INSTRUCTIONS:  Please complete the course of doxycycline  as prescribed. Take diphenhydramine at night to help with nasal drainage. Monitor your blood pressure and symptoms, and let us  know if your blood pressure remains elevated after you recover. Stay hydrated and maintain a balanced diet. Consider using liquid IV for hydration and elderberry supplements if desired. Follow up with us  if your symptoms do not improve or if you have any concerns.

## 2024-04-30 ENCOUNTER — Ambulatory Visit: Payer: Self-pay | Admitting: Physician Assistant

## 2024-05-12 ENCOUNTER — Ambulatory Visit: Payer: Self-pay

## 2024-05-12 ENCOUNTER — Other Ambulatory Visit: Payer: Self-pay | Admitting: Family Medicine

## 2024-05-12 MED ORDER — FLUCONAZOLE 150 MG PO TABS: 150.0000 mg | ORAL_TABLET | Freq: Once | ORAL | 0 refills | Status: AC

## 2024-05-12 NOTE — Telephone Encounter (Signed)
  FYI Only or Action Required?: Action required by provider: wants medication for vaginal discharge post antibiotic use.  Patient was last seen in primary care on 04/28/2024 by Milon Cleaves, PA. Called Nurse Triage reporting Vaginal Discharge. Symptoms began several days ago. Interventions attempted: Nothing. Symptoms are: unchanged.  Triage Disposition: See PCP When Office is Open (Within 3 Days)  Patient/caregiver understands and will follow disposition?: No, refuses disposition     Copied from CRM [id number]. Topic: Clinical - Red Word Triage >> May 12, 2024  9:47 AM Larissa RAMAN wrote: [facility] that prompted transfer to Nurse Triage: vaginal discharge Reason for Disposition  Bad smelling vaginal discharge  Answer Assessment - Initial Assessment Questions 1. DISCHARGE: Describe the discharge. (e.g., white, yellow, green, gray, foamy, cottage cheese-like)     itch 2. ODOR: Is there a bad odor?     Can't smell or taste 3. ONSET: When did the discharge begin?     This weekend and finished last dose of antibiotic last week.  Has also taken monostat x 3 and it has not helped.  4. RASH: Is there a rash in the genital area? If Yes, ask: Describe it. (e.g., redness, blisters, sores, bumps)     no 5. ABDOMEN PAIN: Are you having any abdomen pain? If Yes, ask: What does it feel like?  (e.g., crampy, dull, intermittent, constant)      no 6. ABDOMEN PAIN SEVERITY: If present, ask: How bad is it? (e.g., Scale 1-10; mild, moderate, or severe)   - MILD (1-3): Doesn't interfere with normal activities, abdomen soft and not tender to touch.    - MODERATE (4-7): Interferes with normal activities or awakens from sleep, abdomen tender to touch.    - SEVERE (8-10): Excruciating pain, doubled over, unable to do any normal activities. (R/O peritonitis)      severe 7. CAUSE: What do you think is causing the discharge? Have you had the same problem before? What happened then?     Yeast  infection 8. OTHER SYMPTOMS: Do you have any other symptoms? (e.g., fever, itching, vaginal bleeding, pain with urination, injury to genital area, vaginal foreign body)     no 9. PREGNANCY: Is there any chance you are pregnant? When was your last menstrual period?     na  Protocols used: Vaginal Discharge-A-AH

## 2024-06-07 ENCOUNTER — Ambulatory Visit: Payer: Self-pay | Admitting: Physician Assistant

## 2024-06-10 ENCOUNTER — Other Ambulatory Visit: Payer: Self-pay | Admitting: Physician Assistant

## 2024-06-14 ENCOUNTER — Encounter: Payer: Self-pay | Admitting: Physician Assistant

## 2024-06-14 ENCOUNTER — Ambulatory Visit (INDEPENDENT_AMBULATORY_CARE_PROVIDER_SITE_OTHER): Payer: Self-pay | Admitting: Physician Assistant

## 2024-06-14 VITALS — BP 130/80 | HR 96 | Temp 97.4°F | Resp 16 | Ht 62.0 in | Wt 194.0 lb

## 2024-06-14 DIAGNOSIS — E782 Mixed hyperlipidemia: Secondary | ICD-10-CM

## 2024-06-14 DIAGNOSIS — I10 Essential (primary) hypertension: Secondary | ICD-10-CM

## 2024-06-14 DIAGNOSIS — R052 Subacute cough: Secondary | ICD-10-CM | POA: Diagnosis not present

## 2024-06-14 DIAGNOSIS — E559 Vitamin D deficiency, unspecified: Secondary | ICD-10-CM

## 2024-06-14 DIAGNOSIS — Z0184 Encounter for antibody response examination: Secondary | ICD-10-CM

## 2024-06-14 DIAGNOSIS — K219 Gastro-esophageal reflux disease without esophagitis: Secondary | ICD-10-CM | POA: Diagnosis not present

## 2024-06-14 MED ORDER — MONTELUKAST SODIUM 10 MG PO TABS: 10.0000 mg | ORAL_TABLET | Freq: Every day | ORAL | 3 refills | Status: DC

## 2024-06-14 NOTE — Progress Notes (Signed)
 Subjective:  Patient ID: Desiree Holmes, female    DOB: [birth year]  Age: 63 y.o. MRN: [id number]  Chief Complaint  Patient presents with   Medical Management of Chronic Issues    Hypertension    Pt presents for follow up of hypertension. The patient is tolerating the medication well without side effects. Compliance with treatment has been good; including taking medication as directed , maintains a healthy diet and regular exercise regimen , and following up as directed. 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     cetirizine (ZYRTEC) 10 MG tablet Take 10 mg by mouth daily.     estradiol  (ESTRACE ) 1 MG tablet TAKE 1 TABLET(1 MG) BY MOUTH DAILY 90 tablet 3   losartan  (COZAAR ) 100 MG tablet TAKE 1 TABLET(100 MG) BY MOUTH DAILY 90 tablet 1   Omega-3 Fatty Acids (FISH OIL) 1000 MG CAPS Take by mouth.     omeprazole  (PRILOSEC) 40 MG capsule TAKE 1 CAPSULE BY MOUTH EVERY DAY 90 capsule 1   vitamin C (ASCORBIC ACID) 500 MG tablet Take 500 mg by mouth daily.     No current facility-administered medications on file prior to visit.   Past Medical History:  Diagnosis Date   Barrett's esophagus    H. pylori infection    History of colon polyps    Past Surgical History:  Procedure Laterality Date   ABDOMINAL HYSTERECTOMY     with BSO   CESAREAN SECTION     x2   DIAGNOSTIC LAPAROSCOPY     DILATION AND CURETTAGE OF UTERUS      MINI LAP STERILIZATION     TOTAL ABDOMINAL HYSTERECTOMY W/ BILATERAL SALPINGOOPHORECTOMY     TUBOPLASTY / TUBOTUBAL ANASTOMOSIS      Family History  Problem Relation Age of Onset   COPD Father    Cancer Father        STOMACH   Diabetes Mother    Hypertension Mother    Thyroid disease Mother    Lupus Sister    Diabetes Maternal Grandmother    Heart disease Maternal Grandfather    Heart disease Paternal Grandfather    Cancer Paternal Grandfather        COLON   Breast cancer Neg Hx    Social History   Socioeconomic History   Marital status: Married    Spouse name: Not on file   Number of children: 1   Years of education: Not on file   Highest education level: Some college, no degree  Occupational History   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  Tobacco Use   Smoking status: Never   Smokeless tobacco: Never  Vaping Use   Vaping status: Never Used  Substance and Sexual Activity   Alcohol use: No    Alcohol/week: 0.0 standard drinks of alcohol   Drug use: No   Sexual activity: Not Currently    Birth control/protection: Surgical  Comment: 1st intercourse 89 yo-5 partners  Other Topics Concern   Not on file  Social History Narrative   Not on file   Social Drivers of Health   Financial Resource Strain: Low Risk  (02/02/2024)   Overall Financial Resource Strain (CARDIA)    Difficulty of Paying Living Expenses: Not hard at all  Food Insecurity: No Food Insecurity (02/02/2024)   Hunger Vital Sign    Worried About Running Out of Food in the Last Year: Never true    Ran Out of Food in the Last Year: Never true  Transportation Needs: No Transportation Needs (02/02/2024)   PRAPARE - Administrator, Civil Service (Medical): No    Lack of Transportation (Non-Medical): No  Physical Activity: Unknown (02/02/2024)   Exercise Vital Sign    Days of Exercise per Week: 5 days    Minutes of Exercise per Session: Patient declined  Stress: No Stress Concern Present (02/02/2024)    [employer] of Occupational Health - Occupational Stress Questionnaire    Feeling of Stress : Only a little  Social Connections: Moderately Integrated (02/02/2024)   Social Connection and Isolation Panel    Frequency of Communication with Friends and Family: Three times a week    Frequency of Social Gatherings with Friends and Family: Twice a week    Attends Religious Services: More than 4 times per year    Active Member of [employer] or Organizations: No    Attends Engineer, structural: Patient declined    Marital Status: Married   CONSTITUTIONAL: Negative for chills, fatigue, fever, unintentional weight gain and unintentional weight loss.  E/N/T: see HPI CARDIOVASCULAR: Negative for chest pain, dizziness, palpitations and pedal edema.  RESPIRATORY: see HPI GASTROINTESTINAL: Negative for abdominal pain, acid reflux symptoms, constipation, diarrhea, nausea and vomiting.  MSK: Negative for arthralgias and myalgias.  INTEGUMENTARY: Negative for rash.  NEUROLOGICAL: Negative for dizziness and headaches.  PSYCHIATRIC: Negative for sleep disturbance and to question depression screen.  Negative for depression, negative for anhedonia.       Objective:  PHYSICAL EXAM:   VS: BP 130/80   Pulse 96   Temp (!) 97.4 F (36.3 C)   Resp 16   Ht 5' 2 (1.575 m)   Wt 194 lb (88 kg)   SpO2 97%   BMI 35.48 kg/m   GEN: Well nourished, well developed, in no acute distress  HEENT: normal external ears and nose - normal external auditory canals and TMS - - Lips, Teeth and Gums - normal  Oropharynx - normal mucosa, palate, and posterior pharynx Cardiac: RRR; no murmurs, rubs, or gallops,no edema - Respiratory:  normal respiratory rate and pattern with no distress - normal breath sounds with no rales, rhonchi, wheezes or rubs  MS: no deformity or atrophy  Skin: warm and dry, no rash  Neuro:  Alert and Oriented x 3, - CN II-Xii grossly intact Psych: euthymic mood, appropriate affect and  demeanor     Lab Results  Component Value Date   WBC 11.7 (H) 11/20/2023   HGB 14.9 11/20/2023   HCT 47.0 (H) 11/20/2023   PLT 354 11/20/2023   GLUCOSE 66 (L) 11/20/2023   CHOL 196 11/20/2023   TRIG 110 11/20/2023   HDL 73 11/20/2023   LDLCALC 104 (H) 11/20/2023   ALT 22 11/20/2023   AST 21 11/20/2023   NA 141 11/20/2023   K 4.8 11/20/2023   CL 99 11/20/2023   CREATININE 0.84 11/20/2023   BUN 11  11/20/2023   CO2 22 11/20/2023   TSH 2.420 11/20/2023      Assessment & Plan:   Problem List Items Addressed This Visit       Cardiovascular and Mediastinum   Benign hypertension - Primary Continue current meds     Other   GERD Continue prilosec as directed      Hyperlipidemia Continue fish oil  Subacute cough Rx singulair  10mg  qd     Vit D insuff Vit D pending  Titer for immunity for MMR   Other Visit Diagnoses       .  Meds ordered this encounter  Medications   montelukast  (SINGULAIR ) 10 MG tablet    Sig: Take 1 tablet (10 mg total) by mouth at bedtime.    Dispense:  30 tablet    Refill:  3    Supervising Provider:   COX, KIRSTEN [phone]    Orders Placed This Encounter  Procedures   CBC with Differential/Platelet   Comprehensive metabolic panel with GFR   TSH   Lipid panel   VITAMIN D  25 Hydroxy (Vit-D Deficiency, Fractures)   Measles/Mumps/Rubella Immunity     Follow-up: Return in about 3 months (around 09/14/2024) for welcome MCR with me - 40 min --- this week for lab.  An After Visit Summary was printed and given to the patient.  CAMIE JONELLE NICHOLAUS DEVONNA [facility] [phone]

## 2024-06-15 ENCOUNTER — Other Ambulatory Visit

## 2024-06-15 DIAGNOSIS — I10 Essential (primary) hypertension: Secondary | ICD-10-CM | POA: Diagnosis not present

## 2024-06-15 DIAGNOSIS — E559 Vitamin D deficiency, unspecified: Secondary | ICD-10-CM | POA: Diagnosis not present

## 2024-06-15 DIAGNOSIS — E782 Mixed hyperlipidemia: Secondary | ICD-10-CM

## 2024-06-15 DIAGNOSIS — Z0184 Encounter for antibody response examination: Secondary | ICD-10-CM

## 2024-06-16 ENCOUNTER — Ambulatory Visit: Payer: Self-pay | Admitting: Physician Assistant

## 2024-06-16 LAB — MEASLES/MUMPS/RUBELLA IMMUNITY
MUMPS ABS, IGG: 23.4 [AU]/ml (ref >10.9)
RUBEOLA AB, IGG: 182 [AU]/ml (ref >16.4)
Rubella Antibodies, IGG: 4.48 {index} (ref >0.99)

## 2024-06-16 LAB — CBC WITH DIFFERENTIAL/PLATELET
Basophils Absolute: 0 x10E3/uL (ref 0.0–0.2)
Basos: 0 %
EOS (ABSOLUTE): 0.1 x10E3/uL (ref 0.0–0.4)
Eos: 1 %
Hematocrit: 45.6 % (ref 34.0–46.6)
Hemoglobin: 14.6 g/dL (ref 11.1–15.9)
Immature Grans (Abs): 0 x10E3/uL (ref 0.0–0.1)
Immature Granulocytes: 0 %
Lymphocytes Absolute: 3.2 x10E3/uL — ABNORMAL HIGH (ref 0.7–3.1)
Lymphs: 34 %
MCH: 29.6 pg (ref 26.6–33.0)
MCHC: 32 g/dL (ref 31.5–35.7)
MCV: 93 fL (ref 79–97)
Monocytes Absolute: 0.5 x10E3/uL (ref 0.1–0.9)
Monocytes: 5 %
Neutrophils Absolute: 5.5 x10E3/uL (ref 1.4–7.0)
Neutrophils: 60 %
Platelets: 309 x10E3/uL (ref 150–450)
RBC: 4.93 x10E6/uL (ref 3.77–5.28)
RDW: 13 % (ref 11.7–15.4)
WBC: 9.4 x10E3/uL (ref 3.4–10.8)

## 2024-06-16 LAB — LIPID PANEL
Chol/HDL Ratio: 3.2 ratio (ref 0.0–4.4)
Cholesterol, Total: 183 mg/dL (ref 100–199)
HDL: 58 mg/dL (ref >39)
LDL Chol Calc (NIH): 107 mg/dL — ABNORMAL HIGH (ref 0–99)
Triglycerides: 97 mg/dL (ref 0–149)
VLDL Cholesterol Cal: 18 mg/dL (ref 5–40)

## 2024-06-16 LAB — COMPREHENSIVE METABOLIC PANEL WITH GFR
ALT: 19 IU/L (ref 0–32)
AST: 19 IU/L (ref 0–40)
Albumin: 4.4 g/dL (ref 3.9–4.9)
Alkaline Phosphatase: 87 IU/L (ref 44–121)
BUN/Creatinine Ratio: 21 (ref 12–28)
BUN: 15 mg/dL (ref 8–27)
Bilirubin Total: 0.8 mg/dL (ref 0.0–1.2)
CO2: 19 mmol/L — ABNORMAL LOW (ref 20–29)
Calcium: 9.6 mg/dL (ref 8.7–10.3)
Chloride: 103 mmol/L (ref 96–106)
Creatinine, Ser: 0.7 mg/dL (ref 0.57–1.00)
Globulin, Total: 2.3 g/dL (ref 1.5–4.5)
Glucose: 68 mg/dL — ABNORMAL LOW (ref 70–99)
Potassium: 4.1 mmol/L (ref 3.5–5.2)
Sodium: 139 mmol/L (ref 134–144)
Total Protein: 6.7 g/dL (ref 6.0–8.5)
eGFR: 96 mL/min/1.73 (ref >59)

## 2024-06-16 LAB — TSH: TSH: 1.39 u[IU]/mL (ref 0.450–4.500)

## 2024-06-16 LAB — VITAMIN D 25 HYDROXY (VIT D DEFICIENCY, FRACTURES): Vit D, 25-Hydroxy: 38.3 ng/mL (ref 30.0–100.0)

## 2024-07-06 ENCOUNTER — Telehealth: Payer: Self-pay

## 2024-07-06 NOTE — Telephone Encounter (Unsigned)
 Copied from CRM #[id number]. Topic: Clinical - Medication Question >> Jul 06, 2024  9:24 AM Martinique E wrote: Reason for CRM: Patient stated that at her last visit with PCP, they took her off of phentermine , but now that her blood pressure has returned back to normal, patient is questioning if she should be started back on this medication. Callback number [phone], OK to leave a VM.

## 2024-07-06 NOTE — Telephone Encounter (Signed)
 Patient Made Aware, Verbalized Understanding.

## 2024-07-06 NOTE — Telephone Encounter (Signed)
 Would not recommend to restart at this time but can discuss at next office visit In meantime make dietary changes/increase physical activity

## 2024-08-11 ENCOUNTER — Ambulatory Visit: Admitting: Podiatry

## 2024-08-11 DIAGNOSIS — L603 Nail dystrophy: Secondary | ICD-10-CM

## 2024-08-11 DIAGNOSIS — M2012 Hallux valgus (acquired), left foot: Secondary | ICD-10-CM

## 2024-08-11 DIAGNOSIS — L600 Ingrowing nail: Secondary | ICD-10-CM

## 2024-08-11 NOTE — Progress Notes (Signed)
 Chief Complaint  Patient presents with   Nail Problem    Left 1st nail was removed almost a year ago. Now it is growing back with a hump in the middle. She is unsure if it can be filed down or if it will need to be taken off again. She would also like her right 1st checked.    HPI: 63 y.o. female presents today noting that her great toenails were removed by Dr. Lamount approximately 1 year ago.  The right great toenail has some thickness that has her concerned but she is not having any pain to that toe.  Her left great toenail seems to be splitting down the middle and she notes it was not like this previously.  She denies any recent injury to the toenail.  She also notes curving in of the left second toenail.  This is mostly along the medial border where she points.  Past Medical History:  Diagnosis Date   Barrett's esophagus    H. pylori infection    History of colon polyps    Past Surgical History:  Procedure Laterality Date   ABDOMINAL HYSTERECTOMY     with BSO   CESAREAN SECTION     x2   DIAGNOSTIC LAPAROSCOPY     DILATION AND CURETTAGE OF UTERUS     MINI LAP STERILIZATION     TOTAL ABDOMINAL HYSTERECTOMY W/ BILATERAL SALPINGOOPHORECTOMY     TUBOPLASTY / TUBOTUBAL ANASTOMOSIS     Allergies  Allergen Reactions   Azelastine Itching and Anaphylaxis   Iodinated Contrast Media Other (See Comments)   Sulfa Antibiotics Hives    Physical Exam: Palpable pedal pulses.  No open lesions are noted.  There is incurvation of the left second toenail along the medial border.  The right hallux nail appears to be approximately 70% grown out at this time.  There is mild thickness at the distal edge of the nail.  No pain on palpation of the nail.  The left hallux nail does have longitudinal splitting near the central portion which extends almost to the eponychium.  There is minimal to no pain on palpation of this area.  There are some rough edges present.  No surrounding erythema is noted.   Epicritic sensation is intact.  There is mild lateral angulation of the left hallux which is manually reducible with manipulation.  Assessment/Plan of Care: 1. Nail dystrophy   2. Ingrown nail   3. Hallux valgus, left    The bilateral hallux nails were debrided to decrease thickness and help smooth any rough edges.  Recommended 4-in-1 nail strengthener to try on the left hallux nail as it regrows.  She can also try Optinail solution or urea nail gel 47%.  Discussed a PNA procedure to the left second toenail along the medial border for the slight pincer nail deformity if she continues to get pain along the medial side.  Informed her that the hallux valgus deformity causes the great toe to push against the medial side of the second toe which will in turn start to cause the nail to curve inward more.  Follow-up as needed   Awanda CHARM Imperial, DPM, FACFAS [facility]     [address], [place] [address]  Office [phone]  Fax [phone]

## 2024-08-26 ENCOUNTER — Ambulatory Visit: Admitting: Physician Assistant

## 2024-08-26 ENCOUNTER — Inpatient Hospital Stay: Admitting: Physician Assistant

## 2024-08-26 ENCOUNTER — Ambulatory Visit: Payer: Self-pay

## 2024-08-26 DIAGNOSIS — I498 Other specified cardiac arrhythmias: Secondary | ICD-10-CM | POA: Diagnosis not present

## 2024-08-26 DIAGNOSIS — I160 Hypertensive urgency: Secondary | ICD-10-CM | POA: Diagnosis not present

## 2024-08-26 DIAGNOSIS — R040 Epistaxis: Secondary | ICD-10-CM | POA: Diagnosis not present

## 2024-08-26 NOTE — Telephone Encounter (Signed)
 FYI Only or Action Required?: FYI only for provider. Nurse sent pt to ER now & pt verbalized understanding.  Patient was last seen in primary care on 06/14/2024 by Nicholaus Credit, PA-C.  Called Nurse Triage reporting Bleeding/Bruising.  Symptoms began yesterday.  Interventions attempted: Nothing.  Symptoms are: unknown.  Triage Disposition: Go to ED Now (Notify PCP)  Patient/caregiver understands and will follow disposition?: Yes   Copied from CRM [id number]. Topic: Clinical - Red Word Triage >> Aug 26, 2024  7:38 AM Tiffini S wrote: [facility] that prompted transfer to Nurse Triage: right side nose bleed /  high blood pressure readings are  207/96 right arm and 197/97 left arm- started at the end of August Reason for Disposition  [1] Bleeding present > 30 minutes AND [2] using correct method of direct pressure  Answer Assessment - Initial Assessment Questions 1. AMOUNT OF BLEEDING: How bad is the bleeding? How much blood was lost? Has the bleeding stopped?     Moderate - bright red - took about 20-25 minutes to stop bleeding 2. ONSET: When did the nosebleed start?      August when nose started: yesterday nose bleed heavier 3. FREQUENCY: How many nosebleeds have you had in the last 24 hours?      often 4. RECURRENT SYMPTOMS: Have there been other recent nosebleeds? If Yes, ask: How long did it take you to stop the bleeding? What worked best?      yes 5. CAUSE: What do you think caused this nosebleed?     HTN 6. LOCAL FACTORS: Do you have any cold symptoms?, Have you been rubbing or picking at your nose?     no 7. SYSTEMIC FACTORS: Do you have high blood pressure or any bleeding problems?     na 8. BLOOD THINNERS: Do you take any blood thinners? (e.g., aspirin, clopidogrel / Plavix, coumadin, heparin). Notes: Other strong blood thinners include: Arixtra (fondaparinux), Eliquis (apixaban), Pradaxa (dabigatran), and Xarelto (rivaroxaban).     no 9. OTHER  SYMPTOMS: Do you have any other symptoms? (e.g., lightheadedness)     Right eye blood shot 10. PREGNANCY: Is there any chance you are pregnant? When was your last menstrual period?       na  Pt states at LOV BP was high but no changes to medications.  Currently takes BP medication and has not skipped any doses.  Right nostril only bleeds and this started the end of August.  Comes and goes at random times.  This time the right nostril bleeding.   Has not skipped taking any medication.  Pt does not have BP machine at home therefore unable to check BP for nurse.  Nurse recommended patient go to ER now.  Protocols used: Nosebleed-A-AH

## 2024-08-27 ENCOUNTER — Ambulatory Visit (INDEPENDENT_AMBULATORY_CARE_PROVIDER_SITE_OTHER): Admitting: Physician Assistant

## 2024-08-27 ENCOUNTER — Encounter: Payer: Self-pay | Admitting: Physician Assistant

## 2024-08-27 VITALS — BP 138/78 | HR 75 | Temp 97.6°F | Resp 18 | Ht 62.0 in | Wt 197.8 lb

## 2024-08-27 DIAGNOSIS — I10 Essential (primary) hypertension: Secondary | ICD-10-CM | POA: Diagnosis not present

## 2024-08-27 MED ORDER — DILTIAZEM HCL ER COATED BEADS 180 MG PO CP24: 180.0000 mg | ORAL_CAPSULE | Freq: Every day | ORAL | 1 refills | Status: DC

## 2024-08-27 NOTE — Progress Notes (Signed)
 Subjective:  Patient ID: Desiree Holmes, female    DOB: May 16, 1961  Age: 63 y.o. MRN: [id number]  Chief Complaint  Patient presents with   Medical Management of Chronic Issues    HPI  Pt in today for follow up of blood pressure.  Patient was seen yesterday at [facility] ED for hypertension and nosebleeds.  She has been on losartan  100mg  for awhile and had been doing well but has noted in the past several weeks she had been super tired and having nosebleeds.  She had a friend take her bp yesterday and it was more than 200/100.  She was seen at ED and had stable EKG and labwork and was given IV labatalol which brought her pressure down to 140s/80s before leaving.  She was told to follow up with us  today for management BP today stable at 136/75.  She denies chest pain, dyspnea, edema or palpitations    04/06/2024    3:19 PM 11/18/2023    8:14 AM 06/10/2023    2:14 PM 04/09/2023    3:42 PM 02/04/2023    8:16 AM  Depression screen PHQ 2/9  Decreased Interest 0 0 0 0 0  Down, Depressed, Hopeless 0 1 0 1 0  PHQ - 2 Score 0 1 0 1 0  Altered sleeping  1 2 2    Tired, decreased energy  1 1 1    Change in appetite  1 1 1    Feeling bad or failure about yourself   1 1 1    Trouble concentrating  0 0 0   Moving slowly or fidgety/restless  0 0 0   Suicidal thoughts  0 0 0   PHQ-9 Score  5 5 6    Difficult doing work/chores  Not difficult at all Not difficult at all Not difficult at all         02/04/2023    8:16 AM 04/09/2023    3:42 PM 06/10/2023    2:14 PM 11/18/2023    8:13 AM 03/31/2024    4:00 PM  Fall Risk  Falls in the past year? 0 0 0 0 0  Was there an injury with Fall? 0 0 0 0 0  Fall Risk Category Calculator 0 0 0 0 0  Patient at Risk for Falls Due to No Fall Risks No Fall Risks No Fall Risks No Fall Risks   Fall risk Follow up Falls evaluation completed Falls evaluation completed Falls evaluation completed Falls evaluation completed      ROS CONSTITUTIONAL: see HPI E/N/T:  Negative for ear pain, nasal congestion and sore throat.  CARDIOVASCULAR: Negative for chest pain, dizziness, palpitations and pedal edema.  RESPIRATORY: Negative for recent cough and dyspnea.  GASTROINTESTINAL: Negative for abdominal pain, acid reflux symptoms, constipation, diarrhea, nausea and vomiting.     Current Outpatient Medications:    Calcium Carb-Cholecalciferol (SM CALCIUM 500/VITAMIN D3 PO), Take by mouth., Disp: , Rfl:    cetirizine (ZYRTEC) 10 MG tablet, Take 10 mg by mouth daily., Disp: , Rfl:    diltiazem (CARDIZEM CD) 180 MG 24 hr capsule, Take 1 capsule (180 mg total) by mouth daily., Disp: 30 capsule, Rfl: 1   estradiol  (ESTRACE ) 1 MG tablet, TAKE 1 TABLET(1 MG) BY MOUTH DAILY, Disp: 90 tablet, Rfl: 3   losartan  (COZAAR ) 100 MG tablet, TAKE 1 TABLET(100 MG) BY MOUTH DAILY, Disp: 90 tablet, Rfl: 1   Omega-3 Fatty Acids (FISH OIL) 1000 MG CAPS, Take by mouth., Disp: , Rfl:    omeprazole  (  PRILOSEC) 40 MG capsule, TAKE 1 CAPSULE BY MOUTH EVERY DAY, Disp: 90 capsule, Rfl: 1   vitamin C (ASCORBIC ACID) 500 MG tablet, Take 500 mg by mouth daily., Disp: , Rfl:   Past Medical History:  Diagnosis Date   Allergy    Barrett's esophagus    GERD (gastroesophageal reflux disease)    H. pylori infection    History of colon polyps    Hypertension    Objective:  PHYSICAL EXAM:   BP 138/78   Pulse 75   Temp 97.6 F (36.4 C) (Temporal)   Resp 18   Ht 5' 2 (1.575 m)   Wt 197 lb 12.8 oz (89.7 kg)   SpO2 99%   BMI 36.18 kg/m    GEN: Well nourished, well developed, in no acute distress  Cardiac: RRR; no murmurs, rubs, or gallops,no edema -  Respiratory:  normal respiratory rate and pattern with no distress - normal breath sounds with no rales, rhonchi, wheezes or rubs GI: normal bowel sounds, no masses or tenderness Psych: euthymic mood, appropriate affect and demeanor  Assessment & Plan:    Benign hypertension -     dilTIAZem HCl ER Coated Beads; Take 1 capsule (180 mg  total) by mouth daily.  Dispense: 30 capsule; Refill: 1 Continue losartan  100mg  qd    Follow-up: Return in about 2 weeks (around 09/10/2024), or nurse visit blood pressure check.  An After Visit Summary was printed and given to the patient.  Desiree Holmes [facility] [phone]

## 2024-09-07 DIAGNOSIS — L820 Inflamed seborrheic keratosis: Secondary | ICD-10-CM | POA: Diagnosis not present

## 2024-09-07 DIAGNOSIS — L814 Other melanin hyperpigmentation: Secondary | ICD-10-CM | POA: Diagnosis not present

## 2024-09-08 ENCOUNTER — Encounter: Admitting: Physician Assistant

## 2024-09-09 ENCOUNTER — Ambulatory Visit: Payer: Self-pay

## 2024-09-09 NOTE — Telephone Encounter (Signed)
 FYI Only or Action Required?: FYI only for provider.  Patient was last seen in primary care on 08/27/2024 by Nicholaus Credit, PA-C.  Called Nurse Triage reporting Fever.  Symptoms began yesterday.  Interventions attempted: Nothing.  Symptoms are: gradually worsening.  Triage Disposition: Call PCP Within 24 Hours  Patient/caregiver understands and will follow disposition?: Yes     Copied from CRM #[id number]. Topic: Clinical - Red Word Triage >> Sep 09, 2024  4:15 PM Fonda T wrote: [facility] that prompted transfer to Nurse Triage: Patient calling, states she does not feel well, had to leave work early due to not feeling well.   Reports feeling feverish, but with no fever, also fatigue.   Patient is requesting an appointment for evaluation. Reason for Disposition  [1] Fever > 100 F (37.8 C) AND [2] foreign travel to a developing country in the last month  Answer Assessment - Initial Assessment Questions 1. TEMPERATURE: What is the most recent temperature?  How was it measured?      Did not measure  temp 2. ONSET: When did the fever start?      yesterday 3. CHILLS: Do you have chills? If yes: How bad are they?  (e.g., none, mild, moderate, severe)     moderate 4. OTHER SYMPTOMS: Do you have any other symptoms besides the fever?  (e.g., abdomen pain, cough, diarrhea, earache, headache, sore throat, urination pain)     Feels, fatigue, overall not feeling, cough x1 5. CAUSE: If there are no symptoms, ask: What do you think is causing the fever?      Unsure - but usually this means respiratory issues about to flare up 6. CONTACTS: Does anyone else in the family have an infection?     no 7. TREATMENT: What have you done so far to treat this fever? (e.g., OTC fever medicines)     none 8. IMMUNOCOMPROMISE: Do you have any of the following: diabetes, HIV positive, splenectomy, cancer chemotherapy, chronic steroid treatment, transplant patient, etc.?     no 9. PREGNANCY:  Is there any chance you are pregnant? When was your last menstrual period?     no 10. TRAVEL: Have you traveled out of the country in the last month? (e.g., travel history, exposures)       Na  Protocols used: [facility]

## 2024-09-10 ENCOUNTER — Ambulatory Visit

## 2024-09-10 ENCOUNTER — Ambulatory Visit: Payer: Self-pay | Admitting: Physician Assistant

## 2024-09-10 ENCOUNTER — Encounter: Payer: Self-pay | Admitting: Physician Assistant

## 2024-09-10 ENCOUNTER — Ambulatory Visit: Admitting: Physician Assistant

## 2024-09-10 VITALS — BP 110/60 | HR 64 | Temp 97.2°F | Resp 16 | Ht 62.0 in | Wt 196.0 lb

## 2024-09-10 DIAGNOSIS — J069 Acute upper respiratory infection, unspecified: Secondary | ICD-10-CM

## 2024-09-10 LAB — POC COVID19 BINAXNOW: SARS Coronavirus 2 Ag: NEGATIVE

## 2024-09-10 LAB — POCT INFLUENZA A/B
Influenza A, POC: NEGATIVE
Influenza B, POC: NEGATIVE

## 2024-09-10 MED ORDER — PREDNISONE 20 MG PO TABS: ORAL_TABLET | ORAL | 0 refills | Status: AC

## 2024-09-10 MED ORDER — ALBUTEROL SULFATE HFA 108 (90 BASE) MCG/ACT IN AERS: 2.0000 | INHALATION_SPRAY | Freq: Four times a day (QID) | RESPIRATORY_TRACT | 2 refills | Status: DC | PRN

## 2024-09-10 NOTE — Progress Notes (Signed)
 Patient came in for BP follow up and recheck was normal so told the patient to follow up as scheduled.

## 2024-09-10 NOTE — Progress Notes (Signed)
 Acute Office Visit  Subjective:    Patient ID: Desiree Holmes, female    DOB: 1961-06-09, 63 y.o.   MRN: [id number]  Chief Complaint  Patient presents with   Rash    HPI: Patient is in today for chills, headache.   Discussed the use of AI scribe software for clinical note transcription with the patient, who gave verbal consent to proceed.  History of Present Illness Desiree Holmes is a 63 year old female who presents with fever, chills, and sinus congestion.  She began experiencing fever and chills yesterday at 10 AM. Despite usually sweating at work, she felt cold instead. She describes a heavy sensation in her head, particularly at the top, but denies sinus congestion or sore throat. No nausea or vomiting is present.  She has a history of upper respiratory infections that typically progress to bronchitis annually, requiring x-rays. She does not use inhalers and has not been sick recently, nor has anyone else in her household, including her husband.  She works at a daycare, which she acknowledges as a potential source of exposure to illnesses. She has a history of small allergies but reports they have not been particularly bad lately. She recently had a dermatological procedure to freeze off a lesion, which was noted by the dermatologist as not being a rash.  In terms of lifestyle, she consumes a significant amount of caffeine daily, including one cup of coffee and Dr. Nunzio in the afternoon. She denies that her current symptoms resemble a caffeine headache, describing the sensation as more of a heaviness rather than pain.  No dizziness, sore throat, or pain in the sinuses. She has been resting at home since yesterday, sleeping and staying hydrated.       Past Medical History:  Diagnosis Date   Allergy    Barrett's esophagus    GERD (gastroesophageal reflux disease)    H. pylori infection    History of colon polyps    Hypertension     Past Surgical  History:  Procedure Laterality Date   ABDOMINAL HYSTERECTOMY     with BSO   CESAREAN SECTION     x2   DIAGNOSTIC LAPAROSCOPY     DILATION AND CURETTAGE OF UTERUS     MINI LAP STERILIZATION     TOTAL ABDOMINAL HYSTERECTOMY W/ BILATERAL SALPINGOOPHORECTOMY     TUBOPLASTY / TUBOTUBAL ANASTOMOSIS      Family History  Problem Relation Age of Onset   COPD Father    Cancer Father        STOMACH   Diabetes Mother    Hypertension Mother    Thyroid disease Mother    Lupus Sister    Diabetes Maternal Grandmother    Heart disease Maternal Grandfather    Heart disease Paternal Grandfather    Cancer Paternal Grandfather        COLON   Breast cancer Neg Hx     Social History   Socioeconomic History   Marital status: Married    Spouse name: Not on file   Number of children: 1   Years of education: Not on file   Highest education level: Some college, no degree  Occupational History   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  Tobacco Use   Smoking status: Never   Smokeless tobacco: Never  Vaping Use   Vaping status: Never Used  Substance and Sexual Activity   Alcohol use: No    Alcohol/week: 0.0 standard drinks of alcohol   Drug use: No  Sexual activity: Not Currently    Birth control/protection: Surgical    Comment: 1st intercourse 44 yo-5 partners  Other Topics Concern   Not on file  Social History Narrative   Not on file   Social Drivers of Health   Financial Resource Strain: Low Risk  (02/02/2024)   Overall Financial Resource Strain (CARDIA)    Difficulty of Paying Living Expenses: Not hard at all  Food Insecurity: No Food Insecurity (02/02/2024)   Hunger Vital Sign    Worried About Running Out of Food in the Last Year: Never true    Ran Out of Food in the Last Year: Never true  Transportation Needs: No Transportation Needs (02/02/2024)   PRAPARE - Administrator, Civil Service (Medical): No    Lack of Transportation (Non-Medical): No  Physical Activity: Unknown  (02/02/2024)   Exercise Vital Sign    Days of Exercise per Week: 5 days    Minutes of Exercise per Session: Patient declined  Stress: No Stress Concern Present (02/02/2024)   [employer] of Occupational Health - Occupational Stress Questionnaire    Feeling of Stress : Only a little  Social Connections: Moderately Integrated (02/02/2024)   Social Connection and Isolation Panel    Frequency of Communication with Friends and Family: Three times a week    Frequency of Social Gatherings with Friends and Family: Twice a week    Attends Religious Services: More than 4 times per year    Active Member of [employer] or Organizations: No    Attends Banker Meetings: Patient declined    Marital Status: Married  Catering manager Violence: Not At Risk (06/10/2023)   Humiliation, Afraid, Rape, and Kick questionnaire    Fear of Current or Ex-Partner: No    Emotionally Abused: No    Physically Abused: No    Sexually Abused: No    Outpatient Medications Prior to Visit  Medication Sig Dispense Refill   Calcium Carb-Cholecalciferol (SM CALCIUM 500/VITAMIN D3 PO) Take by mouth.     cetirizine (ZYRTEC) 10 MG tablet Take 10 mg by mouth daily.     diltiazem (CARDIZEM CD) 180 MG 24 hr capsule Take 1 capsule (180 mg total) by mouth daily. 30 capsule 1   estradiol  (ESTRACE ) 1 MG tablet TAKE 1 TABLET(1 MG) BY MOUTH DAILY 90 tablet 3   losartan  (COZAAR ) 100 MG tablet TAKE 1 TABLET(100 MG) BY MOUTH DAILY 90 tablet 1   Omega-3 Fatty Acids (FISH OIL) 1000 MG CAPS Take by mouth.     omeprazole  (PRILOSEC) 40 MG capsule TAKE 1 CAPSULE BY MOUTH EVERY DAY 90 capsule 1   vitamin C (ASCORBIC ACID) 500 MG tablet Take 500 mg by mouth daily.     No facility-administered medications prior to visit.    Allergies  Allergen Reactions   Azelastine Itching and Anaphylaxis   Iodinated Contrast Media Other (See Comments)   Sulfa Antibiotics Hives    Review of Systems  Constitutional:  Positive for chills,  fatigue and fever. Negative for appetite change.  HENT:  Positive for ear pain (right ear). Negative for congestion, postnasal drip, sinus pressure, sinus pain and sore throat.   Respiratory:  Positive for cough (bedtime). Negative for chest tightness, shortness of breath and wheezing.   Cardiovascular:  Negative for chest pain.  Gastrointestinal:  Negative for abdominal pain, constipation, diarrhea and nausea.       Objective:        08/27/2024    8:51 AM 06/14/2024  2:55 PM 06/14/2024    2:27 PM  Vitals with BMI  Height 5' 2  5' 2  Weight 197 lbs 13 oz  194 lbs  BMI 36.17  35.47  Systolic 138 130 853  Diastolic 78 80 82  Pulse 75 96 105    No data found.   Physical Exam Vitals reviewed.  Constitutional:      Appearance: Normal appearance.  HENT:     Right Ear: A middle ear effusion is present. Tympanic membrane is bulging. Tympanic membrane is not erythematous.     Left Ear: A middle ear effusion is present. Tympanic membrane is bulging. Tympanic membrane is not erythematous.  Neck:     Vascular: No carotid bruit.  Cardiovascular:     Rate and Rhythm: Normal rate and regular rhythm.     Heart sounds: Normal heart sounds.  Pulmonary:     Effort: Pulmonary effort is normal.     Breath sounds: Wheezing present.  Abdominal:     General: Bowel sounds are normal.     Palpations: Abdomen is soft.     Tenderness: There is no abdominal tenderness.  Neurological:     Mental Status: She is alert and oriented to person, place, and time.  Psychiatric:        Mood and Affect: Mood normal.        Behavior: Behavior normal.     Health Maintenance Due  Topic Date Due   Medicare Annual Wellness (AWV)  Never done    There are no preventive care reminders to display for this patient.   Lab Results  Component Value Date   TSH 1.390 06/15/2024   Lab Results  Component Value Date   WBC 9.4 06/15/2024   HGB 14.6 06/15/2024   HCT 45.6 06/15/2024   MCV 93 06/15/2024    PLT 309 06/15/2024   Lab Results  Component Value Date   NA 139 06/15/2024   K 4.1 06/15/2024   CO2 19 (L) 06/15/2024   GLUCOSE 68 (L) 06/15/2024   BUN 15 06/15/2024   CREATININE 0.70 06/15/2024   BILITOT 0.8 06/15/2024   ALKPHOS 87 06/15/2024   AST 19 06/15/2024   ALT 19 06/15/2024   PROT 6.7 06/15/2024   ALBUMIN 4.4 06/15/2024   CALCIUM 9.6 06/15/2024   EGFR 96 06/15/2024   Lab Results  Component Value Date   CHOL 183 06/15/2024   Lab Results  Component Value Date   HDL 58 06/15/2024   Lab Results  Component Value Date   LDLCALC 107 (H) 06/15/2024   Lab Results  Component Value Date   TRIG 97 06/15/2024   Lab Results  Component Value Date   CHOLHDL 3.2 06/15/2024   No results found for: HGBA1C      Results for orders placed or performed in visit on 06/15/24  Measles/Mumps/Rubella Immunity   Collection Time: 06/15/24  7:52 AM  Result Value Ref Range   Rubella Antibodies, IGG 4.48 Immune >0.99 index   RUBEOLA AB, IGG 182.0 Immune >16.4 AU/mL   MUMPS ABS, IGG 23.4 Immune >10.9 AU/mL  VITAMIN D  25 Hydroxy (Vit-D Deficiency, Fractures)   Collection Time: 06/15/24  7:52 AM  Result Value Ref Range   Vit D, 25-Hydroxy 38.3 30.0 - 100.0 ng/mL  Lipid panel   Collection Time: 06/15/24  7:52 AM  Result Value Ref Range   Cholesterol, Total 183 100 - 199 mg/dL   Triglycerides 97 0 - 149 mg/dL   HDL 58 >60 mg/dL  VLDL Cholesterol Cal 18 5 - 40 mg/dL   LDL Chol Calc (NIH) 892 (H) 0 - 99 mg/dL   Chol/HDL Ratio 3.2 0.0 - 4.4 ratio  TSH   Collection Time: 06/15/24  7:52 AM  Result Value Ref Range   TSH 1.390 0.450 - 4.500 uIU/mL  Comprehensive metabolic panel with GFR   Collection Time: 06/15/24  7:52 AM  Result Value Ref Range   Glucose 68 (L) 70 - 99 mg/dL   BUN 15 8 - 27 mg/dL   Creatinine, Ser 9.29 0.57 - 1.00 mg/dL   eGFR 96 >40 fO/fpw/8.26   BUN/Creatinine Ratio 21 12 - 28   Sodium 139 134 - 144 mmol/L   Potassium 4.1 3.5 - 5.2 mmol/L   Chloride  103 96 - 106 mmol/L   CO2 19 (L) 20 - 29 mmol/L   Calcium 9.6 8.7 - 10.3 mg/dL   Total Protein 6.7 6.0 - 8.5 g/dL   Albumin 4.4 3.9 - 4.9 g/dL   Globulin, Total 2.3 1.5 - 4.5 g/dL   Bilirubin Total 0.8 0.0 - 1.2 mg/dL   Alkaline Phosphatase 87 44 - 121 IU/L   AST 19 0 - 40 IU/L   ALT 19 0 - 32 IU/L  CBC with Differential/Platelet   Collection Time: 06/15/24  7:52 AM  Result Value Ref Range   WBC 9.4 3.4 - 10.8 x10E3/uL   RBC 4.93 3.77 - 5.28 x10E6/uL   Hemoglobin 14.6 11.1 - 15.9 g/dL   Hematocrit 54.3 65.9 - 46.6 %   MCV 93 79 - 97 fL   MCH 29.6 26.6 - 33.0 pg   MCHC 32.0 31.5 - 35.7 g/dL   RDW 86.9 88.2 - 84.5 %   Platelets 309 150 - 450 x10E3/uL   Neutrophils 60 Not Estab. %   Lymphs 34 Not Estab. %   Monocytes 5 Not Estab. %   Eos 1 Not Estab. %   Basos 0 Not Estab. %   Neutrophils Absolute 5.5 1.4 - 7.0 x10E3/uL   Lymphocytes Absolute 3.2 (H) 0.7 - 3.1 x10E3/uL   Monocytes Absolute 0.5 0.1 - 0.9 x10E3/uL   EOS (ABSOLUTE) 0.1 0.0 - 0.4 x10E3/uL   Basophils Absolute 0.0 0.0 - 0.2 x10E3/uL   Immature Granulocytes 0 Not Estab. %   Immature Grans (Abs) 0.0 0.0 - 0.1 x10E3/uL     Assessment & Plan:   Assessment & Plan Acute URI Acute upper respiratory infection Possible viral etiology given exposure and circulating infections. Negative COVID, flu, and strep tests. Discussed risk of progression to bronchitis or pneumonia. - Prescribed prednisone  to reduce inflammation and open airways. - Consider albuterol inhaler if symptoms progress to dry cough. - Advised to report if cough becomes productive or symptoms worsen. - Sent prescriptions to [facility] on [address]. Orders:   POCT Influenza A/B   POC COVID-19   predniSONE  (DELTASONE ) 20 MG tablet; Take 3 tablets (60 mg total) by mouth daily with breakfast for 3 days, THEN 2 tablets (40 mg total) daily with breakfast for 3 days, THEN 1 tablet (20 mg total) daily with breakfast for 3 days.   albuterol (VENTOLIN  HFA) 108 (90 Base) MCG/ACT inhaler; Inhale 2 puffs into the lungs every 6 (six) hours as needed for wheezing or shortness of breath.     There is no height or weight on file to calculate BMI..   No orders of the defined types were placed in this encounter.   No orders of the defined types were  placed in this encounter.    Follow-up: No follow-ups on file.  An After Visit Summary was printed and given to the patient.  Nola Angles, [place] [facility] [phone]

## 2024-09-10 NOTE — Assessment & Plan Note (Signed)
 Acute upper respiratory infection Possible viral etiology given exposure and circulating infections. Negative COVID, flu, and strep tests. Discussed risk of progression to bronchitis or pneumonia. - Prescribed prednisone  to reduce inflammation and open airways. - Consider albuterol inhaler if symptoms progress to dry cough. - Advised to report if cough becomes productive or symptoms worsen. - Sent prescriptions to [facility] on [address]. Orders:   POCT Influenza A/B   POC COVID-19   predniSONE  (DELTASONE ) 20 MG tablet; Take 3 tablets (60 mg total) by mouth daily with breakfast for 3 days, THEN 2 tablets (40 mg total) daily with breakfast for 3 days, THEN 1 tablet (20 mg total) daily with breakfast for 3 days.   albuterol (VENTOLIN HFA) 108 (90 Base) MCG/ACT inhaler; Inhale 2 puffs into the lungs every 6 (six) hours as needed for wheezing or shortness of breath.

## 2024-09-14 ENCOUNTER — Encounter: Admitting: Physician Assistant

## 2024-09-21 ENCOUNTER — Encounter: Payer: Self-pay | Admitting: Family Medicine

## 2024-09-21 ENCOUNTER — Ambulatory Visit: Payer: Self-pay

## 2024-09-21 ENCOUNTER — Encounter: Payer: Self-pay | Admitting: Physician Assistant

## 2024-09-21 ENCOUNTER — Ambulatory Visit: Admitting: Family Medicine

## 2024-09-21 VITALS — BP 118/70 | HR 78 | Temp 97.9°F | Resp 16 | Ht 62.0 in | Wt 204.0 lb

## 2024-09-21 DIAGNOSIS — R5383 Other fatigue: Secondary | ICD-10-CM | POA: Insufficient documentation

## 2024-09-21 DIAGNOSIS — R0602 Shortness of breath: Secondary | ICD-10-CM | POA: Diagnosis not present

## 2024-09-21 DIAGNOSIS — R0789 Other chest pain: Secondary | ICD-10-CM | POA: Diagnosis not present

## 2024-09-21 DIAGNOSIS — I10 Essential (primary) hypertension: Secondary | ICD-10-CM

## 2024-09-21 DIAGNOSIS — R052 Subacute cough: Secondary | ICD-10-CM

## 2024-09-21 LAB — LAB REPORT - SCANNED: EGFR: 100

## 2024-09-21 MED ORDER — AMOXICILLIN-POT CLAVULANATE 875-125 MG PO TABS: 1.0000 | ORAL_TABLET | Freq: Two times a day (BID) | ORAL | 0 refills | Status: DC

## 2024-09-21 NOTE — Assessment & Plan Note (Addendum)
 Shortness of breath and atypical chest discomfort under evaluation for cardiac and pulmonary etiologies Intermittent symptoms with no chest pain or pressure. Differential includes cardiac and pulmonary causes. - Ordered EKG. - Referred to hospital for troponin, BNP, and D-dimer tests. - Instructed to wait at the hospital until contacted by provider. Orders:   POC Covid19/Flu A&B Antigen

## 2024-09-21 NOTE — Assessment & Plan Note (Addendum)
 Fatigue and muscle aches with evaluation for cardiac, thyroid, and other systemic causes Symptoms persisted before diltiazem initiation. Differential includes cardiac, thyroid, and systemic causes. - Ordered CBC, CMP, and TSH to be completed STAT at [facility] - Ordered EKG. - Referred to hospital for troponin, BNP, and D-dimer tests as well. - Instructed to wait at the hospital until contacted by provider. Orders:   POC Covid19/Flu A&B Antigen

## 2024-09-21 NOTE — Telephone Encounter (Signed)
 FYI Only or Action Required?: FYI only for provider: appointment scheduled on 11/4.  Patient was last seen in primary care on 09/10/2024 by Milon Cleaves, PA.  Called Nurse Triage reporting Fatigue.  Symptoms began several days ago.  Interventions attempted: Rest, hydration, or home remedies.  Symptoms are: gradually worsening.  Triage Disposition: See HCP Within 4 Hours (Or PCP Triage)  Patient/caregiver understands and will follow disposition?: Yes  Copied from CRM #[id number]. Topic: Clinical - Red Word Triage >> Sep 21, 2024 11:25 AM Emylou G wrote: [facility] that prompted transfer to Nurse Triage: achy and fatigued ( diltiazem (CARDIZEM CD) 180 MG 24 hr capsule - taking is new but isn't sure if that is it or just a virus ) started taking that 10/7 Reason for Disposition  [1] MODERATE weakness (e.g., interferes with work, school, normal activities) AND [2] cause unknown  (Exceptions: Weakness from acute minor illness or poor fluid intake; weakness is chronic and not worse.)  Answer Assessment - Initial Assessment Questions Generalized fatigue and exhaustion starting over the weekend. Unsure if she picked up something viral or if adding diltiazem 10/7 could be contributing.  150/79- BP cuff was brought into the office and RN in office said it was reading about 30 higher then her actual Systolic so around 120/79. Hr 70-80's. Denies dizziness, CP, SOB, abnormal bleeding. Endorses green phlegm when blowing nose but only in the morning, and mild cough overnight.  ED/UC/Callback precautions given and understood. Appt with PCP office this afternoon  1. DESCRIPTION: Describe how you are feeling.     Everything is so much harder to do. Would rather be at home laying down  2. SEVERITY: How bad is it?  Can you stand and walk?     moderate 3. ONSET: When did these symptoms begin? (e.g., hours, days, weeks, months)     Over the last weekend 4. CAUSE: What do you think is causing the  weakness or fatigue? (e.g., not drinking enough fluids, medical problem, trouble sleeping)     Unsure if new medication Diltiazem 5. NEW MEDICINES:  Have you started on any new medicines recently? (e.g., opioid pain medicines, benzodiazepines, muscle relaxants, antidepressants, antihistamines, neuroleptics, beta blockers)     Diltiazem started on 10/7 6. OTHER SYMPTOMS: Do you have any other symptoms? (e.g., chest pain, fever, cough, SOB, vomiting, diarrhea, bleeding, other areas of pain)     Cough overnight, green phlegm in the morning, increased appetite  Protocols used: Weakness (Generalized) and Fatigue-A-AH

## 2024-09-21 NOTE — Progress Notes (Unsigned)
 Acute Office Visit  Subjective:    Patient ID: Desiree Holmes, female    DOB: December 08, 1960, 63 y.o.   MRN: [id number]  Chief Complaint  Patient presents with  . Fatigue   Discussed the use of AI scribe software for clinical note transcription with the patient, who gave verbal consent to proceed.  History of Present Illness   Desiree Holmes is a 63 year old female with hypertension who presents with fatigue and muscle aches.  Fatigue and myalgias - Severe, debilitating fatigue making it challenging to get through the day - Muscle aches present - Fatigue and epistaxis occurred several weeks prior to starting diltiazem - Knees, ankles, and back feel 'thick' - No redness, heat, or swelling in knees, ankles, or back  Respiratory symptoms - Cough more pronounced at night - Irritant cough during the day - Sensation of 'feeling like I'm in a barrel' at times, without associated pressure or pain - No fever, sore throat, chest pain, or shortness of breath - Feels winded with normal activities  Peripheral edema - Mild swelling in legs  Hypertension and antihypertensive therapy - Currently taking diltiazem 180 mg, started after hospital visit for hypertensive emergency (blood pressure spike to 235/111) - Current blood pressure is 118/70, previously 128/78 on 2 week follow-up - Suspects fatigue and muscle aches may be related to diltiazem initiation on October 7th or 8th  Gastrointestinal symptoms - History of acid reflux, currently taking medication - No headache, nausea, dizziness, or indigestion currently      Past Medical History:  Diagnosis Date  . Allergy   . Barrett's esophagus   . GERD (gastroesophageal reflux disease)   . H. pylori infection   . History of colon polyps   . Hypertension     Past Surgical History:  Procedure Laterality Date  . ABDOMINAL HYSTERECTOMY     with BSO  . CESAREAN SECTION     x2  . DIAGNOSTIC LAPAROSCOPY    . DILATION  AND CURETTAGE OF UTERUS    . MINI LAP STERILIZATION    . TOTAL ABDOMINAL HYSTERECTOMY W/ BILATERAL SALPINGOOPHORECTOMY    . TUBOPLASTY / TUBOTUBAL ANASTOMOSIS      Family History  Problem Relation Age of Onset  . COPD Father   . Cancer Father        STOMACH  . Diabetes Mother   . Hypertension Mother   . Thyroid disease Mother   . Lupus Sister   . Diabetes Maternal Grandmother   . Heart disease Maternal Grandfather   . Heart disease Paternal Grandfather   . Cancer Paternal Grandfather        COLON  . Breast cancer Neg Hx     Social History   Socioeconomic History  . Marital status: Married    Spouse name: Not on file  . Number of children: 1  . Years of education: Not on file  . Highest education level: Some college, no degree  Occupational History  . 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  Tobacco Use  . Smoking status: Never  . Smokeless tobacco: Never  Vaping Use  . Vaping status: Never Used  Substance and Sexual Activity  . Alcohol use: No    Alcohol/week: 0.0 standard drinks of alcohol  . Drug use: No  . Sexual activity: Not Currently    Birth control/protection: Surgical    Comment: 1st intercourse 43 yo-5 partners  Other Topics Concern  . Not on file  Social History Narrative  . Not on file  Social Drivers of Health   Financial Resource Strain: Low Risk  (02/02/2024)   Overall Financial Resource Strain (CARDIA)   . Difficulty of Paying Living Expenses: Not hard at all  Food Insecurity: No Food Insecurity (02/02/2024)   Hunger Vital Sign   . Worried About Programme Researcher, Broadcasting/film/video in the Last Year: Never true   . Ran Out of Food in the Last Year: Never true  Transportation Needs: No Transportation Needs (02/02/2024)   PRAPARE - Transportation   . Lack of Transportation (Medical): No   . Lack of Transportation (Non-Medical): No  Physical Activity: Unknown (02/02/2024)   Exercise Vital Sign   . Days of Exercise per Week: 5 days   . Minutes of Exercise per Session:  Patient declined  Stress: No Stress Concern Present (02/02/2024)   [employer] of Occupational Health - Occupational Stress Questionnaire   . Feeling of Stress : Only a little  Social Connections: Moderately Integrated (02/02/2024)   Social Connection and Isolation Panel   . Frequency of Communication with Friends and Family: Three times a week   . Frequency of Social Gatherings with Friends and Family: Twice a week   . Attends Religious Services: More than 4 times per year   . Active Member of Clubs or Organizations: No   . Attends Banker Meetings: Patient declined   . Marital Status: Married  Catering Manager Violence: Not At Risk (06/10/2023)   Humiliation, Afraid, Rape, and Kick questionnaire   . Fear of Current or Ex-Partner: No   . Emotionally Abused: No   . Physically Abused: No   . Sexually Abused: No    Outpatient Medications Prior to Visit  Medication Sig Dispense Refill  . albuterol (VENTOLIN HFA) 108 (90 Base) MCG/ACT inhaler Inhale 2 puffs into the lungs every 6 (six) hours as needed for wheezing or shortness of breath. 8 g 2  . Calcium Carb-Cholecalciferol (SM CALCIUM 500/VITAMIN D3 PO) Take by mouth.    . cetirizine (ZYRTEC) 10 MG tablet Take 10 mg by mouth daily.    SABRA diltiazem (CARDIZEM CD) 180 MG 24 hr capsule Take 1 capsule (180 mg total) by mouth daily. 30 capsule 1  . estradiol  (ESTRACE ) 1 MG tablet TAKE 1 TABLET(1 MG) BY MOUTH DAILY 90 tablet 3  . losartan  (COZAAR ) 100 MG tablet TAKE 1 TABLET(100 MG) BY MOUTH DAILY 90 tablet 1  . Omega-3 Fatty Acids (FISH OIL) 1000 MG CAPS Take by mouth.    . omeprazole  (PRILOSEC) 40 MG capsule TAKE 1 CAPSULE BY MOUTH EVERY DAY 90 capsule 1  . vitamin C (ASCORBIC ACID) 500 MG tablet Take 500 mg by mouth daily.     No facility-administered medications prior to visit.    Allergies  Allergen Reactions  . Azelastine Itching and Anaphylaxis  . Iodinated Contrast Media Other (See Comments)  . Sulfa Antibiotics  Hives    Review of Systems  Constitutional:  Positive for fatigue. Negative for chills, diaphoresis and fever.  HENT:  Negative for congestion, ear pain and sinus pain.   Eyes: Negative.   Respiratory:  Positive for cough and chest tightness (feels like a barrel is around her). Negative for shortness of breath.   Cardiovascular:  Positive for leg swelling (left). Negative for chest pain.  Gastrointestinal:  Negative for abdominal pain, constipation, diarrhea, nausea and vomiting.  Endocrine: Negative.   Genitourinary:  Negative for dysuria.  Musculoskeletal:  Negative for arthralgias.  Skin: Negative.   Allergic/Immunologic: Negative.   Neurological:  Negative for dizziness, weakness, light-headedness and headaches.  Hematological: Negative.   Psychiatric/Behavioral:  Negative for dysphoric mood. The patient is not nervous/anxious.        Objective:        09/21/2024    3:21 PM 09/10/2024   12:14 PM 09/10/2024    8:46 AM  Vitals with BMI  Height 5' 2  5' 2  Weight 204 lbs  196 lbs  BMI 37.3  35.84  Systolic 118 128 889  Diastolic 70 78 60  Pulse 78  64    No data found.   Physical Exam Vitals reviewed.  Constitutional:      General: She is not in acute distress.    Appearance: Normal appearance. She is obese. She is ill-appearing.  Eyes:     Conjunctiva/sclera: Conjunctivae normal.  Cardiovascular:     Rate and Rhythm: Normal rate and regular rhythm.     Heart sounds: Normal heart sounds. No murmur heard. Pulmonary:     Effort: Pulmonary effort is normal.     Breath sounds: Normal breath sounds. No wheezing.  Abdominal:     General: Bowel sounds are normal.     Palpations: Abdomen is soft.     Tenderness: There is no abdominal tenderness.  Musculoskeletal:        General: Normal range of motion.     Right lower leg: No edema.     Left lower leg: Edema present.  Neurological:     Mental Status: She is alert and oriented to person, place, and time. Mental  status is at baseline.  Psychiatric:        Mood and Affect: Mood normal.        Behavior: Behavior normal.     Health Maintenance Due  Topic Date Due  . Medicare Annual Wellness (AWV)  Never done    There are no preventive care reminders to display for this patient.   Lab Results  Component Value Date   TSH 1.390 06/15/2024   Lab Results  Component Value Date   WBC 9.4 06/15/2024   HGB 14.6 06/15/2024   HCT 45.6 06/15/2024   MCV 93 06/15/2024   PLT 309 06/15/2024   Lab Results  Component Value Date   NA 139 06/15/2024   K 4.1 06/15/2024   CO2 19 (L) 06/15/2024   GLUCOSE 68 (L) 06/15/2024   BUN 15 06/15/2024   CREATININE 0.70 06/15/2024   BILITOT 0.8 06/15/2024   ALKPHOS 87 06/15/2024   AST 19 06/15/2024   ALT 19 06/15/2024   PROT 6.7 06/15/2024   ALBUMIN 4.4 06/15/2024   CALCIUM 9.6 06/15/2024   EGFR 96 06/15/2024   Lab Results  Component Value Date   CHOL 183 06/15/2024   Lab Results  Component Value Date   HDL 58 06/15/2024   Lab Results  Component Value Date   LDLCALC 107 (H) 06/15/2024   Lab Results  Component Value Date   TRIG 97 06/15/2024   Lab Results  Component Value Date   CHOLHDL 3.2 06/15/2024   No results found for: HGBA1C      Results for orders placed or performed in visit on 09/10/24  POCT Influenza A/B   Collection Time: 09/10/24  9:18 AM  Result Value Ref Range   Influenza A, POC Negative Negative   Influenza B, POC Negative Negative  POC COVID-19   Collection Time: 09/10/24  9:18 AM  Result Value Ref Range   SARS Coronavirus 2 Ag Negative Negative  Assessment & Plan:   Assessment & Plan Fatigue, unspecified type Fatigue and muscle aches with evaluation for cardiac, thyroid, and other systemic causes Symptoms persisted before diltiazem initiation. Differential includes cardiac, thyroid, and systemic causes. - Ordered CBC, CMP, TSH, and troponin. - Ordered EKG. - Referred to hospital for troponin, BNP, and  D-dimer tests. - Instructed to wait at the hospital until contacted by provider. Orders: .  POC Covid19/Flu A&B Antigen  Shortness of breath Shortness of breath and atypical chest discomfort under evaluation for cardiac and pulmonary etiologies Intermittent symptoms with no chest pain or pressure. Differential includes cardiac and pulmonary causes. - Ordered EKG. - Referred to hospital for troponin, BNP, and D-dimer tests. - Instructed to wait at the hospital until contacted by provider. Orders: .  POC Covid19/Flu A&B Antigen  Chest tightness Describes feeling as though a barrel is around her.  - EKG NSR- Ventricular rate 69, PR - 162, QRS - 84, QTc - 430, No ST  elevation or depression. Orders: .  EKG 12-Lead  Benign hypertension Hypertension, controlled on diltiazem Hypertension controlled with diltiazem 180 mg. Current BP 118/70 mmHg. BP Readings from Last 3 Encounters:  09/21/24 118/70  09/10/24 110/60  09/10/24 128/78   - Continue current dose of diltiazem and losartan .      Follow-up: Return in about 2 weeks (around 10/05/2024) for Keep appointment with Desiree Moats, PA.  An After Visit Summary was printed and given to the patient.  Harrie Cedar, FNP [facility] [phone]

## 2024-09-21 NOTE — Assessment & Plan Note (Addendum)
 Describes feeling as though a barrel is around her.  - EKG NSR- Ventricular rate 69, PR - 162, QRS - 84, QTc - 430, No ST  elevation or depression. Orders:   EKG 12-Lead

## 2024-09-21 NOTE — Assessment & Plan Note (Signed)
 Hypertension, controlled on diltiazem Hypertension controlled with diltiazem 180 mg. Current BP 118/70 mmHg. BP Readings from Last 3 Encounters:  09/21/24 118/70  09/10/24 110/60  09/10/24 128/78   - Continue current dose of diltiazem and losartan .

## 2024-09-22 ENCOUNTER — Ambulatory Visit (INDEPENDENT_AMBULATORY_CARE_PROVIDER_SITE_OTHER)
Admission: RE | Admit: 2024-09-22 | Discharge: 2024-09-22 | Disposition: A | Discharge status: Home / Self Care | Source: Ambulatory Visit | Attending: Physician Assistant | Admitting: Physician Assistant

## 2024-09-22 ENCOUNTER — Ambulatory Visit: Payer: Self-pay | Admitting: Physician Assistant

## 2024-09-22 DIAGNOSIS — R059 Cough, unspecified: Secondary | ICD-10-CM | POA: Diagnosis not present

## 2024-09-22 DIAGNOSIS — R052 Subacute cough: Secondary | ICD-10-CM | POA: Diagnosis not present

## 2024-09-22 LAB — POC COVID19/FLU A&B COMBO
Covid Antigen, POC: NEGATIVE
Influenza A Antigen, POC: NEGATIVE
Influenza B Antigen, POC: NEGATIVE

## 2024-09-23 ENCOUNTER — Ambulatory Visit: Payer: Self-pay | Admitting: Physician Assistant

## 2024-09-23 ENCOUNTER — Other Ambulatory Visit: Payer: Self-pay | Admitting: Physician Assistant

## 2024-09-23 ENCOUNTER — Encounter: Payer: Self-pay | Admitting: Family Medicine

## 2024-09-23 DIAGNOSIS — A048 Other specified bacterial intestinal infections: Secondary | ICD-10-CM | POA: Insufficient documentation

## 2024-09-23 DIAGNOSIS — T7840XA Allergy, unspecified, initial encounter: Secondary | ICD-10-CM | POA: Insufficient documentation

## 2024-09-23 DIAGNOSIS — R5383 Other fatigue: Secondary | ICD-10-CM

## 2024-09-23 DIAGNOSIS — R0789 Other chest pain: Secondary | ICD-10-CM

## 2024-09-23 DIAGNOSIS — R0602 Shortness of breath: Secondary | ICD-10-CM

## 2024-09-23 NOTE — Progress Notes (Addendum)
 " Cardiology Office Note:    Date:  09/24/2024   ID:  Desiree Holmes, DOB 1960-11-22, MRN [id number]  PCP:  Nicholaus Credit, PA-C  Cardiologist:  Redell Leiter, MD   Referring MD: Nicholaus Credit, PA-C  ASSESSMENT:    1. Chest pain of uncertain etiology   2. SOB (shortness of breath)   3. Primary hypertension    PLAN:    In order of problems listed above:  She had persistent chest pain for several days no specific anginal characteristics No findings suggest heart failure pulmonary embolism and no evidence of myocardial infarction Unfortunately she is at increased cardiovascular risk and further evaluation is warranted cardiac CTA will be set up it will give us  a coronary calcium score to judge whether she should be on lipid-lowering therapy and assess the presence of absence with severity of CAD and also to look at extracardiac structures in the chest.  Next appointment to be decided depending on the results of her CTA   Medication Adjustments/Labs and Tests Ordered: Current medicines are reviewed at length with the patient today.  Concerns regarding medicines are outlined above.  Orders Placed This Encounter  Procedures   EKG 12-Lead   No orders of the defined types were placed in this encounter.      History of Present Illness:    Desiree Holmes is a 63 y.o. female with a history of hypertension who is being seen today for the evaluation of recent symptoms chest pain and shortness of breath at the request of Nicholaus Credit, PA-C. She was seen with her PCP 09/21/2024 and referred to [facility] for labs including BMP D-dimer and lead EKG.  Labs from [facility] 09/21/2024 showed a normal CMP except for minimal elevation of ALT troponin was undetectable TSH was normal N-terminal proBNP was very low and the rule out heart failure range and D-dimer was normal 0.4 hemoglobin 15 platelet count normal 239,000 and white count was elevated 15,800.  She had an  office EKG that I cannot view in epic.  Chest x-ray 09/22/2024 no acute changes.  I had the honor of caring for her mother and father-in-law. 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 She recently had a viral syndrome was feeling very sick with it week and associated which she had a vague sensation as if her chest was encased not pleuritic not exertional not relieved with rest not severe and lasted for several days and resolved spontaneously. Not pleuritic, positionalor chest wall tenderness She has multiple cardiovascular risk including age and hypertension For further evaluation she will undergo cardiac CTA.   Past Medical History:  Diagnosis Date   Allergy    Barrett's esophagus    GERD (gastroesophageal reflux disease)    H. pylori infection    History of colon polyps    Hypertension     Past Surgical History:  Procedure Laterality Date   ABDOMINAL HYSTERECTOMY     with BSO   CESAREAN SECTION     x2   DIAGNOSTIC LAPAROSCOPY     DILATION AND CURETTAGE OF UTERUS     MINI LAP STERILIZATION     TOTAL ABDOMINAL HYSTERECTOMY W/ BILATERAL SALPINGOOPHORECTOMY     TUBOPLASTY / TUBOTUBAL ANASTOMOSIS      Current Medications: Current Meds  Medication Sig   albuterol  (VENTOLIN  HFA) 108 (90 Base) MCG/ACT inhaler Inhale 2 puffs into the lungs every  6 (six) hours as needed for wheezing or shortness of breath.   Biotin 1000 MCG CHEW Chew by mouth daily.   Calcium Carb-Cholecalciferol (SM CALCIUM 500/VITAMIN D3 PO) Take by mouth.   cetirizine (ZYRTEC) 10 MG tablet Take 10 mg by mouth daily.   diltiazem  (CARDIZEM  CD) 180 MG 24 hr capsule Take 1 capsule (180 mg total) by mouth daily.   estradiol  (ESTRACE ) 1 MG tablet TAKE 1 TABLET(1 MG) BY MOUTH DAILY   losartan  (COZAAR ) 100 MG tablet TAKE 1 TABLET(100 MG) BY MOUTH DAILY   Omega-3 Fatty Acids (FISH OIL) 1000 MG CAPS  Take by mouth.   omeprazole  (PRILOSEC) 40 MG capsule TAKE 1 CAPSULE BY MOUTH EVERY DAY   vitamin C (ASCORBIC ACID) 500 MG tablet Take 500 mg by mouth daily.     Allergies:   Azelastine, Iodinated contrast media, and Sulfa antibiotics   Social History   Socioeconomic History   Marital status: Married    Spouse name: Not on file   Number of children: 1   Years of education: Not on file   Highest education level: Some college, no degree  Occupational History   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  Tobacco Use   Smoking status: Never   Smokeless tobacco: Never  Vaping Use   Vaping status: Never Used  Substance and Sexual Activity   Alcohol use: No    Alcohol/week: 0.0 standard drinks of alcohol   Drug use: No   Sexual activity: Not Currently    Birth control/protection: Surgical    Comment: 1st intercourse 58 yo-5 partners  Other Topics Concern   Not on file  Social History Narrative   Not on file   Social Drivers of Health   Financial Resource Strain: Low Risk  (02/02/2024)   Overall Financial Resource Strain (CARDIA)    Difficulty of Paying Living Expenses: Not hard at all  Food Insecurity: No Food Insecurity (02/02/2024)   Hunger Vital Sign    Worried About Running Out of Food in the Last Year: Never true    Ran Out of Food in the Last Year: Never true  Transportation Needs: No Transportation Needs (02/02/2024)   PRAPARE - Administrator, Civil Service (Medical): No    Lack of Transportation (Non-Medical): No  Physical Activity: Unknown (02/02/2024)   Exercise Vital Sign    Days of Exercise per Week: 5 days    Minutes of Exercise per Session: Patient declined  Stress: No Stress Concern Present (02/02/2024)   [employer] of Occupational Health - Occupational Stress Questionnaire    Feeling of Stress : Only a little  Social Connections: Moderately Integrated (02/02/2024)   Social Connection and Isolation Panel    Frequency of Communication with Friends and Family:  Three times a week    Frequency of Social Gatherings with Friends and Family: Twice a week    Attends Religious Services: More than 4 times per year    Active Member of [employer] or Organizations: No    Attends Engineer, Structural: Patient declined    Marital Status: Married     Family History: The patient's family history includes COPD in her father; Cancer in her father and paternal grandfather; Diabetes in her maternal grandmother and mother; Heart disease in her maternal grandfather and paternal grandfather; Hypertension in her mother; Lupus in her sister; Thyroid  disease in her mother. There is no history of Breast cancer.  ROS:   ROS Please see the history of present illness.  All other systems reviewed and are negative.  EKGs/Labs/Other Studies Reviewed:    The following studies were reviewed today:  EKG Interpretation Date/Time:  Friday September 24 2024 13:54:08 EST Ventricular Rate:  82 PR Interval:  138 QRS Duration:  74 QT Interval:  352 QTC Calculation: 411 R Axis:   7  Text Interpretation: Normal sinus rhythm Normal ECG No previous ECGs available Confirmed by Monetta Rogue ([id number]) on 09/24/2024 2:05:19 PM    Recent Labs: 06/15/2024: ALT 19; BUN 15; Creatinine, Ser 0.70; Hemoglobin 14.6; Platelets 309; Potassium 4.1; Sodium 139; TSH 1.390  Recent Lipid Panel    Component Value Date/Time   CHOL 183 06/15/2024 0752   TRIG 97 06/15/2024 0752   HDL 58 06/15/2024 0752   CHOLHDL 3.2 06/15/2024 0752   LDLCALC 107 (H) 06/15/2024 0752    Physical Exam:    VS:  BP (!) 140/84   Pulse 82   Ht 5' 2 (1.575 m)   Wt 203 lb 3.2 oz (92.2 kg)   SpO2 92%   BMI 37.17 kg/m     Wt Readings from Last 3 Encounters:  09/24/24 203 lb 3.2 oz (92.2 kg)  09/21/24 204 lb (92.5 kg)  09/10/24 196 lb (88.9 kg)     GEN:  Well nourished, well developed in no acute distress HEENT: Normal NECK: No JVD; No carotid bruits LYMPHATICS: No lymphadenopathy CARDIAC: RRR, no  murmurs, rubs, gallops RESPIRATORY:  Clear to auscultation without rales, wheezing or rhonchi  ABDOMEN: Soft, non-tender, non-distended MUSCULOSKELETAL:  No edema; No deformity  SKIN: Warm and dry NEUROLOGIC:  Alert and oriented x 3 PSYCHIATRIC:  Normal affect     Signed, Rogue Monetta, MD  09/24/2024 2:20 PM    [facility] "

## 2024-09-24 ENCOUNTER — Ambulatory Visit: Attending: Cardiology | Admitting: Cardiology

## 2024-09-24 ENCOUNTER — Encounter: Payer: Self-pay | Admitting: Cardiology

## 2024-09-24 VITALS — BP 140/84 | HR 82 | Ht 62.0 in | Wt 203.2 lb

## 2024-09-24 DIAGNOSIS — I10 Essential (primary) hypertension: Secondary | ICD-10-CM

## 2024-09-24 DIAGNOSIS — R072 Precordial pain: Secondary | ICD-10-CM

## 2024-09-24 DIAGNOSIS — R079 Chest pain, unspecified: Secondary | ICD-10-CM | POA: Diagnosis not present

## 2024-09-24 DIAGNOSIS — R0602 Shortness of breath: Secondary | ICD-10-CM | POA: Diagnosis not present

## 2024-09-24 MED ORDER — METOPROLOL TARTRATE 100 MG PO TABS: 100.0000 mg | ORAL_TABLET | Freq: Once | ORAL | 0 refills | Status: DC

## 2024-09-24 MED ORDER — PREDNISONE 50 MG PO TABS: ORAL_TABLET | ORAL | 0 refills | Status: DC

## 2024-09-24 NOTE — Patient Instructions (Signed)
 Medication Instructions:  Your physician recommends that you continue on your current medications as directed. Please refer to the Current Medication list given to you today.  *If you need a refill on your cardiac medications before your next appointment, please call your pharmacy*  Lab Work: Your physician recommends that you return for lab work in:   Labs today: BMP  If you have labs (blood work) drawn today and your tests are completely normal, you will receive your results only by: MyChart Message (if you have MyChart) OR A paper copy in the mail If you have any lab test that is abnormal or we need to change your treatment, we will call you to review the results.  Testing/Procedures:   Your cardiac CT will be scheduled at one of the below locations:   [facility] [address] [place], [place] [address] [phone] (Severe contrast allergies only)  OR   [facility] [address] [place], [place] [address] [phone]  OR   [facility] [address] [place], [place] [address] [phone]  OR   [facility] and Vascular Tower [address]  [place], [place] [address]  OR   [facility] [address] [place], [place] [id number]  If scheduled at [facility], please arrive at the [facility] and Children's Entrance (Entrance C2) of [facility] 30 minutes prior to test start time. You can use the FREE valet parking offered at entrance C (encouraged to control the heart rate for the test)  Proceed to the [facility] Radiology Department (first floor) to check-in and test prep.  All radiology patients and guests should use entrance C2 at [facility], accessed from [facility], even though the hospital's physical address listed is [address]  If scheduled at the Heart and Vascular Tower at [address], please enter the parking lot using the  [address] entrance and use the FREE valet service at the patient drop-off area. Enter the building and check-in with registration on the main floor.  If scheduled at [facility], please arrive to the Heart and Vascular Center 15 mins early for check-in and test prep.  There is spacious parking and easy access to the radiology department from the [facility] Heart and Vascular entrance. Please enter here and check-in with the desk attendant.   If scheduled at [facility], please arrive 30 minutes early for check-in and test prep.  Please follow these instructions carefully (unless otherwise directed):  On the Night Before the Test: Be sure to Drink plenty of water. Do not consume any caffeinated/decaffeinated beverages or chocolate 12 hours prior to your test. Do not take any antihistamines 12 hours prior to your test.  If the patient has contrast allergy: Patient will need a prescription for Prednisone  and very clear instructions (as follows): Prednisone  50 mg - take 13 hours prior to test Take another Prednisone  50 mg 7 hours prior to test Take another Prednisone  50 mg 1 hour prior to test Take Benadryl 50 mg 1 hour prior to test Patient must complete all four doses of above prophylactic medications. Patient will need a ride after test due to Benadryl.  On the Day of the Test: Drink plenty of water until 1 hour prior to the test. Do not eat any food 1 hour prior to test. You may take your regular medications prior to the test.  Take metoprolol (Lopressor) two hours  prior to test. Patients who wear a continuous glucose monitor MUST remove the device prior to scanning. FEMALES- please wear underwire-free bra if available, avoid dresses & tight clothing      After the Test: Drink plenty of water. After receiving IV contrast, you may experience a mild flushed feeling. This is normal. On occasion, you may experience a mild rash up to 24 hours after the  test. This is not dangerous. If this occurs, you can take Benadryl 25 mg, Zyrtec, Claritin, or Allegra and increase your fluid intake. (Patients taking Tikosyn should avoid Benadryl, and may take Zyrtec, Claritin, or Allegra) If you experience trouble breathing, this can be serious. If it is severe call 911 IMMEDIATELY. If it is mild, please call our office.  We will call to schedule your test 2-4 weeks out understanding that some insurance companies will need an authorization prior to the service being performed.   For more information and frequently asked questions, please visit our website : [email or web address]  For non-scheduling related questions, please contact the cardiac imaging nurse navigator should you have any questions/concerns: Cardiac Imaging Nurse Navigators Direct Office Dial: [phone]   For scheduling needs, including cancellations and rescheduling, please call Brittany, [phone].   Follow-Up: At [facility], you and your health needs are our priority.  As part of our continuing mission to provide you with exceptional heart care, our providers are all part of one team.  This team includes your primary Cardiologist (physician) and Advanced Practice Providers or APPs (Physician Assistants and Nurse Practitioners) who all work together to provide you with the care you need, when you need it.  Your next appointment:   Follow up as needed   Provider:   Redell Leiter, MD    We recommend signing up for the patient portal called MyChart.  Sign up information is provided on this After Visit Summary.  MyChart is used to connect with patients for Virtual Visits (Telemedicine).  Patients are able to view lab/test results, encounter notes, upcoming appointments, etc.  Non-urgent messages can be sent to your provider as well.   To learn more about what you can do with MyChart, go to [email or web address].   Other Instructions None

## 2024-09-25 LAB — BASIC METABOLIC PANEL WITH GFR
BUN/Creatinine Ratio: 25 (ref 12–28)
BUN: 16 mg/dL (ref 8–27)
CO2: 21 mmol/L (ref 20–29)
Calcium: 9.8 mg/dL (ref 8.7–10.3)
Chloride: 103 mmol/L (ref 96–106)
Creatinine, Ser: 0.64 mg/dL (ref 0.57–1.00)
Glucose: 68 mg/dL — ABNORMAL LOW (ref 70–99)
Potassium: 4.3 mmol/L (ref 3.5–5.2)
Sodium: 139 mmol/L (ref 134–144)
eGFR: 98 mL/min/1.73 (ref >59)

## 2024-09-26 ENCOUNTER — Ambulatory Visit: Payer: Self-pay | Admitting: Cardiology

## 2024-09-26 ENCOUNTER — Other Ambulatory Visit: Payer: Self-pay | Admitting: Family Medicine

## 2024-09-26 ENCOUNTER — Telehealth: Payer: Self-pay | Admitting: Family Medicine

## 2024-09-26 DIAGNOSIS — R051 Acute cough: Secondary | ICD-10-CM

## 2024-09-26 MED ORDER — GUAIFENESIN-CODEINE 100-10 MG/5ML PO SOLN: 5.0000 mL | Freq: Three times a day (TID) | ORAL | 0 refills | Status: DC | PRN

## 2024-09-26 NOTE — Telephone Encounter (Signed)
 Patient call this morning with complaints of consistent cough and insomnia. She reports she went to her cardiologist and they prescribed her Augmentin  for a chest cold and now she is feeling more congested, her voice has changed and having a productive cough that is keeping her up at night. Advised her to increase fluids and try Flonase for ear fullness and sent a cough medication to the pharmacy for her to pick up.

## 2024-09-27 DIAGNOSIS — R051 Acute cough: Secondary | ICD-10-CM | POA: Diagnosis not present

## 2024-09-27 DIAGNOSIS — D72829 Elevated white blood cell count, unspecified: Secondary | ICD-10-CM | POA: Diagnosis not present

## 2024-09-27 DIAGNOSIS — R0981 Nasal congestion: Secondary | ICD-10-CM | POA: Diagnosis not present

## 2024-10-05 ENCOUNTER — Encounter: Payer: Self-pay | Admitting: Physician Assistant

## 2024-10-05 ENCOUNTER — Ambulatory Visit (INDEPENDENT_AMBULATORY_CARE_PROVIDER_SITE_OTHER): Admitting: Physician Assistant

## 2024-10-05 VITALS — BP 118/88 | HR 73 | Temp 98.0°F | Ht 62.0 in | Wt 204.0 lb

## 2024-10-05 DIAGNOSIS — Z0000 Encounter for general adult medical examination without abnormal findings: Secondary | ICD-10-CM | POA: Diagnosis not present

## 2024-10-05 DIAGNOSIS — E782 Mixed hyperlipidemia: Secondary | ICD-10-CM | POA: Diagnosis not present

## 2024-10-05 DIAGNOSIS — I10 Essential (primary) hypertension: Secondary | ICD-10-CM

## 2024-10-05 DIAGNOSIS — J060 Acute laryngopharyngitis: Secondary | ICD-10-CM

## 2024-10-05 DIAGNOSIS — E559 Vitamin D deficiency, unspecified: Secondary | ICD-10-CM | POA: Diagnosis not present

## 2024-10-05 MED ORDER — FLUCONAZOLE 150 MG PO TABS: 150.0000 mg | ORAL_TABLET | Freq: Once | ORAL | 0 refills | Status: AC

## 2024-10-05 MED ORDER — AZITHROMYCIN 250 MG PO TABS: ORAL_TABLET | ORAL | 0 refills | Status: AC

## 2024-10-05 MED ORDER — BENZONATATE 100 MG PO CAPS: 100.0000 mg | ORAL_CAPSULE | Freq: Two times a day (BID) | ORAL | 0 refills | Status: DC | PRN

## 2024-10-05 NOTE — Progress Notes (Signed)
 Chief Complaint  Patient presents with   Medical Management of Chronic Issues     Subjective:   Desiree Holmes is a 63 y.o. female who presents for a Welcome to Medicare Exam.   Allergies (verified) Azelastine, Iodinated contrast media, and Sulfa antibiotics   History: Past Medical History:  Diagnosis Date   Allergy    Barrett's esophagus    GERD (gastroesophageal reflux disease)    H. pylori infection    History of colon polyps    Hypertension    Past Surgical History:  Procedure Laterality Date   ABDOMINAL HYSTERECTOMY     with BSO   CESAREAN SECTION     x2   DIAGNOSTIC LAPAROSCOPY     DILATION AND CURETTAGE OF UTERUS     MINI LAP STERILIZATION     TOTAL ABDOMINAL HYSTERECTOMY W/ BILATERAL SALPINGOOPHORECTOMY     TUBOPLASTY / TUBOTUBAL ANASTOMOSIS     Family History  Problem Relation Age of Onset   COPD Father    Cancer Father        STOMACH   Diabetes Mother    Hypertension Mother    Thyroid disease Mother    Lupus Sister    Diabetes Maternal Grandmother    Heart disease Maternal Grandfather    Heart disease Paternal Grandfather    Cancer Paternal Grandfather        COLON   Breast cancer Neg Hx    Social History   Occupational History   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  Tobacco Use   Smoking status: Never   Smokeless tobacco: Never  Vaping Use   Vaping status: Never Used  Substance and Sexual Activity   Alcohol use: No    Alcohol/week: 0.0 standard drinks of alcohol   Drug use: No   Sexual activity: Not Currently    Birth control/protection: Surgical    Comment: 1st intercourse 16 yo-5 partners   Tobacco Counseling Counseling given: Not Answered  SDOH Screenings   Food Insecurity: No Food Insecurity (10/05/2024)  Housing: Low Risk  (10/05/2024)  Transportation Needs: No Transportation Needs (10/05/2024)  Utilities: Not At Risk (10/05/2024)  Alcohol Screen: Low Risk  (06/10/2023)  Depression (PHQ2-9): Low Risk  (10/05/2024)   Financial Resource Strain: Low Risk  (02/02/2024)  Physical Activity: Unknown (10/05/2024)  Social Connections: Moderately Integrated (10/05/2024)  Stress: No Stress Concern Present (10/05/2024)  Tobacco Use: Low Risk  (10/05/2024)  Health Literacy: Adequate Health Literacy (06/10/2023)   See flowsheets for full screening details  Depression Screen PHQ 2 & 9 Depression Scale- Over the past 2 weeks, how often have you been bothered by any of the following problems? Little interest or pleasure in doing things: 0 Feeling down, depressed, or hopeless (PHQ Adolescent also includes...irritable): 0 PHQ-2 Total Score: 0 Trouble falling or staying asleep, or sleeping too much: 1 Feeling tired or having little energy: 1 Poor appetite or overeating (PHQ Adolescent also includes...weight loss): 1 Feeling bad about yourself - or that you are a failure or have let yourself or your family down: 1 Trouble concentrating on things, such as reading the newspaper or watching television (PHQ Adolescent also includes...like school work): 0 Moving or speaking so slowly that other people could have noticed. Or the opposite - being so fidgety or restless that you have been moving around a lot more than usual: 0 Thoughts that you would be better off dead, or of hurting yourself in some way: 0 PHQ-9 Total Score: 5 If you checked off any problems, how  difficult have these problems made it for you to do your work, take care of things at home, or get along with other people?: Not difficult at all      Goals Addressed   None    Visit info / Clinical Intake: Medicare Wellness Visit Type:: Welcome to [employer] (IPPE) Persons participating in visit:: patient Medicare Wellness Visit Mode:: In-person (required for WTM) Information given by:: patient Interpreter Needed?: No Pre-visit prep was completed: no AWV questionnaire completed by patient prior to visit?: no Living arrangements:: lives with spouse/significant  other Patient's Overall Health Status Rating: good Typical amount of pain: none Does pain affect daily life?: no Are you currently prescribed opioids?: no  Dietary Habits and Nutritional Risks How many meals a day?: 3 Eats fruit and vegetables daily?: yes Most meals are obtained by: preparing own meals In the last 2 weeks, have you had any of the following?: none  Functional Status Activities of Daily Living (to include ambulation/medication): (Patient-Rptd) Independent Ambulation: (Patient-Rptd) Independent Medication Administration: Independent Home Management: (Patient-Rptd) Independent Manage your own finances?: yes Primary transportation is: driving Concerns about vision?: no *vision screening is required for WTM* Concerns about hearing?: no  Fall Screening Falls in the past year?: (Patient-Rptd) 0 Number of falls in past year: (Patient-Rptd) 0 Was there an injury with Fall?: (Patient-Rptd) 0 Fall Risk Category Calculator: (Patient-Rptd) 0 Patient Fall Risk Level: (Patient-Rptd) Low Fall Risk  Fall Risk Patient at Risk for Falls Due to: No Fall Risks Fall risk Follow up: Falls evaluation completed  Home and Transportation Safety: All rugs have non-skid backing?: (!) no All stairs or steps have railings?: (!) no Grab bars in the bathtub or shower?: yes Have non-skid surface in bathtub or shower?: yes Good home lighting?: yes Regular seat belt use?: yes Hospital stays in the last year:: no  Cognitive Assessment Difficulty concentrating, remembering, or making decisions? : no Will 6CIT or Mini Cog be Completed: yes What year is it?: 0 points What month is it?: 0 points About what time is it?: 0 points Count backwards from 20 to 1: 0 points Say the months of the year in reverse: 0 points  Advance Directives (For Healthcare) Does Patient Have a Medical Advance Directive?: Yes Does patient want to make changes to medical advance directive?: No - Patient  declined Type of Advance Directive: Living will   CONSTITUTIONAL: Negative for chills, fatigue, fever,  E/N/T: pt has had cough and congestion for the past week - cough at times productive CARDIOVASCULAR: Negative for chest pain, dizziness, palpitations and pedal edema.  RESPIRATORY: has had recent cough GASTROINTESTINAL: Negative for abdominal pain, acid reflux symptoms, constipation, diarrhea, nausea and vomiting.  MSK: Negative for arthralgias and myalgias.  INTEGUMENTARY: Negative for rash.  NEUROLOGICAL: Negative for dizziness and headaches.  PSYCHIATRIC: Negative for sleep disturbance and to question depression screen.  Negative for depression, negative for anhedonia.           Objective:  PHYSICAL EXAM:   VS: BP 118/88   Pulse 73   Temp 98 F (36.7 C) (Temporal)   Ht 5' 2 (1.575 m)   Wt 204 lb (92.5 kg)   SpO2 98%   BMI 37.31 kg/m   GEN: Well nourished, well developed, in no acute distress  HEENT: normal external ears and nose - normal external auditory canals and TMS -  - Lips, Teeth and Gums - normal  Oropharynx - erythema/pnd Cardiac: RRR; no murmurs, rubs, or gallops,no edema - Respiratory:  normal respiratory  rate and pattern with no distress - normal breath sounds with no rales, rhonchi, wheezes or rubs Skin: warm and dry, no rash  Neuro:  Alert and Oriented x 3, - CN II-Xii grossly intact Psych: euthymic mood, appropriate affect and demeanor   Current Medications (verified) Outpatient Encounter Medications as of 10/05/2024  Medication Sig   albuterol (VENTOLIN HFA) 108 (90 Base) MCG/ACT inhaler Inhale 2 puffs into the lungs every 6 (six) hours as needed for wheezing or shortness of breath.   azithromycin  (ZITHROMAX ) 250 MG tablet Take 2 tablets on day 1, then 1 tablet daily on days 2 through 5   benzonatate (TESSALON) 100 MG capsule Take 1 capsule (100 mg total) by mouth 2 (two) times daily as needed for cough.   Biotin 1000 MCG CHEW Chew by mouth daily.    Calcium Carb-Cholecalciferol (SM CALCIUM 500/VITAMIN D3 PO) Take by mouth.   cetirizine (ZYRTEC) 10 MG tablet Take 10 mg by mouth daily.   diltiazem (CARDIZEM CD) 180 MG 24 hr capsule Take 1 capsule (180 mg total) by mouth daily.   estradiol  (ESTRACE ) 1 MG tablet TAKE 1 TABLET(1 MG) BY MOUTH DAILY   fluconazole  (DIFLUCAN ) 150 MG tablet Take 1 tablet (150 mg total) by mouth once for 1 dose.   guaiFENesin-codeine 100-10 MG/5ML syrup Take 5 mLs by mouth 3 (three) times daily as needed for cough.   losartan  (COZAAR ) 100 MG tablet TAKE 1 TABLET(100 MG) BY MOUTH DAILY   Omega-3 Fatty Acids (FISH OIL) 1000 MG CAPS Take by mouth.   omeprazole  (PRILOSEC) 40 MG capsule TAKE 1 CAPSULE BY MOUTH EVERY DAY   vitamin C (ASCORBIC ACID) 500 MG tablet Take 500 mg by mouth daily.   [DISCONTINUED] metoprolol tartrate (LOPRESSOR) 100 MG tablet Take 1 tablet (100 mg total) by mouth once for 1 dose. Please take this medication 2 hours before CT (Patient not taking: Reported on 10/05/2024)   [DISCONTINUED] predniSONE  (DELTASONE ) 50 MG tablet 1. Prednisone  50 mg - take 13 hours prior to test 2. Take another Prednisone  50 mg 7 hours prior to test 3. Take another Prednisone  50 mg 1 hour prior to test (Patient not taking: Reported on 10/05/2024)   No facility-administered encounter medications on file as of 10/05/2024.   Hearing/Vision screen Vision Screening   Right eye Left eye Both eyes  Without correction 20/32 20/32 20/25   With correction     Hearing Screening - Comments:: Whisper test from 55ft. Left- WNL Right- WNL Immunizations and Health Maintenance Health Maintenance  Topic Date Due   Influenza Vaccine  02/15/2025 (Originally 06/18/2024)   Pneumococcal Vaccine: 50+ Years (1 of 1 - PCV) 06/14/2025 (Originally 05/28/2009)   Mammogram  01/26/2025   Medicare Annual Wellness (AWV)  10/05/2025   Colonoscopy  12/15/2030   DEXA SCAN  Completed   Hepatitis C Screening  Completed   Zoster Vaccines- Shingrix   Completed   Hepatitis B Vaccines 19-59 Average Risk  Aged Out   Meningococcal B Vaccine  Aged Out   DTaP/Tdap/Td  Discontinued   COVID-19 Vaccine  Discontinued   HIV Screening  Discontinued    EKG: normal EKG, normal sinus rhythm, unchanged from previous tracings     Assessment/Plan:  This is a routine wellness examination for Shenaya.  Patient Care Team: Nicholaus Credit, DEVONNA as PCP - General (Physician Assistant) Nicholaus Credit, PA-C as Physician Assistant (Physician Assistant) Prentiss Annabella LABOR, NP as Nurse Practitioner (Gynecology)  I have personally reviewed and noted the following in the patient's  chart:   Medical and social history Use of alcohol, tobacco or illicit drugs  Current medications and supplements including opioid prescriptions. Functional ability and status Nutritional status Physical activity Advanced directives List of other physicians Hospitalizations, surgeries, and ER visits in previous 12 months Vitals Screenings to include cognitive, depression, and falls Referrals and appointments  Orders Placed This Encounter  Procedures   CBC with Differential/Platelet   Comprehensive metabolic panel with GFR   Lipid panel   TSH   EKG 12-Lead   In addition, I have reviewed and discussed with patient certain preventive protocols, quality metrics, and best practice recommendations. A written personalized care plan for preventive services as well as general preventive health recommendations were provided to patient.     Return in 3 months (on 01/05/2025) for chronic fasting follow-up.

## 2024-10-06 ENCOUNTER — Ambulatory Visit: Payer: Self-pay | Admitting: Physician Assistant

## 2024-10-06 ENCOUNTER — Other Ambulatory Visit: Payer: Self-pay | Admitting: Physician Assistant

## 2024-10-06 DIAGNOSIS — R899 Unspecified abnormal finding in specimens from other organs, systems and tissues: Secondary | ICD-10-CM

## 2024-10-06 LAB — COMPREHENSIVE METABOLIC PANEL WITH GFR
ALT: 15 IU/L (ref 0–32)
AST: 17 IU/L (ref 0–40)
Albumin: 4 g/dL (ref 3.9–4.9)
Alkaline Phosphatase: 100 IU/L (ref 49–135)
BUN/Creatinine Ratio: 19 (ref 12–28)
BUN: 13 mg/dL (ref 8–27)
Bilirubin Total: 0.5 mg/dL (ref 0.0–1.2)
CO2: 19 mmol/L — ABNORMAL LOW (ref 20–29)
Calcium: 9.9 mg/dL (ref 8.7–10.3)
Chloride: 101 mmol/L (ref 96–106)
Creatinine, Ser: 0.68 mg/dL (ref 0.57–1.00)
Globulin, Total: 3 g/dL (ref 1.5–4.5)
Glucose: 85 mg/dL (ref 70–99)
Potassium: 4.2 mmol/L (ref 3.5–5.2)
Sodium: 139 mmol/L (ref 134–144)
Total Protein: 7 g/dL (ref 6.0–8.5)
eGFR: 97 mL/min/1.73 (ref >59)

## 2024-10-06 LAB — CBC WITH DIFFERENTIAL/PLATELET
Basophils Absolute: 0 x10E3/uL (ref 0.0–0.2)
Basos: 0 %
EOS (ABSOLUTE): 0.1 x10E3/uL (ref 0.0–0.4)
Eos: 1 %
Hematocrit: 46.8 % — ABNORMAL HIGH (ref 34.0–46.6)
Hemoglobin: 15.5 g/dL (ref 11.1–15.9)
Immature Grans (Abs): 0.1 x10E3/uL (ref 0.0–0.1)
Immature Granulocytes: 1 %
Lymphocytes Absolute: 3 x10E3/uL (ref 0.7–3.1)
Lymphs: 26 %
MCH: 29.1 pg (ref 26.6–33.0)
MCHC: 33.1 g/dL (ref 31.5–35.7)
MCV: 88 fL (ref 79–97)
Monocytes Absolute: 0.7 x10E3/uL (ref 0.1–0.9)
Monocytes: 7 %
Neutrophils Absolute: 7.4 x10E3/uL — ABNORMAL HIGH (ref 1.4–7.0)
Neutrophils: 65 %
Platelets: 480 x10E3/uL — ABNORMAL HIGH (ref 150–450)
RBC: 5.32 x10E6/uL — ABNORMAL HIGH (ref 3.77–5.28)
RDW: 12.9 % (ref 11.7–15.4)
WBC: 11.3 x10E3/uL — ABNORMAL HIGH (ref 3.4–10.8)

## 2024-10-06 LAB — LIPID PANEL
Chol/HDL Ratio: 3.4 ratio (ref 0.0–4.4)
Cholesterol, Total: 186 mg/dL (ref 100–199)
HDL: 55 mg/dL (ref >39)
LDL Chol Calc (NIH): 108 mg/dL — ABNORMAL HIGH (ref 0–99)
Triglycerides: 129 mg/dL (ref 0–149)
VLDL Cholesterol Cal: 23 mg/dL (ref 5–40)

## 2024-10-06 LAB — TSH: TSH: 1.55 u[IU]/mL (ref 0.450–4.500)

## 2024-10-19 ENCOUNTER — Ambulatory Visit: Admitting: Physician Assistant

## 2024-10-19 ENCOUNTER — Other Ambulatory Visit: Payer: Self-pay | Admitting: Physician Assistant

## 2024-10-19 ENCOUNTER — Telehealth: Payer: Self-pay | Admitting: Physician Assistant

## 2024-10-19 ENCOUNTER — Encounter: Payer: Self-pay | Admitting: Physician Assistant

## 2024-10-19 VITALS — BP 158/94 | HR 74 | Temp 97.8°F | Ht 62.0 in | Wt 211.0 lb

## 2024-10-19 DIAGNOSIS — I10 Essential (primary) hypertension: Secondary | ICD-10-CM

## 2024-10-19 DIAGNOSIS — J40 Bronchitis, not specified as acute or chronic: Secondary | ICD-10-CM

## 2024-10-19 MED ORDER — PREDNISONE 20 MG PO TABS: ORAL_TABLET | ORAL | 0 refills | Status: DC

## 2024-10-19 MED ORDER — AMOXICILLIN-POT CLAVULANATE 875-125 MG PO TABS: 1.0000 | ORAL_TABLET | Freq: Two times a day (BID) | ORAL | 0 refills | Status: DC

## 2024-10-19 MED ORDER — DILTIAZEM HCL ER COATED BEADS 240 MG PO CP24: 240.0000 mg | ORAL_CAPSULE | Freq: Every day | ORAL | 2 refills | Status: AC

## 2024-10-19 MED ORDER — FLUCONAZOLE 150 MG PO TABS: 150.0000 mg | ORAL_TABLET | Freq: Once | ORAL | 0 refills | Status: AC

## 2024-10-19 NOTE — Telephone Encounter (Signed)
 Rx sent.

## 2024-10-19 NOTE — Telephone Encounter (Signed)
 Patient was seen in office today, was prescribed anti-biotics, Patient is requesting Diflucan  be prescribed as well.

## 2024-10-19 NOTE — Progress Notes (Signed)
 Acute Office Visit  Subjective:    Patient ID: Desiree Holmes, female    DOB: 1961-07-24, 63 y.o.   MRN: [id number]  Chief Complaint  Patient presents with   Still coughing    HPI: Patient is in today for recheck of cough/congestion.  She was treated with zpack about 2 weeks ago and still has cough - mostly nonproductive  Denies fever or malaise  Pt bp elevated today - denies chest pain or sob - no edema - currently taking losartan  100mg  and cardizem  180mg    Current Outpatient Medications:    albuterol  (VENTOLIN  HFA) 108 (90 Base) MCG/ACT inhaler, Inhale 2 puffs into the lungs every 6 (six) hours as needed for wheezing or shortness of breath., Disp: 8 g, Rfl: 2   amoxicillin -clavulanate (AUGMENTIN ) 875-125 MG tablet, Take 1 tablet by mouth 2 (two) times daily., Disp: 20 tablet, Rfl: 0   benzonatate  (TESSALON ) 100 MG capsule, Take 1 capsule (100 mg total) by mouth 2 (two) times daily as needed for cough., Disp: 20 capsule, Rfl: 0   Biotin 1000 MCG CHEW, Chew by mouth daily., Disp: , Rfl:    Calcium Carb-Cholecalciferol (SM CALCIUM 500/VITAMIN D3 PO), Take by mouth., Disp: , Rfl:    cetirizine (ZYRTEC) 10 MG tablet, Take 10 mg by mouth daily., Disp: , Rfl:    diltiazem  (CARDIZEM  CD) 240 MG 24 hr capsule, Take 1 capsule (240 mg total) by mouth daily., Disp: 30 capsule, Rfl: 2   estradiol  (ESTRACE ) 1 MG tablet, TAKE 1 TABLET(1 MG) BY MOUTH DAILY, Disp: 90 tablet, Rfl: 3   losartan  (COZAAR ) 100 MG tablet, TAKE 1 TABLET(100 MG) BY MOUTH DAILY, Disp: 90 tablet, Rfl: 1   Omega-3 Fatty Acids (FISH OIL) 1000 MG CAPS, Take by mouth., Disp: , Rfl:    omeprazole  (PRILOSEC) 40 MG capsule, TAKE 1 CAPSULE BY MOUTH EVERY DAY, Disp: 90 capsule, Rfl: 1   predniSONE  (DELTASONE ) 20 MG tablet, 1 po tid for 3 days then 1 po bid for 3 days then 1 po qd for 3 days, Disp: 18 tablet, Rfl: 0   vitamin C (ASCORBIC ACID) 500 MG tablet, Take 500 mg by mouth daily., Disp: , Rfl:   Allergies  Allergen  Reactions   Azelastine Itching and Anaphylaxis   Iodinated Contrast Media Other (See Comments)   Sulfa Antibiotics Hives    ROS CONSTITUTIONAL: Negative for chills, fatigue, fever,  E/N/T:mild sinus congestion CARDIOVASCULAR: Negative for chest pain, dizziness, palpitations and pedal edema.  RESPIRATORY: see HPI INTEGUMENTARY: Negative for rash.       Objective:    PHYSICAL EXAM:   BP (!) 158/94   Pulse 74   Temp 97.8 F (36.6 C) (Temporal)   Ht 5' 2 (1.575 m)   Wt 211 lb (95.7 kg)   SpO2 98%   BMI 38.59 kg/m    GEN: Well nourished, well developed, in no acute distress  Oropharynx - normal mucosa, palate, and posterior pharynx Cardiac: RRR; no murmurs, rubs,  Respiratory:  scattered rhonchi with rare wheeze Psych: euthymic mood, appropriate affect and demeanor     Assessment & Plan:    Bronchitis -     Amoxicillin -Pot Clavulanate; Take 1 tablet by mouth 2 (two) times daily.  Dispense: 20 tablet; Refill: 0 -     predniSONE ; 1 po tid for 3 days then 1 po bid for 3 days then 1 po qd for 3 days  Dispense: 18 tablet; Refill: 0  Benign hypertension -  dilTIAZem  HCl ER Coated Beads; Take 1 capsule (240 mg total) by mouth daily.  Dispense: 30 capsule; Refill: 2     Follow-up: Return for on 12/18 for nurse visit bp check (and due for lab also).  An After Visit Summary was printed and given to the patient.  CAMIE JONELLE NICHOLAUS DEVONNA [facility] [phone]

## 2024-10-19 NOTE — Addendum Note (Signed)
 Addended by: NICHOLAUS CREDIT on: 10/19/2024 12:16 PM   Modules accepted: Level of Service

## 2024-10-26 ENCOUNTER — Telehealth: Payer: Self-pay

## 2024-10-26 ENCOUNTER — Telehealth: Payer: Self-pay | Admitting: Physician Assistant

## 2024-10-26 NOTE — Telephone Encounter (Signed)
 My chart message sent

## 2024-10-26 NOTE — Telephone Encounter (Signed)
 Called patient left message that letter from provider is ready to pick up and left up front.

## 2024-10-26 NOTE — Telephone Encounter (Signed)
 Copied from CRM #[id number]. 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

## 2024-10-26 NOTE — Telephone Encounter (Signed)
 Please call patient about this issue - we do not usually recommend a month off work with bronchitis - what exactly does she need for note to say?

## 2024-10-26 NOTE — Telephone Encounter (Signed)
 Copied from CRM #[id number]. Topic: Clinical - Medical Advice >> Oct 26, 2024 10:44 AM Winona R wrote: Pt would like to know if she should come back in this week for a follow up as she has competed the two rounds of antibiotics as og today. Pt feels better but still feels a raspy feeling in her throat, with a dry cough.

## 2024-10-26 NOTE — Telephone Encounter (Signed)
 Pt can keep the congestion for several days/week after completing antibiotics - if feeling better and just having the dry cough recommend to treat symptoms and if persists into next week then make follow up appt

## 2024-10-28 ENCOUNTER — Ambulatory Visit (INDEPENDENT_AMBULATORY_CARE_PROVIDER_SITE_OTHER): Admitting: Family Medicine

## 2024-10-28 ENCOUNTER — Encounter: Payer: Self-pay | Admitting: Family Medicine

## 2024-10-28 ENCOUNTER — Other Ambulatory Visit: Payer: Self-pay

## 2024-10-28 ENCOUNTER — Ambulatory Visit (INDEPENDENT_AMBULATORY_CARE_PROVIDER_SITE_OTHER)
Admission: RE | Admit: 2024-10-28 | Discharge: 2024-10-28 | Disposition: A | Discharge status: Home / Self Care | Source: Ambulatory Visit | Attending: Family Medicine | Admitting: Family Medicine

## 2024-10-28 ENCOUNTER — Ambulatory Visit: Payer: Self-pay | Admitting: Family Medicine

## 2024-10-28 VITALS — BP 130/76 | HR 92 | Temp 97.9°F | Ht 62.0 in | Wt 213.0 lb

## 2024-10-28 DIAGNOSIS — R053 Chronic cough: Secondary | ICD-10-CM | POA: Insufficient documentation

## 2024-10-28 DIAGNOSIS — R0602 Shortness of breath: Secondary | ICD-10-CM

## 2024-10-28 DIAGNOSIS — D72828 Other elevated white blood cell count: Secondary | ICD-10-CM | POA: Diagnosis not present

## 2024-10-28 DIAGNOSIS — I10 Essential (primary) hypertension: Secondary | ICD-10-CM | POA: Diagnosis not present

## 2024-10-28 DIAGNOSIS — R0609 Other forms of dyspnea: Secondary | ICD-10-CM | POA: Diagnosis not present

## 2024-10-28 DIAGNOSIS — R5383 Other fatigue: Secondary | ICD-10-CM | POA: Diagnosis not present

## 2024-10-28 DIAGNOSIS — D72829 Elevated white blood cell count, unspecified: Secondary | ICD-10-CM | POA: Insufficient documentation

## 2024-10-28 NOTE — Assessment & Plan Note (Signed)
 Checking labs.  Rule out mono  If no source, recommend a sleep aid to help with length of sleep

## 2024-10-28 NOTE — Patient Instructions (Addendum)
°  VISIT SUMMARY: YOUR PLAN: ACUTE BRONCHITIS: You have persistent symptoms despite completing antibiotics and prednisone . Your flu and COVID tests were negative. -Monitor your symptoms and let us  know if there is no improvement. -Checking blood count, vitamin d , b12 and folate levels.  -Checking for mono infection  FATIGUE: You have been experiencing severe fatigue since the onset of bronchitis. There is no sign of anemia or iron deficiency. -Monitor your fatigue and let us  know if there is no improvement.  HYPERTENSION: Your hypertension is well-controlled with your current treatment. -Continue with your current hypertension management.

## 2024-10-28 NOTE — Assessment & Plan Note (Signed)
 Peak flows normal. Orders:   Peak flow

## 2024-10-28 NOTE — Assessment & Plan Note (Signed)
 Improving.

## 2024-10-28 NOTE — Progress Notes (Signed)
 Acute Office Visit  Subjective:    Patient ID: Desiree Holmes, female    DOB: [birth year], 63 y.o.   MRN: [id number]  Chief Complaint  Patient presents with   Bronchitis    C/o chest discomfort burns, fatigue. No fever or achy. Just finished second round of axbx.     Discussed the use of AI scribe software for clinical note transcription with the patient, who gave verbal consent to proceed.  History of Present Illness Desiree Holmes is a 63 year old female who presents with persistent fatigue and respiratory symptoms following a diagnosis of bronchitis.  Fatigue and decreased energy - Onset around November 4th - Severe fatigue described as legs not carrying her through the day - Extreme fatigue persists despite multiple courses of antibiotics and prednisone  - Impaired ability to work due to lack of energy - No numbness or tingling in extremities  Respiratory symptoms - Diagnosed with bronchitis with initial white blood cell count of 15,800, now decreased to 11,300 - No fever, chills, or significant cough production - Persistent change in voice - Burning sensation with movement - No earaches, sore throat, or nasal congestion - Occasional slight twinge in right ear  Appetite and sleep disturbances - Significant increase in appetite, attributed to prednisone  - Poor sleep quality, chronic and unchanged  Facial swelling - Facial swelling observed, commented on by a friend - Completed prednisone  course two days ago  Cardiac evaluation - Evaluated for potential cardiac issues; heart function reported as normal except for pending heart valve assessment  Hypertension - History of controlled hypertension    Past Medical History:  Diagnosis Date   Allergy    Barrett's esophagus    GERD (gastroesophageal reflux disease)    H. pylori infection    History of colon polyps    Hypertension     Past Surgical History:  Procedure Laterality Date    ABDOMINAL HYSTERECTOMY     with BSO   CESAREAN SECTION     x2   DIAGNOSTIC LAPAROSCOPY     DILATION AND CURETTAGE OF UTERUS     MINI LAP STERILIZATION     TOTAL ABDOMINAL HYSTERECTOMY W/ BILATERAL SALPINGOOPHORECTOMY     TUBOPLASTY / TUBOTUBAL ANASTOMOSIS      Family History  Problem Relation Age of Onset   COPD Father    Cancer Father        STOMACH   Diabetes Mother    Hypertension Mother    Thyroid  disease Mother    Lupus Sister    Diabetes Maternal Grandmother    Heart disease Maternal Grandfather    Heart disease Paternal Grandfather    Cancer Paternal Grandfather        COLON   Breast cancer Neg Hx     Social History   Socioeconomic History   Marital status: Married    Spouse name: Not on file   Number of children: 1   Years of education: Not on file   Highest education level: Some college, no degree  Occupational History   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  Tobacco Use   Smoking status: Never   Smokeless tobacco: Never  Vaping Use   Vaping status: Never Used  Substance and Sexual Activity   Alcohol use: No    Alcohol/week: 0.0 standard drinks of alcohol   Drug use: No   Sexual activity: Not Currently    Birth control/protection: Surgical    Comment: 1st intercourse 16 yo-5 partners  Other Topics Concern  Not on file  Social History Narrative   Not on file   Social Drivers of Health   Tobacco Use: Low Risk (10/28/2024)   Patient History    Smoking Tobacco Use: Never    Smokeless Tobacco Use: Never    Passive Exposure: Not on file  Financial Resource Strain: Low Risk (02/02/2024)   Overall Financial Resource Strain (CARDIA)    Difficulty of Paying Living Expenses: Not hard at all  Food Insecurity: No Food Insecurity (10/05/2024)   Epic    Worried About Programme Researcher, Broadcasting/film/video in the Last Year: Never true    Ran Out of Food in the Last Year: Never true  Transportation Needs: No Transportation Needs (10/05/2024)   Epic    Lack of Transportation (Medical):  No    Lack of Transportation (Non-Medical): No  Physical Activity: Unknown (10/05/2024)   Exercise Vital Sign    Days of Exercise per Week: 5 days    Minutes of Exercise per Session: Patient declined  Stress: No Stress Concern Present (10/05/2024)   [employer] of Occupational Health - Occupational Stress Questionnaire    Feeling of Stress: Only a little  Social Connections: Moderately Integrated (10/05/2024)   Social Connection and Isolation Panel    Frequency of Communication with Friends and Family: Three times a week    Frequency of Social Gatherings with Friends and Family: Twice a week    Attends Religious Services: More than 4 times per year    Active Member of Clubs or Organizations: No    Attends Banker Meetings: Patient declined    Marital Status: Married  Catering Manager Violence: Not At Risk (10/05/2024)   Epic    Fear of Current or Ex-Partner: No    Emotionally Abused: No    Physically Abused: No    Sexually Abused: No  Depression (PHQ2-9): Low Risk (10/05/2024)   Depression (PHQ2-9)    PHQ-2 Score: 0  Alcohol Screen: Low Risk (06/10/2023)   Alcohol Screen    Last Alcohol Screening Score (AUDIT): 0  Housing: Low Risk (10/05/2024)   Epic    Unable to Pay for Housing in the Last Year: No    Number of Times Moved in the Last Year: 0    Homeless in the Last Year: No  Utilities: Not At Risk (10/05/2024)   Epic    Threatened with loss of utilities: No  Health Literacy: Adequate Health Literacy (06/10/2023)   B1300 Health Literacy    Frequency of need for help with medical instructions: Never    Outpatient Medications Prior to Visit  Medication Sig Dispense Refill   albuterol  (VENTOLIN  HFA) 108 (90 Base) MCG/ACT inhaler Inhale 2 puffs into the lungs every 6 (six) hours as needed for wheezing or shortness of breath. 8 g 2   benzonatate  (TESSALON ) 100 MG capsule Take 1 capsule (100 mg total) by mouth 2 (two) times daily as needed for cough. 20  capsule 0   Biotin 1000 MCG CHEW Chew by mouth daily.     Calcium Carb-Cholecalciferol (SM CALCIUM 500/VITAMIN D3 PO) Take by mouth.     cetirizine (ZYRTEC) 10 MG tablet Take 10 mg by mouth daily.     diltiazem  (CARDIZEM  CD) 240 MG 24 hr capsule Take 1 capsule (240 mg total) by mouth daily. 30 capsule 2   estradiol  (ESTRACE ) 1 MG tablet TAKE 1 TABLET(1 MG) BY MOUTH DAILY 90 tablet 3   losartan  (COZAAR ) 100 MG tablet TAKE 1 TABLET(100 MG) BY MOUTH DAILY  90 tablet 1   Omega-3 Fatty Acids (FISH OIL) 1000 MG CAPS Take by mouth.     omeprazole  (PRILOSEC) 40 MG capsule TAKE 1 CAPSULE BY MOUTH EVERY DAY 90 capsule 1   vitamin C (ASCORBIC ACID) 500 MG tablet Take 500 mg by mouth daily.     amoxicillin -clavulanate (AUGMENTIN ) 875-125 MG tablet Take 1 tablet by mouth 2 (two) times daily. 20 tablet 0   predniSONE  (DELTASONE ) 20 MG tablet 1 po tid for 3 days then 1 po bid for 3 days then 1 po qd for 3 days 18 tablet 0   No facility-administered medications prior to visit.    Allergies[1]  Review of Systems     Objective:        10/28/2024    2:03 PM 10/19/2024    9:11 AM 10/19/2024    8:37 AM  Vitals with BMI  Height 5' 2  5' 2  Weight 213 lbs  211 lbs  BMI 38.95  38.58  Systolic 130 158 847  Diastolic 76 94 98  Pulse 92  74    No data found.   Physical Exam Vitals reviewed.  Constitutional:      Appearance: Normal appearance. She is obese.  HENT:     Right Ear: Tympanic membrane, ear canal and external ear normal.     Left Ear: Tympanic membrane, ear canal and external ear normal.     Nose: Nose normal.     Mouth/Throat:     Pharynx: Oropharynx is clear.  Cardiovascular:     Rate and Rhythm: Normal rate and regular rhythm.     Heart sounds: Normal heart sounds. No murmur heard. Pulmonary:     Effort: Pulmonary effort is normal. No respiratory distress.     Breath sounds: Normal breath sounds.  Lymphadenopathy:     Cervical: No cervical adenopathy.  Neurological:      Mental Status: She is alert and oriented to person, place, and time.  Psychiatric:        Mood and Affect: Mood normal.        Behavior: Behavior normal.     There are no preventive care reminders to display for this patient.  There are no preventive care reminders to display for this patient.   Lab Results  Component Value Date   TSH 1.550 10/05/2024   Lab Results  Component Value Date   WBC 11.3 (H) 10/05/2024   HGB 15.5 10/05/2024   HCT 46.8 (H) 10/05/2024   MCV 88 10/05/2024   PLT 480 (H) 10/05/2024   Lab Results  Component Value Date   NA 139 10/05/2024   K 4.2 10/05/2024   CO2 19 (L) 10/05/2024   GLUCOSE 85 10/05/2024   BUN 13 10/05/2024   CREATININE 0.68 10/05/2024   BILITOT 0.5 10/05/2024   ALKPHOS 100 10/05/2024   AST 17 10/05/2024   ALT 15 10/05/2024   PROT 7.0 10/05/2024   ALBUMIN 4.0 10/05/2024   CALCIUM 9.9 10/05/2024   EGFR 97 10/05/2024   Lab Results  Component Value Date   CHOL 186 10/05/2024   Lab Results  Component Value Date   HDL 55 10/05/2024   Lab Results  Component Value Date   LDLCALC 108 (H) 10/05/2024   Lab Results  Component Value Date   TRIG 129 10/05/2024   Lab Results  Component Value Date   CHOLHDL 3.4 10/05/2024   No results found for: HGBA1C      Results for orders placed or performed  in visit on 10/05/24  CBC with Differential/Platelet   Collection Time: 10/05/24 11:23 AM  Result Value Ref Range   WBC 11.3 (H) 3.4 - 10.8 x10E3/uL   RBC 5.32 (H) 3.77 - 5.28 x10E6/uL   Hemoglobin 15.5 11.1 - 15.9 g/dL   Hematocrit 53.1 (H) 65.9 - 46.6 %   MCV 88 79 - 97 fL   MCH 29.1 26.6 - 33.0 pg   MCHC 33.1 31.5 - 35.7 g/dL   RDW 87.0 88.2 - 84.5 %   Platelets 480 (H) 150 - 450 x10E3/uL   Neutrophils 65 Not Estab. %   Lymphs 26 Not Estab. %   Monocytes 7 Not Estab. %   Eos 1 Not Estab. %   Basos 0 Not Estab. %   Neutrophils Absolute 7.4 (H) 1.4 - 7.0 x10E3/uL   Lymphocytes Absolute 3.0 0.7 - 3.1 x10E3/uL    Monocytes Absolute 0.7 0.1 - 0.9 x10E3/uL   EOS (ABSOLUTE) 0.1 0.0 - 0.4 x10E3/uL   Basophils Absolute 0.0 0.0 - 0.2 x10E3/uL   Immature Granulocytes 1 Not Estab. %   Immature Grans (Abs) 0.1 0.0 - 0.1 x10E3/uL  Comprehensive metabolic panel with GFR   Collection Time: 10/05/24 11:23 AM  Result Value Ref Range   Glucose 85 70 - 99 mg/dL   BUN 13 8 - 27 mg/dL   Creatinine, Ser 9.31 0.57 - 1.00 mg/dL   eGFR 97 >40 fO/fpw/8.26   BUN/Creatinine Ratio 19 12 - 28   Sodium 139 134 - 144 mmol/L   Potassium 4.2 3.5 - 5.2 mmol/L   Chloride 101 96 - 106 mmol/L   CO2 19 (L) 20 - 29 mmol/L   Calcium 9.9 8.7 - 10.3 mg/dL   Total Protein 7.0 6.0 - 8.5 g/dL   Albumin 4.0 3.9 - 4.9 g/dL   Globulin, Total 3.0 1.5 - 4.5 g/dL   Bilirubin Total 0.5 0.0 - 1.2 mg/dL   Alkaline Phosphatase 100 49 - 135 IU/L   AST 17 0 - 40 IU/L   ALT 15 0 - 32 IU/L  Lipid panel   Collection Time: 10/05/24 11:23 AM  Result Value Ref Range   Cholesterol, Total 186 100 - 199 mg/dL   Triglycerides 870 0 - 149 mg/dL   HDL 55 >60 mg/dL   VLDL Cholesterol Cal 23 5 - 40 mg/dL   LDL Chol Calc (NIH) 891 (H) 0 - 99 mg/dL   Chol/HDL Ratio 3.4 0.0 - 4.4 ratio  TSH   Collection Time: 10/05/24 11:23 AM  Result Value Ref Range   TSH 1.550 0.450 - 4.500 uIU/mL     Assessment & Plan:   Assessment & Plan Other fatigue Checking labs.  Rule out mono  If no source, recommend a sleep aid to help with length of sleep    Chronic cough Improving.     Dyspnea on exertion Peak flows normal. Orders:   Peak flow  Obesity, morbid (HCC) Comorbity: GERD Check labs.   Orders:   EPSTEIN-BARR VIRUS (EBV) Antibody Profile   B12 and Folate Panel   VITAMIN D  25 Hydroxy (Vit-D Deficiency, Fractures)  Other elevated white blood cell (WBC) count Check blood count.  Orders:   CBC with Differential/Platelet  Primary hypertension Well controlled. The current medical regimen is effective;  continue present plan and  medications.        Body mass index is 38.96 kg/m.SABRA  ANo orders of the defined types were placed in this encounter.   Orders Placed This Encounter  Procedures   EPSTEIN-BARR VIRUS (EBV) Antibody Profile   B12 and Folate Panel   VITAMIN D  25 Hydroxy (Vit-D Deficiency, Fractures)   CBC with Differential/Platelet   Peak flow     Follow-up: Return in about 1 week (around 11/04/2024).  An After Visit Summary was printed and given to the patient.  Abigail Free, MD [facility] [phone]    [1]  Allergies Allergen Reactions   Azelastine Itching and Anaphylaxis   Iodinated Contrast Media Other (See Comments)   Sulfa Antibiotics Hives

## 2024-10-28 NOTE — Assessment & Plan Note (Signed)
  Orders:   CBC with Differential/Platelet

## 2024-10-28 NOTE — Assessment & Plan Note (Signed)
Well controlled.  The current medical regimen is effective;  continue present plan and medications. 

## 2024-10-28 NOTE — Assessment & Plan Note (Signed)
 Comorbity: GERD Check labs.   Orders:   EPSTEIN-BARR VIRUS (EBV) Antibody Profile   B12 and Folate Panel   VITAMIN D  25 Hydroxy (Vit-D Deficiency, Fractures)

## 2024-10-30 LAB — CBC WITH DIFFERENTIAL/PLATELET
Basophils Absolute: 0.1 x10E3/uL (ref 0.0–0.2)
Basos: 0 %
EOS (ABSOLUTE): 0.2 x10E3/uL (ref 0.0–0.4)
Eos: 1 %
Hematocrit: 43.9 % (ref 34.0–46.6)
Hemoglobin: 14.6 g/dL (ref 11.1–15.9)
Immature Grans (Abs): 0.3 x10E3/uL — ABNORMAL HIGH (ref 0.0–0.1)
Immature Granulocytes: 2 %
Lymphocytes Absolute: 5.3 x10E3/uL — ABNORMAL HIGH (ref 0.7–3.1)
Lymphs: 33 %
MCH: 29.6 pg (ref 26.6–33.0)
MCHC: 33.3 g/dL (ref 31.5–35.7)
MCV: 89 fL (ref 79–97)
Monocytes Absolute: 1 x10E3/uL — ABNORMAL HIGH (ref 0.1–0.9)
Monocytes: 6 %
Neutrophils Absolute: 9.3 x10E3/uL — ABNORMAL HIGH (ref 1.4–7.0)
Neutrophils: 58 %
Platelets: 299 x10E3/uL (ref 150–450)
RBC: 4.93 x10E6/uL (ref 3.77–5.28)
RDW: 13.3 % (ref 11.7–15.4)
WBC: 16.1 x10E3/uL — ABNORMAL HIGH (ref 3.4–10.8)

## 2024-10-30 LAB — EPSTEIN-BARR VIRUS (EBV) ANTIBODY PROFILE
EBV NA IgG: >600 U/mL — ABNORMAL HIGH (ref 0.0–17.9)
EBV VCA IgG: 511 U/mL — ABNORMAL HIGH (ref 0.0–17.9)
EBV VCA IgM: 105 U/mL — ABNORMAL HIGH (ref 0.0–35.9)

## 2024-10-30 LAB — B12 AND FOLATE PANEL
Folate: 8.5 ng/mL (ref >3.0)
Vitamin B-12: 343 pg/mL (ref 232–1245)

## 2024-10-30 LAB — VITAMIN D 25 HYDROXY (VIT D DEFICIENCY, FRACTURES): Vit D, 25-Hydroxy: 22.9 ng/mL — ABNORMAL LOW (ref 30.0–100.0)

## 2024-10-31 ENCOUNTER — Ambulatory Visit: Payer: Self-pay | Admitting: Family Medicine

## 2024-11-01 ENCOUNTER — Telehealth: Payer: Self-pay

## 2024-11-01 ENCOUNTER — Other Ambulatory Visit: Payer: Self-pay

## 2024-11-01 MED ORDER — VITAMIN D (ERGOCALCIFEROL) 1.25 MG (50000 UNIT) PO CAPS: 50000.0000 [IU] | ORAL_CAPSULE | ORAL | 2 refills | Status: AC

## 2024-11-01 NOTE — Telephone Encounter (Signed)
 Copied from CRM #[id number]. Topic: Clinical - Lab/Test Results >> Nov 01, 2024  2:10 PM Ashley R wrote: Reason for CRM: Requesting callback with test results. [phone]  Would like notification in chart, if results are positive, that the condition could have been ongoing for some time before test was completed. As she has taken herself out of work and they are asking for evidence as she came in on several occasions with same symptoms before test was run.  ----------------------------------------------------------------------- From previous Reason for Contact - Other: Reason for CRM:

## 2024-11-03 NOTE — Telephone Encounter (Signed)
 Done. Dr. Sherre

## 2024-11-04 ENCOUNTER — Other Ambulatory Visit

## 2024-11-04 ENCOUNTER — Ambulatory Visit

## 2024-11-04 NOTE — Progress Notes (Signed)
 I had discussed with patient and sent a note to excuse her from work . Dr. Sherre

## 2024-11-08 ENCOUNTER — Other Ambulatory Visit: Payer: Self-pay

## 2024-11-08 DIAGNOSIS — D72828 Other elevated white blood cell count: Secondary | ICD-10-CM

## 2024-11-09 ENCOUNTER — Encounter: Payer: Self-pay | Admitting: Physician Assistant

## 2024-11-09 ENCOUNTER — Other Ambulatory Visit

## 2024-11-09 ENCOUNTER — Ambulatory Visit: Admitting: Physician Assistant

## 2024-11-09 ENCOUNTER — Ambulatory Visit

## 2024-11-09 VITALS — BP 132/100 | HR 83 | Temp 98.2°F | Ht 62.0 in | Wt 214.6 lb

## 2024-11-09 DIAGNOSIS — I10 Essential (primary) hypertension: Secondary | ICD-10-CM

## 2024-11-09 DIAGNOSIS — R899 Unspecified abnormal finding in specimens from other organs, systems and tissues: Secondary | ICD-10-CM

## 2024-11-09 DIAGNOSIS — H1033 Unspecified acute conjunctivitis, bilateral: Secondary | ICD-10-CM

## 2024-11-09 LAB — COMPREHENSIVE METABOLIC PANEL WITH GFR
ALT: 22 IU/L (ref 0–32)
AST: 23 IU/L (ref 0–40)
Albumin: 3.9 g/dL (ref 3.9–4.9)
Alkaline Phosphatase: 83 IU/L (ref 49–135)
BUN/Creatinine Ratio: 13 (ref 12–28)
BUN: 9 mg/dL (ref 8–27)
Bilirubin Total: 0.8 mg/dL (ref 0.0–1.2)
CO2: 18 mmol/L — ABNORMAL LOW (ref 20–29)
Calcium: 9.4 mg/dL (ref 8.7–10.3)
Chloride: 102 mmol/L (ref 96–106)
Creatinine, Ser: 0.68 mg/dL (ref 0.57–1.00)
Globulin, Total: 2.2 g/dL (ref 1.5–4.5)
Glucose: 67 mg/dL — ABNORMAL LOW (ref 70–99)
Potassium: 4 mmol/L (ref 3.5–5.2)
Sodium: 140 mmol/L (ref 134–144)
Total Protein: 6.1 g/dL (ref 6.0–8.5)
eGFR: 97 mL/min/1.73

## 2024-11-09 LAB — CBC WITH DIFFERENTIAL/PLATELET
Basophils Absolute: 0 x10E3/uL (ref 0.0–0.2)
Basos: 0 %
EOS (ABSOLUTE): 0.1 x10E3/uL (ref 0.0–0.4)
Eos: 1 %
Hematocrit: 42.7 % (ref 34.0–46.6)
Hemoglobin: 13.8 g/dL (ref 11.1–15.9)
Immature Grans (Abs): 0 x10E3/uL (ref 0.0–0.1)
Immature Granulocytes: 0 %
Lymphocytes Absolute: 2.5 x10E3/uL (ref 0.7–3.1)
Lymphs: 26 %
MCH: 29.3 pg (ref 26.6–33.0)
MCHC: 32.3 g/dL (ref 31.5–35.7)
MCV: 91 fL (ref 79–97)
Monocytes Absolute: 0.4 x10E3/uL (ref 0.1–0.9)
Monocytes: 4 %
Neutrophils Absolute: 6.9 x10E3/uL (ref 1.4–7.0)
Neutrophils: 69 %
Platelets: 254 x10E3/uL (ref 150–450)
RBC: 4.71 x10E6/uL (ref 3.77–5.28)
RDW: 13.3 % (ref 11.7–15.4)
WBC: 10 x10E3/uL (ref 3.4–10.8)

## 2024-11-09 MED ORDER — GENTAMICIN SULFATE 0.3 % OP SOLN: 2.0000 [drp] | Freq: Three times a day (TID) | OPHTHALMIC | 0 refills | Status: DC

## 2024-11-09 MED ORDER — HYDROCHLOROTHIAZIDE 25 MG PO TABS: 25.0000 mg | ORAL_TABLET | Freq: Every day | ORAL | 3 refills | Status: AC

## 2024-11-09 NOTE — Progress Notes (Signed)
 Patient changing to in person visit.

## 2024-11-09 NOTE — Progress Notes (Signed)
 "  Subjective:  Patient ID: Desiree Holmes, female    DOB: 06/07/1961  Age: 63 y.o. MRN: [id number]  Chief Complaint  Patient presents with   Sinus Problem    HPI Pt initially in for blood pressure check - she has been checking at home and it has been ranging 150s/70-80s and today in office initially elevated at 132/100 -- denies chest pain or dyspnea Currently on diltiazem  240mg  qd and losartan  100mg  qd  Pt complains of both eyes being red and 'gunky' in the mornings and having some drainage in eye through day - denies fever or malaise.  Has had a dry cough - currently also recovering from mono She is due to repeat labwork after having elevated wbc few weeks ago (but had also been on prednisone )     11/09/2024   10:33 AM 10/05/2024   10:19 AM 04/06/2024    3:19 PM 11/18/2023    8:14 AM 06/10/2023    2:14 PM  Depression screen PHQ 2/9  Decreased Interest 0 0 0 0 0  Down, Depressed, Hopeless 0 0 0 1 0  PHQ - 2 Score 0 0 0 1 0  Altered sleeping    1 2  Tired, decreased energy    1 1  Change in appetite    1 1  Feeling bad or failure about yourself     1 1  Trouble concentrating    0 0  Moving slowly or fidgety/restless    0 0  Suicidal thoughts    0 0  PHQ-9 Score    5  5   Difficult doing work/chores    Not difficult at all Not difficult at all     Data saved with a previous flowsheet row definition        06/10/2023    2:14 PM 11/18/2023    8:13 AM 03/31/2024    4:00 PM 10/05/2024    8:34 AM 11/09/2024   10:32 AM  Fall Risk  Falls in the past year? 0 0 0 0  0  Was there an injury with Fall? 0  0  0  0   0  Fall Risk Category Calculator 0 0 0 0  0  Patient at Risk for Falls Due to No Fall Risks No Fall Risks  No Fall Risks No Fall Risks  Fall risk Follow up Falls evaluation completed Falls evaluation completed  Falls evaluation completed Falls evaluation completed     Manually entered by patient   Data saved with a previous flowsheet row definition      ROS CONSTITUTIONAL: has had general malaise E/N/T: Negative for ear pain, nasal congestion and sore throat.  Eyes - see HPI CARDIOVASCULAR: Negative for chest pain, dizziness, palpitations  RESPIRATORY: Negative for recent cough and dyspnea.  GASTROINTESTINAL: Negative for abdominal pain, acid reflux symptoms, constipation, diarrhea, nausea and vomiting.  INTEGUMENTARY: Negative for rash.    Current Medications[1]  Past Medical History:  Diagnosis Date   Allergy    Barrett's esophagus    GERD (gastroesophageal reflux disease)    H. pylori infection    History of colon polyps    Hypertension    Objective:  PHYSICAL EXAM:   BP (!) 132/100   Pulse 83   Temp 98.2 F (36.8 C)   Ht 5' 2 (1.575 m)   Wt 214 lb 9.6 oz (97.3 kg)   SpO2 96%   BMI 39.25 kg/m    GEN: Well nourished, well developed, in no  acute distress  HEENT: normal external ears and nose - normal external auditory canals and TMS - - Lips, Teeth and Gums - normal  Eyes - left eye normal - right eye with redness and conjunctiva injected - drainage noted Oropharynx - normal mucosa, palate, and posterior pharynx Cardiac: RRR; no murmurs, rubs, or gallops,no edema - Respiratory:  normal respiratory rate and pattern with no distress - normal breath sounds with no rales, rhonchi, wheezes or rubs Skin: warm and dry, no rash  Neuro:  Alert and Oriented x 3, - CN II-Xii grossly intact Psych: euthymic mood, appropriate affect and demeanor  Assessment & Plan:    Primary hypertension -     hydroCHLOROthiazide ; Take 1 tablet (25 mg total) by mouth daily.  Dispense: 90 tablet; Refill: 3 Continue other meds as directed Acute conjunctivitis of both eyes, unspecified acute conjunctivitis type -     Gentamicin  Sulfate; Place 2 drops into both eyes 3 (three) times daily.  Dispense: 5 mL; Refill: 0  Abnormal laboratory test -     CBC with Differential/Platelet -     Comprehensive metabolic panel with GFR      Follow-up: Return in about 4 weeks (around 12/07/2024) for follow-up.  An After Visit Summary was printed and given to the patient.  SARA R Nelson Noone, PA-C [facility] [phone]    [1]  Current Outpatient Medications:    Biotin 1000 MCG CHEW, Chew by mouth daily., Disp: , Rfl:    Calcium Carb-Cholecalciferol (SM CALCIUM 500/VITAMIN D3 PO), Take by mouth., Disp: , Rfl:    cetirizine (ZYRTEC) 10 MG tablet, Take 10 mg by mouth daily., Disp: , Rfl:    diltiazem  (CARDIZEM  CD) 240 MG 24 hr capsule, Take 1 capsule (240 mg total) by mouth daily., Disp: 30 capsule, Rfl: 2   estradiol  (ESTRACE ) 1 MG tablet, TAKE 1 TABLET(1 MG) BY MOUTH DAILY, Disp: 90 tablet, Rfl: 3   gentamicin  (GARAMYCIN ) 0.3 % ophthalmic solution, Place 2 drops into both eyes 3 (three) times daily., Disp: 5 mL, Rfl: 0   hydrochlorothiazide  (HYDRODIURIL ) 25 MG tablet, Take 1 tablet (25 mg total) by mouth daily., Disp: 90 tablet, Rfl: 3   losartan  (COZAAR ) 100 MG tablet, TAKE 1 TABLET(100 MG) BY MOUTH DAILY, Disp: 90 tablet, Rfl: 1   Omega-3 Fatty Acids (FISH OIL) 1000 MG CAPS, Take by mouth., Disp: , Rfl:    omeprazole  (PRILOSEC) 40 MG capsule, TAKE 1 CAPSULE BY MOUTH EVERY DAY, Disp: 90 capsule, Rfl: 1   vitamin C (ASCORBIC ACID) 500 MG tablet, Take 500 mg by mouth daily., Disp: , Rfl:    Vitamin D , Ergocalciferol , (DRISDOL ) 1.25 MG (50000 UNIT) CAPS capsule, Take 1 capsule (50,000 Units total) by mouth every 7 (seven) days., Disp: 5 capsule, Rfl: 2   albuterol  (VENTOLIN  HFA) 108 (90 Base) MCG/ACT inhaler, Inhale 2 puffs into the lungs every 6 (six) hours as needed for wheezing or shortness of breath. (Patient not taking: Reported on 11/09/2024), Disp: 8 g, Rfl: 2  "

## 2024-11-10 ENCOUNTER — Ambulatory Visit: Payer: Self-pay | Admitting: Physician Assistant

## 2024-11-19 ENCOUNTER — Telehealth: Payer: Self-pay

## 2024-11-19 ENCOUNTER — Encounter: Payer: Self-pay | Admitting: Family Medicine

## 2024-11-19 NOTE — Telephone Encounter (Signed)
 Copied from CRM #[id number]. Topic: Clinical - Medical Advice >> Nov 17, 2024 12:06 PM Delon DASEN wrote: Reason for CRM: Patient asking for the doctor to sign her out of work for a couple  more weeks, she is very tired. After leaving appt on 12/23 she went home and ended up with vomiting and diarrhea.  It has resolved since then.- Please call to advise  [phone]

## 2024-11-19 NOTE — Telephone Encounter (Signed)
 Copied from CRM #[id number]. Topic: Clinical - Medical Advice >> Nov 17, 2024 12:06 PM Delon DASEN wrote: Reason for CRM: Patient asking for the doctor to sign her out of work for a couple  more weeks, she is very tired. After leaving appt on 12/23 she went home and ended up with vomiting and diarrhea.  It has resolved since then.- Please call to advise  [phone] >> Nov 19, 2024 12:15 PM Amy B wrote: 2nd attempt:  Patient called asking for the doctor to sign her out of work for 2 more weeks.  Please contact patient.

## 2024-11-23 ENCOUNTER — Encounter (HOSPITAL_COMMUNITY): Payer: Self-pay

## 2024-11-25 ENCOUNTER — Ambulatory Visit (HOSPITAL_BASED_OUTPATIENT_CLINIC_OR_DEPARTMENT_OTHER)
Admission: RE | Admit: 2024-11-25 | Discharge: 2024-11-25 | Disposition: A | Discharge status: Home / Self Care | Source: Ambulatory Visit | Attending: Cardiology | Admitting: Cardiology

## 2024-11-25 ENCOUNTER — Encounter (HOSPITAL_BASED_OUTPATIENT_CLINIC_OR_DEPARTMENT_OTHER): Payer: Self-pay

## 2024-11-25 DIAGNOSIS — R072 Precordial pain: Secondary | ICD-10-CM

## 2024-11-25 MED ORDER — NITROGLYCERIN 0.4 MG SL SUBL: 0.8000 mg | SUBLINGUAL_TABLET | Freq: Once | SUBLINGUAL | Status: DC

## 2024-11-25 NOTE — Progress Notes (Signed)
 Unable to complete CT scan today, 3 attempted PIVS.

## 2024-11-29 ENCOUNTER — Telehealth (HOSPITAL_COMMUNITY): Payer: Self-pay | Admitting: Emergency Medicine

## 2024-11-29 NOTE — Telephone Encounter (Signed)
 Attempted to call patient regarding upcoming cardiac CT appointment. Left message on voicemail with name and callback number Rockwell Alexandria RN Navigator Cardiac [facility] [phone] Office [phone] Cell

## 2024-11-30 ENCOUNTER — Ambulatory Visit (HOSPITAL_COMMUNITY)
Admission: RE | Admit: 2024-11-30 | Discharge: 2024-11-30 | Disposition: A | Discharge status: Home / Self Care | Source: Ambulatory Visit | Attending: Cardiology | Admitting: Cardiology

## 2024-11-30 DIAGNOSIS — I2510 Atherosclerotic heart disease of native coronary artery without angina pectoris: Secondary | ICD-10-CM

## 2024-11-30 DIAGNOSIS — R072 Precordial pain: Secondary | ICD-10-CM | POA: Insufficient documentation

## 2024-11-30 MED ORDER — METOPROLOL TARTRATE 5 MG/5ML IV SOLN
10.0000 mg | INTRAVENOUS | Status: DC | PRN
  Administered 2024-11-30: 5 mg via INTRAVENOUS

## 2024-11-30 MED ORDER — DIPHENHYDRAMINE HCL 50 MG/ML IJ SOLN: 50.0000 mg | Freq: Once | INTRAMUSCULAR | Status: DC

## 2024-11-30 MED ORDER — NITROGLYCERIN 0.4 MG SL SUBL
0.8000 mg | SUBLINGUAL_TABLET | Freq: Once | SUBLINGUAL | Status: AC
  Administered 2024-11-30: 0.8 mg via SUBLINGUAL

## 2024-11-30 MED ORDER — DIPHENHYDRAMINE HCL 25 MG PO CAPS: 50.0000 mg | ORAL_CAPSULE | Freq: Once | ORAL | Status: DC

## 2024-11-30 MED ORDER — DILTIAZEM HCL 25 MG/5ML IV SOLN: 10.0000 mg | INTRAVENOUS | Status: DC | PRN

## 2024-11-30 MED ORDER — PREDNISONE 20 MG PO TABS: 50.0000 mg | ORAL_TABLET | Freq: Four times a day (QID) | ORAL | Status: DC

## 2024-11-30 MED ORDER — IOHEXOL 350 MG/ML SOLN
100.0000 mL | Freq: Once | INTRAVENOUS | Status: AC | PRN
  Administered 2024-11-30: 100 mL via INTRAVENOUS

## 2024-12-01 ENCOUNTER — Ambulatory Visit (HOSPITAL_COMMUNITY)
Admission: RE | Admit: 2024-12-01 | Discharge: 2024-12-01 | Disposition: A | Discharge status: Home / Self Care | Source: Ambulatory Visit | Attending: Cardiovascular Disease | Admitting: Cardiovascular Disease

## 2024-12-01 ENCOUNTER — Other Ambulatory Visit: Payer: Self-pay | Admitting: Cardiovascular Disease

## 2024-12-01 DIAGNOSIS — R931 Abnormal findings on diagnostic imaging of heart and coronary circulation: Secondary | ICD-10-CM | POA: Diagnosis not present

## 2024-12-01 NOTE — Progress Notes (Signed)
 CT FFR ordered.  Signed, Darryle DASEN. Barbaraann, MD, [facility]  [facility]  [address] [place], [place] [address] [phone]  8:40 AM

## 2024-12-03 ENCOUNTER — Telehealth: Payer: Self-pay | Admitting: Cardiovascular Disease

## 2024-12-03 ENCOUNTER — Encounter: Payer: Self-pay | Admitting: Cardiology

## 2024-12-03 NOTE — Telephone Encounter (Signed)
 Left detailed message per DPR that results have not been reviewed at this time.

## 2024-12-03 NOTE — Telephone Encounter (Signed)
 Patient is calling requesting a call to go over CT results

## 2024-12-06 ENCOUNTER — Other Ambulatory Visit: Payer: Self-pay

## 2024-12-06 DIAGNOSIS — E785 Hyperlipidemia, unspecified: Secondary | ICD-10-CM

## 2024-12-06 MED ORDER — ROSUVASTATIN CALCIUM 20 MG PO TABS: 20.0000 mg | ORAL_TABLET | Freq: Every day | ORAL | 3 refills | Status: AC

## 2024-12-06 MED ORDER — ASPIRIN 81 MG PO TBEC: 81.0000 mg | DELAYED_RELEASE_TABLET | Freq: Every day | ORAL | 3 refills | Status: AC

## 2024-12-06 NOTE — Telephone Encounter (Signed)
Pt following up. Please advise.

## 2024-12-06 NOTE — Telephone Encounter (Signed)
 Pt had a couple additional questions that were answered. Pt verbalized understanding and had no additional questions.

## 2024-12-08 ENCOUNTER — Other Ambulatory Visit: Payer: Self-pay | Admitting: Physician Assistant

## 2024-12-09 ENCOUNTER — Ambulatory Visit: Admitting: Physician Assistant

## 2024-12-09 ENCOUNTER — Encounter: Payer: Self-pay | Admitting: Physician Assistant

## 2024-12-09 VITALS — BP 134/70 | HR 81 | Temp 97.2°F | Resp 18 | Ht 62.0 in | Wt 216.0 lb

## 2024-12-09 DIAGNOSIS — I10 Essential (primary) hypertension: Secondary | ICD-10-CM | POA: Diagnosis not present

## 2024-12-09 DIAGNOSIS — I700 Atherosclerosis of aorta: Secondary | ICD-10-CM

## 2024-12-09 DIAGNOSIS — R0789 Other chest pain: Secondary | ICD-10-CM

## 2024-12-09 MED ORDER — METOPROLOL SUCCINATE ER 25 MG PO TB24: 25.0000 mg | ORAL_TABLET | Freq: Every day | ORAL | 1 refills | Status: DC

## 2024-12-09 NOTE — Progress Notes (Signed)
 "  Subjective:  Patient ID: Desiree Holmes, female    DOB: December 11, 1960  Age: 64 y.o. MRN: [id number]  Chief Complaint  Patient presents with   Hypertension    HPI Pt in today for follow up of hypertension.  She states she is taking losartan  100mg  and hydrochlorothiazide  25mg  qd - she denies edema or dyspnea but states for the past week she has been having intermittent chest pains.  She describes as a fleeting type pain or pressure across left anterior chest.   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  FFR tests low likelihood of hemodynamic significance.  It states on CTA results that cardiac cath recommended - I will reach out to cardiology office to clarify further evaluation for patient considering she is still having intermittent chest pains.  Message sent to Dr Monetta regarding these issues and he advised to start toprol  XL 25mg  qd for patient - I will do that and if her symptoms persist or worsen she is advised to follow up with his office     11/09/2024   10:33 AM 10/05/2024   10:19 AM 04/06/2024    3:19 PM 11/18/2023    8:14 AM 06/10/2023    2:14 PM  Depression screen PHQ 2/9  Decreased Interest 0 0 0 0 0  Down, Depressed, Hopeless 0 0 0 1 0  PHQ - 2 Score 0 0 0 1 0  Altered sleeping    1 2  Tired, decreased energy    1 1  Change in appetite    1 1  Feeling bad or failure about yourself     1 1  Trouble concentrating    0 0  Moving slowly or fidgety/restless    0 0  Suicidal thoughts    0 0  PHQ-9 Score    5  5   Difficult doing work/chores    Not difficult at all Not difficult at all     Data saved with a previous flowsheet row definition         06/10/2023    2:14 PM 11/18/2023    8:13 AM 03/31/2024    4:00 PM 10/05/2024    8:34 AM 11/09/2024   10:32 AM  Fall Risk  Falls in the past year? 0 0 0 0  0  Was there an injury with Fall? 0  0  0  0   0  Fall Risk Category Calculator 0 0 0 0  0  Patient at Risk for Falls Due to No Fall Risks No Fall Risks  No Fall Risks No Fall Risks  Fall risk Follow up Falls evaluation completed Falls evaluation completed  Falls evaluation completed Falls evaluation completed     Manually entered by patient   Data saved with a previous flowsheet row definition     ROS CONSTITUTIONAL: Negative for chills, fatigue, fever,  CARDIOVASCULAR: see HPI RESPIRATORY: Negative for recent cough and dyspnea.  GASTROINTESTINAL: Negative for abdominal pain, acid reflux symptoms, constipation, diarrhea, nausea and vomiting.  PSYCHIATRIC: Negative for sleep disturbance and to question depression screen.  Negative for depression, negative for anhedonia.   Current Medications[1]  Past Medical History:  Diagnosis Date   Allergy    Barrett's esophagus    GERD (gastroesophageal reflux disease)    H. pylori infection    History of colon polyps    Hypertension    Objective:  PHYSICAL EXAM:   BP 134/70   Pulse 81   Temp (!) 97.2 F (36.2 C) (Temporal)   Resp 18   Ht 5' 2 (1.575 m)   Wt 216 lb (98 kg)   SpO2 100%   BMI 39.51 kg/m    GEN: Well nourished, well developed, in no acute distress  Cardiac: RRR; no murmurs, rubs, or gallops,no edema - Respiratory:  normal respiratory rate and pattern with no distress - normal breath sounds with no rales, rhonchi, wheezes or rubs Neuro:  Alert and Oriented x 3, - CN II-Xii grossly intact Psych: euthymic mood, appropriate affect and demeanor   EKG normal Assessment & Plan Other chest pain As advised by Dr Monetta to start Toprol  XL 25mg  qd Orders:   EKG 12-Lead   metoprolol  succinate (TOPROL -XL) 25 MG 24 hr tablet; Take 1 tablet (25 mg total) by  mouth daily.  Aortic atherosclerosis Continue ASA 81mg  Continue crestor  20mg  qd    Benign hypertension Continue other meds as directed Orders:   metoprolol  succinate (TOPROL -XL) 25 MG 24 hr tablet; Take 1 tablet (25 mg total) by mouth daily.    Follow-up: Return in about 2 months (around 02/06/2025) for chronic fasting follow-up.  An After Visit Summary was printed and given to the patient.  Desiree R Siddhant Hashemi, PA-C [facility] [phone]     [1]  Current Outpatient Medications:    aspirin  EC 81 MG tablet, Take 1 tablet (81 mg total) by mouth daily. Swallow whole., Disp: 90 tablet, Rfl: 3   Biotin 1000 MCG CHEW, Chew by mouth daily., Disp: , Rfl:    Calcium  Carb-Cholecalciferol (SM CALCIUM  500/VITAMIN D3 PO), Take by mouth., Disp: , Rfl:    cetirizine (ZYRTEC) 10 MG tablet, Take 10 mg by mouth daily., Disp: , Rfl:    diltiazem  (CARDIZEM  CD) 240 MG 24 hr capsule, Take 1 capsule (240 mg total) by mouth daily., Disp: 30 capsule, Rfl: 2   estradiol  (ESTRACE ) 1 MG tablet, TAKE 1 TABLET(1 MG) BY MOUTH DAILY, Disp: 90 tablet, Rfl: 3   hydrochlorothiazide  (HYDRODIURIL ) 25 MG tablet, Take 1 tablet (25 mg total) by mouth daily., Disp: 90 tablet, Rfl: 3   losartan  (COZAAR ) 100 MG tablet, TAKE 1 TABLET(100 MG) BY MOUTH DAILY, Disp: 90 tablet, Rfl: 1   metoprolol  succinate (TOPROL -XL) 25 MG 24 hr tablet, Take 1 tablet (25 mg total) by mouth daily., Disp: 90 tablet, Rfl: 1   Omega-3 Fatty Acids (FISH OIL) 1000 MG CAPS, Take by mouth., Disp: , Rfl:    omeprazole  (PRILOSEC) 40 MG capsule, TAKE 1 CAPSULE BY MOUTH EVERY DAY, Disp: 90 capsule, Rfl: 1   rosuvastatin  (CRESTOR ) 20 MG tablet, Take 1 tablet (20 mg total) by mouth daily., Disp: 90 tablet, Rfl: 3   vitamin C (ASCORBIC ACID) 500 MG tablet, Take 500 mg by mouth daily., Disp: , Rfl:    Vitamin D , Ergocalciferol , (DRISDOL ) 1.25 MG (50000 UNIT) CAPS capsule, Take 1 capsule (50,000 Units total) by mouth every 7 (seven) days., Disp: 5  capsule, Rfl: 2   albuterol  (VENTOLIN  HFA) 108 (90 Base) MCG/ACT inhaler, Inhale 2 puffs into the lungs every 6 (six) hours as needed for wheezing or shortness of breath. (Patient not taking: Reported on  11/09/2024), Disp: 8 g, Rfl: 2   gentamicin  (GARAMYCIN ) 0.3 % ophthalmic solution, Place 2 drops into both eyes 3 (three) times daily., Disp: 5 mL, Rfl: 0  "

## 2024-12-10 ENCOUNTER — Ambulatory Visit: Admitting: Physician Assistant

## 2024-12-10 ENCOUNTER — Telehealth: Payer: Self-pay

## 2024-12-10 ENCOUNTER — Ambulatory Visit: Payer: Self-pay | Admitting: Physician Assistant

## 2024-12-10 DIAGNOSIS — R0789 Other chest pain: Secondary | ICD-10-CM

## 2024-12-10 DIAGNOSIS — I10 Essential (primary) hypertension: Secondary | ICD-10-CM

## 2024-12-10 MED ORDER — METOPROLOL SUCCINATE ER 25 MG PO TB24: 25.0000 mg | ORAL_TABLET | Freq: Every day | ORAL | 1 refills | Status: DC

## 2024-12-10 NOTE — Telephone Encounter (Signed)
 Medication was [facility] to [facility] [address] for patient patient made aware.      Needs Rx at [facility] but pharmacy does not have rx in stock. Pt states she [facility] but [facility]. Reassured her I would reach out to pharmacy and pt requested a call back and okay to LM.  [facility] and [facility] to [place]. $9.42 and will be ready in 30-40 minutes.  Returned call to [place] on identified VM. Left detailed message with info above. Encouraged her to call back with any further questions or concerns. Will route to clinic at this time for update. No further actions needed at this time. -MH, RN   Copied from CRM [id number]. Topic: Clinical - Medication Question >> Dec 10, 2024 11:02 AM Joesph NOVAK wrote: Reason for CRM: patient states pharmacy will not have her medication in today, they have to order it. She wants to know If she will be okay without it until they receive it? Please fu with her before eod!  metoprolol  succinate (TOPROL -XL) 25 MG 24 hr tablet

## 2024-12-10 NOTE — Telephone Encounter (Signed)
 [facility] [address] for patient patient made aware   Copied from CRM #[id number]. Topic: Clinical - Medication Question >> Dec 10, 2024 11:02 AM Joesph NOVAK wrote: Reason for CRM: patient states pharmacy will not have her medication in today, they have to order it. She wants to know If she will be okay without it until they receive it? Please fu with her before eod!  metoprolol  succinate (TOPROL -XL) 25 MG 24 hr tablet

## 2024-12-10 NOTE — Telephone Encounter (Signed)
 FYI Only or Action Required?: FYI only for provider: Home Care, prescription has been filled.  Patient was last seen in primary care on 12/09/2024 by Nicholaus Credit, PA-C.  Called Nurse Triage reporting Medication Refill.   Triage Disposition: Home Care  Patient/caregiver understands and will follow disposition?: Unsure - left voicemail notifying patient    Copied from CRM #[id number]. Topic: Clinical - Medication Question >> Dec 10, 2024 11:02 AM Joesph NOVAK wrote: Reason for CRM: patient states pharmacy will not have her medication in today, they have to order it. She wants to know If she will be okay without it until they receive it? Please fu with her before eod!  metoprolol  succinate (TOPROL -XL) 25 MG 24 hr tablet  >> Dec 10, 2024  4:05 PM Hadassah PARAS wrote: Pt's husband was at the pharmacy to pick up med and [facility] that [facility] did not [facility]. Office closed. Transferred to NT    Reason for Disposition  [1] Prescription prescribed recently is not at pharmacy AND [2] triager has access to patient's EMR AND [3] prescription is recorded in the EMR    [facility] pharmacy.  Answer Assessment - Initial Assessment Questions Patient calls to report [facility]  [facility] [address], husband went to pick up and pharmacy had not received.    Triage [facility] [phone] @ [phone] ET.  An[place]rer states medication order has been received, filled, and ready for pickup.   Triage RN attempted to call back patient with notification, went to voicemail, informed medication is ready for pickup and provided address of location.  Encouraged to call back with any questions or further needs.  Protocols used: Medication Refill and Renewal Call-A-AH

## 2024-12-13 ENCOUNTER — Other Ambulatory Visit: Payer: Self-pay

## 2024-12-13 ENCOUNTER — Telehealth: Payer: Self-pay

## 2024-12-13 DIAGNOSIS — I10 Essential (primary) hypertension: Secondary | ICD-10-CM

## 2024-12-13 DIAGNOSIS — R0789 Other chest pain: Secondary | ICD-10-CM

## 2024-12-13 MED ORDER — METOPROLOL SUCCINATE ER 25 MG PO TB24: 25.0000 mg | ORAL_TABLET | Freq: Every day | ORAL | 1 refills | Status: AC

## 2024-12-13 NOTE — Telephone Encounter (Signed)
 Spoke with patient made her aware that [facility] [address]. Will resend it    Copied from CRM #[id number]. Topic: Clinical - Medication Question >> Dec 10, 2024 11:02 AM Joesph NOVAK wrote: Reason for CRM: patient states pharmacy will not have her medication in today, they have to order it. She wants to know If she will be okay without it until they receive it? Please fu with her before eod!  metoprolol  succinate (TOPROL -XL) 25 MG 24 hr tablet

## 2024-12-13 NOTE — Telephone Encounter (Signed)
 Per another phone call notes in patient's chart:  Needs Rx at [facility] but pharmacy does not have rx in stock. Pt states [facility]. Reassured her I would reach out to pharmacy and pt requested a call back and okay to LM.  [facility] and [facility] to [place]. $9.42 and will be ready in 30-40 minutes.  Returned call to [place] on identified VM. Left detailed message with info above. Encouraged her to call back with any further questions or concerns. Will route to clinic at this time for update. No further actions needed at this time. -MH, RN

## 2024-12-17 ENCOUNTER — Encounter: Payer: Self-pay | Admitting: Physician Assistant

## 2024-12-17 ENCOUNTER — Ambulatory Visit: Admitting: Physician Assistant

## 2024-12-17 VITALS — BP 134/72 | HR 72 | Temp 97.8°F | Resp 18 | Ht 62.0 in | Wt 215.4 lb

## 2024-12-17 DIAGNOSIS — J069 Acute upper respiratory infection, unspecified: Secondary | ICD-10-CM | POA: Diagnosis not present

## 2024-12-17 LAB — POC COVID19 BINAXNOW: SARS Coronavirus 2 Ag: NEGATIVE

## 2024-12-17 MED ORDER — CEFDINIR 300 MG PO CAPS: 300.0000 mg | ORAL_CAPSULE | Freq: Two times a day (BID) | ORAL | 0 refills | Status: AC

## 2024-12-17 MED ORDER — PREDNISONE 20 MG PO TABS: 20.0000 mg | ORAL_TABLET | Freq: Two times a day (BID) | ORAL | 0 refills | Status: AC

## 2024-12-17 NOTE — Progress Notes (Signed)
 "  Acute Office Visit  Subjective:    Patient ID: Desiree Holmes, female    DOB: [birth year], 64 y.o.   MRN: [id number]  Chief Complaint  Patient presents with   Cough   Nasal Congestion   Ear Fullness    HPI: Patient is in today for complaints of sinus pressure, pnd, sore throat and cough.  Symptoms started on Wednesday.  She has not had a fever.  Has had malaise and mild ear pain as well  Current Medications[1]  Allergies[2]  ROS CONSTITUTIONAL: see HPI E/N/T: see HPI CARDIOVASCULAR: Negative for chest pain, dizziness, palpitations and pedal edema.  RESPIRATORY: see HPI GASTROINTESTINAL: Negative for abdominal pain, acid reflux symptoms, constipation, diarrhea, nausea and vomiting.       Objective:    PHYSICAL EXAM:   BP 134/72   Pulse 72   Temp 97.8 F (36.6 C) (Temporal)   Resp 18   Ht 5' 2 (1.575 m)   Wt 215 lb 6.4 oz (97.7 kg)   SpO2 99%   BMI 39.40 kg/m    GEN: Well nourished, well developed, in no acute distress  HEENT: Tms with fluid level noted and mild retraction - canals clear Oropharynx - erythema/pnd Cardiac: RRR; no murmurs,  Respiratory:  normal respiratory rate and pattern with no distress - normal breath sounds with no rales, rhonchi, wheezes or rubs  Office Visit on 12/17/2024  Component Date Value Ref Range Status   SARS Coronavirus 2 Ag 12/17/2024 Negative  Negative Final          Assessment & Plan Acute upper respiratory infection OTC decongestants as needed Orders:   POC COVID-19   predniSONE  (DELTASONE ) 20 MG tablet; Take 1 tablet (20 mg total) by mouth 2 (two) times daily with a meal.   cefdinir  (OMNICEF ) 300 MG capsule; Take 1 capsule (300 mg total) by mouth 2 (two) times daily.    Follow-up: Return if symptoms worsen or fail to improve.  An After Visit Summary was printed and given to the patient.  SARA R Ival Pacer, PA-C [facility] [phone]    [1]  Current Outpatient Medications:    aspirin   EC 81 MG tablet, Take 1 tablet (81 mg total) by mouth daily. Swallow whole., Disp: 90 tablet, Rfl: 3   Biotin 1000 MCG CHEW, Chew by mouth daily., Disp: , Rfl:    Calcium  Carb-Cholecalciferol (SM CALCIUM  500/VITAMIN D3 PO), Take by mouth., Disp: , Rfl:    cefdinir  (OMNICEF ) 300 MG capsule, Take 1 capsule (300 mg total) by mouth 2 (two) times daily., Disp: 20 capsule, Rfl: 0   cetirizine (ZYRTEC) 10 MG tablet, Take 10 mg by mouth daily., Disp: , Rfl:    diltiazem  (CARDIZEM  CD) 240 MG 24 hr capsule, Take 1 capsule (240 mg total) by mouth daily., Disp: 30 capsule, Rfl: 2   estradiol  (ESTRACE ) 1 MG tablet, TAKE 1 TABLET(1 MG) BY MOUTH DAILY, Disp: 90 tablet, Rfl: 3   hydrochlorothiazide  (HYDRODIURIL ) 25 MG tablet, Take 1 tablet (25 mg total) by mouth daily., Disp: 90 tablet, Rfl: 3   losartan  (COZAAR ) 100 MG tablet, TAKE 1 TABLET(100 MG) BY MOUTH DAILY, Disp: 90 tablet, Rfl: 1   metoprolol  succinate (TOPROL -XL) 25 MG 24 hr tablet, Take 1 tablet (25 mg total) by mouth daily., Disp: 90 tablet, Rfl: 1   Omega-3 Fatty Acids (FISH OIL) 1000 MG CAPS, Take by mouth., Disp: , Rfl:    omeprazole  (PRILOSEC) 40 MG capsule, TAKE 1 CAPSULE BY MOUTH EVERY  DAY, Disp: 90 capsule, Rfl: 1   predniSONE  (DELTASONE ) 20 MG tablet, Take 1 tablet (20 mg total) by mouth 2 (two) times daily with a meal., Disp: 10 tablet, Rfl: 0   rosuvastatin  (CRESTOR ) 20 MG tablet, Take 1 tablet (20 mg total) by mouth daily., Disp: 90 tablet, Rfl: 3   vitamin C (ASCORBIC ACID) 500 MG tablet, Take 500 mg by mouth daily., Disp: , Rfl:    Vitamin D , Ergocalciferol , (DRISDOL ) 1.25 MG (50000 UNIT) CAPS capsule, Take 1 capsule (50,000 Units total) by mouth every 7 (seven) days., Disp: 5 capsule, Rfl: 2 [2]  Allergies Allergen Reactions   Azelastine Itching and Anaphylaxis   Iodinated Contrast Media Other (See Comments)    Pt had 12/01/2023 with no premeds and no reaction    Sulfa Antibiotics Hives   "

## 2025-01-06 ENCOUNTER — Ambulatory Visit: Admitting: Physician Assistant

## 2025-01-07 ENCOUNTER — Ambulatory Visit: Admitting: Physician Assistant

## 2025-02-09 ENCOUNTER — Ambulatory Visit: Admitting: Physician Assistant

## 2025-02-21 ENCOUNTER — Ambulatory Visit: Admitting: Cardiology

## 2025-04-04 ENCOUNTER — Ambulatory Visit: Admitting: Nurse Practitioner
